# Patient Record
Sex: Female | Born: 1970
Health system: Southern US, Community
[De-identification: ages and names within clinical notes are randomized; demographics above are authoritative.]

## PROBLEM LIST (undated history)

## (undated) DIAGNOSIS — G4733 Obstructive sleep apnea (adult) (pediatric): Secondary | ICD-10-CM

## (undated) DIAGNOSIS — Z9889 Other specified postprocedural states: Secondary | ICD-10-CM

## (undated) DIAGNOSIS — M25562 Pain in left knee: Secondary | ICD-10-CM

## (undated) DIAGNOSIS — Z6841 Body Mass Index (BMI) 40.0 and over, adult: Secondary | ICD-10-CM

## (undated) DIAGNOSIS — Z8719 Personal history of other diseases of the digestive system: Secondary | ICD-10-CM

## (undated) DIAGNOSIS — R112 Nausea with vomiting, unspecified: Secondary | ICD-10-CM

## (undated) DIAGNOSIS — R5383 Other fatigue: Secondary | ICD-10-CM

## (undated) DIAGNOSIS — E559 Vitamin D deficiency, unspecified: Secondary | ICD-10-CM

## (undated) DIAGNOSIS — M25471 Effusion, right ankle: Secondary | ICD-10-CM

## (undated) DIAGNOSIS — I10 Essential (primary) hypertension: Secondary | ICD-10-CM

## (undated) DIAGNOSIS — E538 Deficiency of other specified B group vitamins: Secondary | ICD-10-CM

## (undated) DIAGNOSIS — F32A Depression, unspecified: Secondary | ICD-10-CM

## (undated) DIAGNOSIS — M25472 Effusion, left ankle: Secondary | ICD-10-CM

## (undated) DIAGNOSIS — R0602 Shortness of breath: Secondary | ICD-10-CM

## (undated) DIAGNOSIS — K219 Gastro-esophageal reflux disease without esophagitis: Secondary | ICD-10-CM

## (undated) DIAGNOSIS — M2550 Pain in unspecified joint: Secondary | ICD-10-CM

## (undated) DIAGNOSIS — M25561 Pain in right knee: Secondary | ICD-10-CM

## (undated) DIAGNOSIS — K829 Disease of gallbladder, unspecified: Secondary | ICD-10-CM

## (undated) DIAGNOSIS — K589 Irritable bowel syndrome without diarrhea: Secondary | ICD-10-CM

## (undated) DIAGNOSIS — M25475 Effusion, left foot: Secondary | ICD-10-CM

## (undated) DIAGNOSIS — L920 Granuloma annulare: Secondary | ICD-10-CM

## (undated) DIAGNOSIS — K5900 Constipation, unspecified: Secondary | ICD-10-CM

## (undated) DIAGNOSIS — R1310 Dysphagia, unspecified: Secondary | ICD-10-CM

## (undated) DIAGNOSIS — F419 Anxiety disorder, unspecified: Secondary | ICD-10-CM

## (undated) DIAGNOSIS — F329 Major depressive disorder, single episode, unspecified: Secondary | ICD-10-CM

## (undated) HISTORY — DX: Effusion, left ankle: M25.472

## (undated) HISTORY — DX: Obstructive sleep apnea (adult) (pediatric): G47.33

## (undated) HISTORY — DX: Pain in unspecified joint: M25.50

## (undated) HISTORY — DX: Gastro-esophageal reflux disease without esophagitis: K21.9

## (undated) HISTORY — DX: Essential (primary) hypertension: I10

## (undated) HISTORY — DX: Effusion, left foot: M25.475

## (undated) HISTORY — DX: Constipation, unspecified: K59.00

## (undated) HISTORY — DX: Shortness of breath: R06.02

## (undated) HISTORY — DX: Vitamin D deficiency, unspecified: E55.9

## (undated) HISTORY — DX: Pain in right knee: M25.562

## (undated) HISTORY — DX: Irritable bowel syndrome, unspecified: K58.9

## (undated) HISTORY — DX: Anxiety disorder, unspecified: F41.9

## (undated) HISTORY — DX: Deficiency of other specified B group vitamins: E53.8

## (undated) HISTORY — PX: BREAST SURGERY: SHX581

## (undated) HISTORY — PX: CYST REMOVAL HAND: SHX6279

## (undated) HISTORY — DX: Effusion, right ankle: M25.471

## (undated) HISTORY — DX: Dysphagia, unspecified: R13.10

## (undated) HISTORY — DX: Other fatigue: R53.83

## (undated) HISTORY — DX: Morbid (severe) obesity due to excess calories: E66.01

## (undated) HISTORY — DX: Disease of gallbladder, unspecified: K82.9

## (undated) HISTORY — DX: Body Mass Index (BMI) 40.0 and over, adult: Z684

## (undated) HISTORY — PX: KNEE ARTHROSCOPY: SUR90

## (undated) HISTORY — DX: Pain in right knee: M25.561

## (undated) HISTORY — PX: DIAGNOSTIC LAPAROSCOPY: SUR761

## (undated) HISTORY — DX: Depression, unspecified: F32.A

## (undated) HISTORY — DX: Major depressive disorder, single episode, unspecified: F32.9

## (undated) HISTORY — PX: DILATION AND CURETTAGE OF UTERUS: SHX78

---

## 2006-08-10 HISTORY — PX: BREAST REDUCTION SURGERY: SHX8

## 2009-04-17 ENCOUNTER — Ambulatory Visit: Payer: Self-pay | Admitting: Gastroenterology

## 2010-01-28 ENCOUNTER — Encounter: Admission: RE | Admit: 2010-01-28 | Discharge: 2010-01-28 | Payer: Self-pay | Admitting: Family Medicine

## 2010-03-10 HISTORY — PX: LAPAROSCOPIC CHOLECYSTECTOMY: SUR755

## 2010-03-22 ENCOUNTER — Observation Stay (HOSPITAL_COMMUNITY): Admission: EM | Admit: 2010-03-22 | Discharge: 2010-03-23 | Payer: Self-pay | Admitting: Pediatrics

## 2010-08-31 ENCOUNTER — Encounter: Payer: Self-pay | Admitting: General Surgery

## 2010-08-31 ENCOUNTER — Encounter: Payer: Self-pay | Admitting: Family Medicine

## 2010-10-24 LAB — DIFFERENTIAL
Basophils Relative: 0 % (ref 0–1)
Eosinophils Relative: 1 % (ref 0–5)
Lymphocytes Relative: 10 % — ABNORMAL LOW (ref 12–46)
Lymphs Abs: 1.2 10*3/uL (ref 0.7–4.0)
Monocytes Absolute: 1 10*3/uL (ref 0.1–1.0)
Neutrophils Relative %: 82 % — ABNORMAL HIGH (ref 43–77)

## 2010-10-24 LAB — CBC
MCH: 32.9 pg (ref 26.0–34.0)
MCV: 96.7 fL (ref 78.0–100.0)
Platelets: 215 10*3/uL (ref 150–400)
RBC: 4.16 MIL/uL (ref 3.87–5.11)
RDW: 13.6 % (ref 11.5–15.5)
WBC: 12.8 10*3/uL — ABNORMAL HIGH (ref 4.0–10.5)

## 2010-10-24 LAB — COMPREHENSIVE METABOLIC PANEL
Alkaline Phosphatase: 80 U/L (ref 39–117)
Calcium: 9.2 mg/dL (ref 8.4–10.5)
Chloride: 103 mEq/L (ref 96–112)
GFR calc Af Amer: >60 mL/min (ref >60)
GFR calc non Af Amer: >60 mL/min (ref >60)
Potassium: 3.4 mEq/L — ABNORMAL LOW (ref 3.5–5.1)

## 2010-10-24 LAB — URINE CULTURE

## 2010-10-24 LAB — URINALYSIS, ROUTINE W REFLEX MICROSCOPIC
Ketones, ur: >80 mg/dL — AB
Nitrite: NEGATIVE
Protein, ur: 30 mg/dL — AB
pH: 5.5 (ref 5.0–8.0)

## 2010-10-24 LAB — POCT PREGNANCY, URINE: Preg Test, Ur: NEGATIVE

## 2010-10-24 LAB — URINE MICROSCOPIC-ADD ON

## 2010-10-24 LAB — LIPASE, BLOOD: Lipase: 20 U/L (ref 11–59)

## 2011-02-10 ENCOUNTER — Other Ambulatory Visit: Payer: Self-pay | Admitting: Family Medicine

## 2011-02-10 DIAGNOSIS — Z1231 Encounter for screening mammogram for malignant neoplasm of breast: Secondary | ICD-10-CM

## 2011-02-18 ENCOUNTER — Encounter (INDEPENDENT_AMBULATORY_CARE_PROVIDER_SITE_OTHER): Payer: Self-pay | Admitting: General Surgery

## 2011-02-18 ENCOUNTER — Ambulatory Visit (INDEPENDENT_AMBULATORY_CARE_PROVIDER_SITE_OTHER): Payer: BC Managed Care – PPO | Admitting: General Surgery

## 2011-02-18 VITALS — BP 130/82 | Ht 67.0 in | Wt 246.1 lb

## 2011-02-18 DIAGNOSIS — IMO0002 Reserved for concepts with insufficient information to code with codable children: Secondary | ICD-10-CM

## 2011-02-18 DIAGNOSIS — M1710 Unilateral primary osteoarthritis, unspecified knee: Secondary | ICD-10-CM

## 2011-02-18 DIAGNOSIS — M179 Osteoarthritis of knee, unspecified: Secondary | ICD-10-CM

## 2011-02-18 DIAGNOSIS — IMO0001 Reserved for inherently not codable concepts without codable children: Secondary | ICD-10-CM

## 2011-02-18 DIAGNOSIS — E669 Obesity, unspecified: Secondary | ICD-10-CM

## 2011-02-18 DIAGNOSIS — M1990 Unspecified osteoarthritis, unspecified site: Secondary | ICD-10-CM | POA: Insufficient documentation

## 2011-02-18 NOTE — Patient Instructions (Addendum)
RETURN AFTER WORKUP COMPLETEDConstipation in Adults Constipation is having fewer than 2 bowel movements per week. Usually, the stools are hard. As we grow older, constipation is more common. If you try to fix constipation with laxatives, the problem may get worse. This is because laxatives taken over a long period of time make the colon muscles weaker. A low-fiber diet, not taking in enough fluids, and taking some medicines may make these problems worse. MEDICATIONS THAT MAY CAUSE CONSTIPATION  Water pills (diuretics).  Calcium channel blockers (used to control blood pressure and for the heart).   Certain pain medicines (narcotics).   Anticholinergics.  Anti-inflammatory agents.   Antacids that contain aluminum.   DISEASES THAT CONTRIBUTE TO CONSTIPATION  Diabetes.  Parkinson's disease.   Dementia.   Stroke.  Depression.   Illnesses that cause problems with salt and water metabolism.   HOME CARE INSTRUCTIONS  Constipation is usually best cared for without medicines. Increasing dietary fiber and eating more fruits and vegetables is the best way to manage constipation.   Slowly increase fiber intake to 25 to 38 grams per day. Whole grains, fruits, vegetables, and legumes are good sources of fiber. A dietitian can further help you incorporate high-fiber foods into your diet.   Drink enough water and fluids to keep your urine clear or pale yellow.   A fiber supplement may be added to your diet if you cannot get enough fiber from foods.   Increasing your activities also helps improve regularity.   Suppositories, as suggested by your caregiver, will also help. If you are using antacids, such as aluminum or calcium containing products, it will be helpful to switch to products containing magnesium if your caregiver says it is okay.   If you have been given a liquid injection (enema) today, this is only a temporary measure. It should not be relied on for treatment of longstanding  (chronic) constipation.   Stronger measures, such as magnesium sulfate, should be avoided if possible. This may cause uncontrollable diarrhea. Using magnesium sulfate may not allow you time to make it to the bathroom.  SEEK IMMEDIATE MEDICAL CARE IF:  There is bright red blood in the stool.   The constipation stays for more than 4 days.   There is belly (abdominal) or rectal pain.   You do not seem to be getting better.   You have any questions or concerns.  MAKE SURE YOU:  Understand these instructions.   Will watch your condition.   Will get help right away if you are not doing well or get worse.  Document Released: 04/24/2004 Document Re-Released: 10/21/2009 [facility] Patient Information 2011 [place], [place].

## 2011-02-18 NOTE — Progress Notes (Signed)
Subjective:     Patient ID: Lori Montgomery, female   DOB: 1971-04-23, 40 y.o.   MRN: [id number]    BP 130/82  Ht 5\' 7"  (1.702 m)  Wt 246 lb 2 oz (111.642 kg)  BMI 38.55 kg/m103    HPI 40 year old obese Caucasian female who I initially saw in May 2011 for consideration for weight loss surgery. By report her insurance provider at that time did not cover weight loss surgery procedures. She comes in today to discuss her ongoing problem with her weight control. She is still interested in laparoscopic adjustable gastric band surgery. She is still struggling with weight control. She most recently saw Dr. Mayford Knife for H CG shots as well as phentermine. She states that she lost about 26 pounds with that regimen; however, she regained all that weight back. Over the course of the past several years she has tried Weight Watchers, Doylene Bode, Slim fast with no long lasting success. She still struggles with bilateral ankle pain as well as lower back pain. She says that her joint pain has actually worsened since I saw her last year. She states that it is hard to exercise because of her joint pain. She has also undergone a laparoscopic cholecystectomy by Dr. gross for acute cholecystitis late last summer.  Past Medical History  Diagnosis Date  . Depression    Past Medical History  Diagnosis Date  . Depression   . Obesity (BMI 30-39.9)   . Degenerative joint disease    Past Surgical History  Procedure Date  . Breast reduction surgery 2008  . Laparoscopic cholecystectomy 03/2010    Dr Michaell Cowing   No Known Allergies  Current Outpatient Prescriptions  Medication Sig Dispense Refill  . ARIPiprazole (ABILIFY) 5 MG tablet Take 5 mg by mouth daily.        Marland Kitchen venlafaxine (EFFEXOR-XR) 150 MG 24 hr capsule Take 150 mg by mouth daily.        Marland Kitchen venlafaxine (EFFEXOR-XR) 75 MG 24 hr capsule Take 75 mg by mouth daily.          Review of Systems  Constitutional: Negative.   HENT: Negative.   Eyes: Negative.     Respiratory: Negative.  Negative for shortness of breath.   Cardiovascular: Negative.   Gastrointestinal: Positive for constipation (infrequent bowel movements). Negative for vomiting, abdominal pain, diarrhea and abdominal distention.  Genitourinary: Negative.   Musculoskeletal:       Right knee pain, b/l ankle pain  Skin: Negative.   Neurological: Negative.   Hematological: Negative.   Psychiatric/Behavioral: Negative.        Objective:   Physical Exam  Vitals reviewed. Constitutional: She is oriented to person, place, and time. She appears well-developed and well-nourished.  HENT:  Head: Normocephalic and atraumatic.  Eyes: Conjunctivae are normal. Pupils are equal, round, and reactive to light.  Neck: Normal range of motion. Neck supple. No tracheal deviation present. No thyromegaly present.  Cardiovascular: Normal rate, regular rhythm and normal heart sounds.   Pulmonary/Chest: Effort normal and breath sounds normal. She has no wheezes.  Abdominal: Soft. Bowel sounds are normal. She exhibits no distension. There is no tenderness. No hernia.    Musculoskeletal: Normal range of motion.  Neurological: She is alert and oriented to person, place, and time.  Skin: Skin is warm and dry. No rash noted. No erythema.  Psychiatric: She has a normal mood and affect. Her behavior is normal. Judgment and thought content normal.   Data reviewed: I reviewed my office  note from Dec 25, 2009. I reviewed Dr. gross his operative note from March 22, 2010. Also reviewed his last office note from August when he 4 2011. Also reviewed her initial referring letter for weight loss surgery in February 2011    Assessment:     40 year old obese Caucasian female with depression, degenerative joint disease, and history of dyslipidemia who is interested in laparoscopic adjustable gastric band surgery    Plan:     We discussed laparoscopic adjustable gastric band surgery in detail. We discussed the  procedure in detail. We discussed the risk and benefits of surgery including but not limited to bleeding, infection, injury to surrounding structures, blood clot formation, wound complications, band slippage, band erosion, worsening gastroesophageal reflux disease, esophageal dilatation, failure to loose weight, and port complications. We discussed that in order to be successful with weight loss surgery that she would also have to change her diet as well as her exercise level. We discussed that  it is essential to followup on a monthly basis after laparoscopic adjustable gastric band surgery to determine if she needs an adjustment to her band to help her with weight loss. We discussed that the typical expected weight loss with this type of surgery is 40-60% of excess body weight. We also discussed the distinct possibility that it is possible that she may lose less than 40% of her excess body weight. We also discussed the preoperative workup process which includes laboratory evaluation, upper GI, nutrition consultation, a referral to the psychologist, and a mammogram. Her mammogram is currently scheduled for next week. Once we get all these results we will submit her paperwork to her insurance agency for approval. We will base our followup on the outcome of her insurance agency's response.

## 2011-02-20 LAB — CBC
HCT: 40.2 % (ref 36.0–46.0)
Hemoglobin: 13.2 g/dL (ref 12.0–15.0)
Platelets: 218 10*3/uL (ref 150–400)
WBC: 6.1 10*3/uL (ref 4.0–10.5)

## 2011-02-20 LAB — LIPID PANEL
Cholesterol: 215 mg/dL — ABNORMAL HIGH (ref 0–200)
HDL: 55 mg/dL (ref >39)
Total CHOL/HDL Ratio: 3.9 Ratio
Triglycerides: 122 mg/dL (ref <150)

## 2011-02-20 LAB — COMPREHENSIVE METABOLIC PANEL
Alkaline Phosphatase: 54 U/L (ref 39–117)
BUN: 15 mg/dL (ref 6–23)
CO2: 28 mEq/L (ref 19–32)
Calcium: 9.5 mg/dL (ref 8.4–10.5)
Creat: 0.79 mg/dL (ref 0.50–1.10)
Potassium: 4.3 mEq/L (ref 3.5–5.3)
Sodium: 141 mEq/L (ref 135–145)
Total Protein: 6.3 g/dL (ref 6.0–8.3)

## 2011-02-23 ENCOUNTER — Ambulatory Visit
Admission: RE | Admit: 2011-02-23 | Discharge: 2011-02-23 | Disposition: A | Discharge status: Home / Self Care | Payer: BC Managed Care – PPO | Source: Ambulatory Visit | Attending: Family Medicine | Admitting: Family Medicine

## 2011-02-23 DIAGNOSIS — Z1231 Encounter for screening mammogram for malignant neoplasm of breast: Secondary | ICD-10-CM

## 2011-02-27 ENCOUNTER — Ambulatory Visit (HOSPITAL_COMMUNITY)
Admission: RE | Admit: 2011-02-27 | Discharge: 2011-02-27 | Disposition: A | Discharge status: Home / Self Care | Payer: BC Managed Care – PPO | Source: Ambulatory Visit | Attending: General Surgery | Admitting: General Surgery

## 2011-02-27 DIAGNOSIS — M545 Low back pain, unspecified: Secondary | ICD-10-CM | POA: Insufficient documentation

## 2011-02-27 DIAGNOSIS — IMO0001 Reserved for inherently not codable concepts without codable children: Secondary | ICD-10-CM

## 2011-02-27 DIAGNOSIS — K449 Diaphragmatic hernia without obstruction or gangrene: Secondary | ICD-10-CM | POA: Insufficient documentation

## 2011-02-27 DIAGNOSIS — F3289 Other specified depressive episodes: Secondary | ICD-10-CM | POA: Insufficient documentation

## 2011-02-27 DIAGNOSIS — F329 Major depressive disorder, single episode, unspecified: Secondary | ICD-10-CM | POA: Insufficient documentation

## 2011-02-27 DIAGNOSIS — M1990 Unspecified osteoarthritis, unspecified site: Secondary | ICD-10-CM | POA: Insufficient documentation

## 2011-02-27 DIAGNOSIS — Z6838 Body mass index (BMI) 38.0-38.9, adult: Secondary | ICD-10-CM | POA: Insufficient documentation

## 2011-03-04 DIAGNOSIS — F32A Depression, unspecified: Secondary | ICD-10-CM | POA: Insufficient documentation

## 2011-03-04 DIAGNOSIS — F329 Major depressive disorder, single episode, unspecified: Secondary | ICD-10-CM | POA: Insufficient documentation

## 2011-03-04 DIAGNOSIS — F419 Anxiety disorder, unspecified: Secondary | ICD-10-CM | POA: Insufficient documentation

## 2011-03-13 ENCOUNTER — Encounter: Payer: Self-pay | Admitting: *Deleted

## 2011-03-13 ENCOUNTER — Encounter: Payer: BC Managed Care – PPO | Attending: General Surgery | Admitting: *Deleted

## 2011-03-13 DIAGNOSIS — Z01818 Encounter for other preprocedural examination: Secondary | ICD-10-CM | POA: Insufficient documentation

## 2011-03-13 DIAGNOSIS — Z713 Dietary counseling and surveillance: Secondary | ICD-10-CM | POA: Insufficient documentation

## 2011-03-13 NOTE — Progress Notes (Signed)
  Patient was seen on 03/13/2011 for Pre-Operative LAGB Nutrition Assessment. Assessment and letter of approval faxed to [facility] Bariatric Surgery Program coordinator on 03/13/2011.    Patient to call for Pre-Op and Post-Op Nutrition Education at [facility] when surgery is scheduled.

## 2011-07-23 DIAGNOSIS — E669 Obesity, unspecified: Secondary | ICD-10-CM | POA: Insufficient documentation

## 2011-07-23 DIAGNOSIS — E66811 Obesity, class 1: Secondary | ICD-10-CM | POA: Insufficient documentation

## 2011-08-03 DIAGNOSIS — M25512 Pain in left shoulder: Secondary | ICD-10-CM | POA: Insufficient documentation

## 2012-01-27 ENCOUNTER — Ambulatory Visit (INDEPENDENT_AMBULATORY_CARE_PROVIDER_SITE_OTHER): Payer: BC Managed Care – PPO | Admitting: General Surgery

## 2012-02-05 ENCOUNTER — Ambulatory Visit (INDEPENDENT_AMBULATORY_CARE_PROVIDER_SITE_OTHER): Payer: BC Managed Care – PPO | Admitting: General Surgery

## 2012-02-05 ENCOUNTER — Encounter (INDEPENDENT_AMBULATORY_CARE_PROVIDER_SITE_OTHER): Payer: Self-pay | Admitting: General Surgery

## 2012-02-05 VITALS — BP 132/94 | HR 105 | Temp 97.8°F | Resp 16 | Ht 67.0 in | Wt 263.2 lb

## 2012-02-05 DIAGNOSIS — Z6841 Body Mass Index (BMI) 40.0 and over, adult: Secondary | ICD-10-CM

## 2012-02-08 LAB — CBC WITH DIFFERENTIAL/PLATELET
Basophils Absolute: 0 K/uL (ref 0.0–0.1)
Basophils Relative: 0 % (ref 0–1)
Eosinophils Absolute: 0.6 K/uL (ref 0.0–0.7)
Eosinophils Relative: 9 % — ABNORMAL HIGH (ref 0–5)
HCT: 40.8 % (ref 36.0–46.0)
Hemoglobin: 13.8 g/dL (ref 12.0–15.0)
Lymphocytes Relative: 35 % (ref 12–46)
Lymphs Abs: 2.2 K/uL (ref 0.7–4.0)
MCH: 31.2 pg (ref 26.0–34.0)
MCHC: 33.8 g/dL (ref 30.0–36.0)
MCV: 92.1 fL (ref 78.0–100.0)
Monocytes Absolute: 0.4 K/uL (ref 0.1–1.0)
Monocytes Relative: 6 % (ref 3–12)
Neutro Abs: 3.1 K/uL (ref 1.7–7.7)
Neutrophils Relative %: 50 % (ref 43–77)
Platelets: 239 K/uL (ref 150–400)
RBC: 4.43 MIL/uL (ref 3.87–5.11)
RDW: 13.3 % (ref 11.5–15.5)
WBC: 6.3 K/uL (ref 4.0–10.5)

## 2012-02-08 LAB — TSH: TSH: 2.77 u[IU]/mL (ref 0.350–4.500)

## 2012-02-08 NOTE — Progress Notes (Signed)
Patient ID: Lori Montgomery, female   DOB: 03-04-1971, 41 y.o.   MRN: [id number]  Chief Complaint  Patient presents with  . Other    new bariatric- lap band initial    HPI Lori Montgomery is a 41 y.o. female.   HPI 41 year old Caucasian female comes back in to rediscuss weight loss surgery. I initially saw her back in 2011 for consideration of laparoscopic adjustable gastric band surgery. The patient states that she's been in her usual state of health. However her primary care physician did notice a lump in her left breast last week and has requested that her yearly mammogram be moved up. She is currently waiting to hear what is going to be scheduled for. She has no breast complaints per se.  Otherwise she denies any significant medical changes since I saw her last summer in July. Over the years she has tried many different things for weight loss all of which have been unsuccessful for the long-term. She has tried Doylene Bode, [employer] Watchers, Phentermine, as well as hCG injections.  Past Medical History  Diagnosis Date  . Depression   . Obesity (BMI 30-39.9)   . Degenerative joint disease   . Gout     Past Surgical History  Procedure Date  . Breast reduction surgery 2008  . Laparoscopic cholecystectomy 03/2010    Dr Michaell Cowing  . Knee arthroscopy     right knee    Family History  Problem Relation Age of Onset  . Diabetes Mother   . Lung cancer Maternal Grandmother   . Breast cancer Maternal Aunt   . Diabetes Father   . Heart disease Paternal Grandmother   . Heart disease Paternal Grandfather     Social History History  Substance Use Topics  . Smoking status: Former Smoker    Quit date: 01/23/2011  . Smokeless tobacco: Not on file  . Alcohol Use: Yes     rarely    No Known Allergies  Current Outpatient Prescriptions  Medication Sig Dispense Refill  . ARIPiprazole (ABILIFY) 5 MG tablet Take 5 mg by mouth daily.        . hydrochlorothiazide (MICROZIDE) 12.5 MG capsule  daily.      Marland Kitchen venlafaxine (EFFEXOR-XR) 150 MG 24 hr capsule Take 150 mg by mouth daily.        Marland Kitchen venlafaxine (EFFEXOR-XR) 75 MG 24 hr capsule Take 75 mg by mouth daily.          Review of Systems Review of Systems  Constitutional: Negative for fever, chills and unexpected weight change.  HENT: Negative for hearing loss, congestion, sore throat, trouble swallowing and voice change.   Eyes: Negative for visual disturbance.  Respiratory: Negative for cough, shortness of breath and wheezing.        No CP, SOB, DOE, PND, orthopnea  Cardiovascular: Negative for chest pain, palpitations and leg swelling.       Some intermittent swelling in feet  Gastrointestinal: Negative for nausea, vomiting, abdominal pain, diarrhea, constipation, blood in stool, abdominal distention and anal bleeding.  Genitourinary: Negative for hematuria, vaginal bleeding and difficulty urinating.  Musculoskeletal: Negative for arthralgias.       Rt knee pain  Skin: Negative for rash and wound.  Neurological: Negative for seizures, syncope, light-headedness, numbness and headaches.  Hematological: Negative for adenopathy. Does not bruise/bleed easily.  Psychiatric/Behavioral: Negative for confusion.       Takes antidepressant    Blood pressure 132/94, pulse 105, temperature 97.8 F (36.6 C), temperature  source Temporal, resp. rate 16, height 5\' 7"  (1.702 m), weight 263 lb 3.2 oz (119.387 kg). Wt last July 246 pounds  Physical Exam Physical Exam  Vitals reviewed. Constitutional: She is oriented to person, place, and time. She appears well-developed and well-nourished. No distress.  HENT:  Head: Normocephalic and atraumatic.  Right Ear: External ear normal.  Eyes: Conjunctivae are normal. No scleral icterus.  Neck: Normal range of motion. Neck supple. No tracheal deviation present. No thyromegaly present.  Cardiovascular: Normal rate, regular rhythm, normal heart sounds and intact distal pulses.   Pulmonary/Chest:  Effort normal and breath sounds normal. No respiratory distress. She has no wheezes. Right breast exhibits no inverted nipple, no nipple discharge, no skin change and no tenderness. Left breast exhibits no inverted nipple, no nipple discharge, no skin change and no tenderness. Breasts are symmetrical.       Has typical fibrocystic breast exam in L breast - area of thickness in Left outer quadrant  Abdominal: Soft. Bowel sounds are normal. She exhibits no distension. There is no tenderness. There is no rebound.    Musculoskeletal: Normal range of motion. She exhibits no edema and no tenderness.       Rt knee surgical scar  Lymphadenopathy:    She has no cervical adenopathy.  Neurological: She is alert and oriented to person, place, and time. She exhibits normal muscle tone.  Skin: Skin is warm and dry. No rash noted. She is not diaphoretic. No erythema.  Psychiatric: She has a normal mood and affect. Her behavior is normal. Judgment and thought content normal.    Data Reviewed My previous office notes from 02/18/11 & 12/2009 [facility] 02/2011: small sliding hiatal hernia Labs from 02/2011- nml cmet, cbc, tsh, LDL 136, total chol 215  Assessment    Morbid obesity BMI 41.22 Elevated Cholesterol Hypertriglyceridemia DJD  - Rt knee    Plan    The patient meets weight loss surgery criteria. I think the patient would be an acceptable candidate for Laparoscopic adjustable gastric band placement.  We re-discussed laparoscopic adjustable gastric banding. The patient was given Agricultural engineer. We discussed the risk and benefits of surgery including but not limited to bleeding, infection, injury to surrounding structures, blood clot formation such as deep venous thrombosis or pulmonary embolism, need to convert to an open procedure, band slippage, band erosion, failure to loose weight, port complications (leak or flippage), potential need for reoperative surgery, esophageal dilatation, worsening  reflux, and vitamin deficiencies. We discussed the typical post operative recovery course. We discussed that their postoperative diet will be modified for several weeks. We specifically talked about the need to be on a liquid diet for one to 2 weeks after surgery. We also discussed the typical postoperative course with a laparoscopic adjustable gastric band and the need for frequent postoperative visits to assess the volume status of the band.  We discussed the typical expected weight loss with a laparoscopic adjustable gastric band. I explained to the patient that they can expect to lose 40-60% of their excess body weight if they are compliant with their postoperative instructions. However I did explain that some patients loose less than 40% and some patients lose more than 60% of their excess body weight.  I explained that the likelihood of improvement in their obesity is good.  I explained to the patient that we will start our evaluation process which includes labs, EKG, CXR. Since the patient has already been cleared the psychiatrist & the nutritionist last year -  will not repeat these consultations. The patient will need an up to date mammogram which she is scheduled to get soon. If she is approved, she will need to see the nutritionist for preop teaching.   [name]. Andrey Campanile, MD, FACS General, Bariatric, & Minimally Invasive Surgery [facility], [place]          [facility] M 02/08/2012, 2:00 PM

## 2012-02-09 LAB — COMPREHENSIVE METABOLIC PANEL
AST: 15 U/L (ref 0–37)
Albumin: 4 g/dL (ref 3.5–5.2)
Alkaline Phosphatase: 56 U/L (ref 39–117)
BUN: 12 mg/dL (ref 6–23)
CO2: 24 mEq/L (ref 19–32)
Calcium: 9.2 mg/dL (ref 8.4–10.5)
Chloride: 104 mEq/L (ref 96–112)
Creat: 0.72 mg/dL (ref 0.50–1.10)
Sodium: 139 mEq/L (ref 135–145)
Total Bilirubin: 0.4 mg/dL (ref 0.3–1.2)
Total Protein: 6.7 g/dL (ref 6.0–8.3)

## 2012-02-09 LAB — LIPID PANEL: Cholesterol: 205 mg/dL — ABNORMAL HIGH (ref 0–200)

## 2012-02-10 ENCOUNTER — Other Ambulatory Visit (INDEPENDENT_AMBULATORY_CARE_PROVIDER_SITE_OTHER): Payer: Self-pay | Admitting: General Surgery

## 2012-03-03 ENCOUNTER — Encounter (INDEPENDENT_AMBULATORY_CARE_PROVIDER_SITE_OTHER): Payer: Self-pay

## 2012-08-11 ENCOUNTER — Encounter (INDEPENDENT_AMBULATORY_CARE_PROVIDER_SITE_OTHER): Payer: Self-pay | Admitting: General Surgery

## 2012-08-11 ENCOUNTER — Ambulatory Visit (INDEPENDENT_AMBULATORY_CARE_PROVIDER_SITE_OTHER): Payer: BC Managed Care – PPO | Admitting: General Surgery

## 2012-08-11 VITALS — BP 118/72 | HR 76 | Resp 18 | Ht 67.75 in | Wt 264.0 lb

## 2012-08-11 DIAGNOSIS — F3289 Other specified depressive episodes: Secondary | ICD-10-CM

## 2012-08-11 DIAGNOSIS — Z6841 Body Mass Index (BMI) 40.0 and over, adult: Secondary | ICD-10-CM

## 2012-08-11 DIAGNOSIS — M109 Gout, unspecified: Secondary | ICD-10-CM

## 2012-08-11 DIAGNOSIS — F329 Major depressive disorder, single episode, unspecified: Secondary | ICD-10-CM

## 2012-08-11 NOTE — Patient Instructions (Signed)
We will get a copy of your labs from Dr Kathrynn Running Try to get a copy of your knee surgery operative note

## 2012-08-11 NOTE — Progress Notes (Addendum)
Patient ID: Lori Montgomery, female   DOB: August 20, 1970, 42 y.o.   MRN: [id number]  Chief Complaint  Patient presents with  . Weight Loss Surgery    lap band     HPI Lori Montgomery is a 42 y.o. female.   HPI 42 year old Caucasian female who is morbidly obese comes in to rediscuss laparoscopic adjustable gastric band surgery. I have seen on several occasions to discuss weight loss surgery. Unfortunately she has been denied by her insurance provider. She states that she has had worsening right knee as well as worsening left ankle pain since she was seen in the summer. She states that her primary care physician instructed her to stop exercising and going to the gym because of the worsening musculoskeletal pain. She also states that she had a recent physical in mid December and was told that her cholesterol level is worsening. Last summer it was 29 and now it is 218. She denies any trips to the emergency room or hospitalization since last summer. She denies any abdominal pain, nausea, vomiting, reflux. She denies any diarrhea or constipation. She denies any chest pain or shortness of breath. She has still been following a low calorie diet but still has not lost any weight since she was seen last summer. Past Medical History  Diagnosis Date  . Depression   . Degenerative joint disease   . Gout   . Morbid obesity with BMI of 40.0-44.9, adult     Past Surgical History  Procedure Date  . Breast reduction surgery 2008  . Laparoscopic cholecystectomy 03/2010    Dr Michaell Cowing  . Knee arthroscopy     right knee    Family History  Problem Relation Age of Onset  . Diabetes Mother   . Lung cancer Maternal Grandmother   . Breast cancer Maternal Aunt   . Diabetes Father   . Heart disease Paternal Grandmother   . Heart disease Paternal Grandfather     Social History History  Substance Use Topics  . Smoking status: Former Smoker    Quit date: 01/23/2011  . Smokeless tobacco: Not on file  . Alcohol  Use: Yes     Comment: rarely    No Known Allergies  Current Outpatient Prescriptions  Medication Sig Dispense Refill  . ARIPiprazole (ABILIFY) 5 MG tablet Take 5 mg by mouth daily.        Marland Kitchen venlafaxine (EFFEXOR-XR) 150 MG 24 hr capsule Take 150 mg by mouth daily.        Marland Kitchen venlafaxine (EFFEXOR-XR) 75 MG 24 hr capsule Take 75 mg by mouth daily.          Review of Systems Review of Systems  Constitutional: Negative for fever, chills and unexpected weight change.  HENT: Negative for hearing loss, congestion, sore throat, trouble swallowing and voice change.   Eyes: Negative for visual disturbance.  Respiratory: Negative for cough and wheezing.   Cardiovascular: Negative for chest pain, palpitations and leg swelling.  Gastrointestinal: Negative for nausea, vomiting, abdominal pain, diarrhea, constipation, blood in stool, abdominal distention and anal bleeding.       Takes correctal daily  Genitourinary: Negative for hematuria, vaginal bleeding and difficulty urinating.  Musculoskeletal: Negative for arthralgias.       Rt knee pain and left ankle pain  Skin: Negative for rash and wound.  Neurological: Negative for seizures, syncope and headaches.  Hematological: Negative for adenopathy. Does not bruise/bleed easily.  Psychiatric/Behavioral: Negative for confusion.    Blood pressure 118/72, pulse  76, resp. rate 18, height 5' 7.75" (1.721 m), weight 264 lb (119.75 kg).  Physical Exam Physical Exam  Vitals reviewed. Constitutional: She is oriented to person, place, and time. She appears well-developed and well-nourished. No distress.  HENT:  Head: Normocephalic and atraumatic.  Right Ear: External ear normal.  Left Ear: External ear normal.  Eyes: Conjunctivae normal are normal. No scleral icterus.  Neck: Normal range of motion. Neck supple. No tracheal deviation present. No thyromegaly present.  Cardiovascular: Normal rate and regular rhythm.   Pulmonary/Chest: Effort normal and  breath sounds normal. No respiratory distress. She has no wheezes.  Abdominal: Soft. Bowel sounds are normal. She exhibits no distension. There is no tenderness. There is no rebound.       Well healed trocar incisions  Musculoskeletal: She exhibits no edema and no tenderness.       Right knee: She exhibits bony tenderness.  Neurological: She is alert and oriented to person, place, and time. She exhibits normal muscle tone.  Skin: Skin is warm and dry. No rash noted. She is not diaphoretic. No erythema. No pallor.  Psychiatric: She has a normal mood and affect. Her behavior is normal. Judgment and thought content normal.    Data Reviewed My office notes Labs from July 2013 total chol 205; LDL 134 Labs from 07/25/12 - nml cbc, cmet; total chol 218, LDL now 146  Assessment    Morbid obesity BMI 40.44 Hypercholesterolemia Hyperlipidemia Depression Joint pain (Right knee and Left ankle)    Plan    I still believe this patient would benefit from weight loss surgery. She has documented weight demonstrating her morbid obesity. Despite numerous attempts at sustained weight loss which has included dietary programs such as Doylene Bode and Weight Watchers as well as medications such as phentermine and hCG injection she has not lost any weight and/or sustained weight loss. She has worsening joint pain as well as worsening cholesterol levels that will best be managed by weight loss surgery. Her joint pain is worsening to the point that she is no longer able to exercise regularly without suffering severe joint pain in her right knee and left ankle. The best treatment course for her joint pain will be weight loss which will be best achieved through weight loss surgery.We will obtain her most recent labs from her physical and order any of the remaining labs. We briefly rediscuss laparoscopic adjustable gastric band surgery. All of her questions were asked and answered.  [name]. Andrey Campanile, MD, FACS General,  Bariatric, & Minimally Invasive Surgery [facility], [place]        [facility] M 08/11/2012, 1:50 PM

## 2012-08-30 ENCOUNTER — Other Ambulatory Visit (INDEPENDENT_AMBULATORY_CARE_PROVIDER_SITE_OTHER): Payer: Self-pay | Admitting: General Surgery

## 2012-09-15 ENCOUNTER — Encounter: Payer: BC Managed Care – PPO | Attending: General Surgery | Admitting: *Deleted

## 2012-09-15 VITALS — Ht 67.0 in | Wt 272.2 lb

## 2012-09-15 DIAGNOSIS — Z01818 Encounter for other preprocedural examination: Secondary | ICD-10-CM | POA: Insufficient documentation

## 2012-09-15 DIAGNOSIS — Z713 Dietary counseling and surveillance: Secondary | ICD-10-CM | POA: Insufficient documentation

## 2012-09-16 ENCOUNTER — Encounter: Payer: Self-pay | Admitting: *Deleted

## 2012-09-16 NOTE — Progress Notes (Addendum)
Bariatric Class:  Appt start time: 1730 end time:  1830.  Pre-Operative Nutrition Class  Patient was seen on 09/15/12 for Pre-Operative Bariatric Surgery Education at [facility].   Surgery date: 10/04/12 Surgery type: LAGB [facility] (03/13/11)  Weight today: 272.2 lbs BMI: 42.6 kg/m^2  Samples given per MNT protocol: 1 each Bariatric Advantage Multivitamin Lot # [id number]; Exp: 06/15  Bariatric Advantage Calcium Citrate Lot # [id number]; Exp:10/15  Bariatric Advantage Sublingual B12 Lot # [id number]; Exp:10/15  Celebrate Vitamins Multivitamin Complete (w/ iron) Lot # [id number]; Exp: 11/14  Celebrate Vitamins Multivitamin Lot # [id number]; Exp: 07/15  Celebrate Vitamins Iron + C (30 mg) Lot # [id number]; Exp: 09/15  Celebrate Vitamins Calcium Citrate Lot # [id number]; Exp: 09/15  Unjury Protein Powder Lot # [id number]; Exp: 03/15  Premier Protein Shake Lot # [id number]; Exp: 06/17/13  The following the learning objective met by the patient during this course:  Identifies Pre-Op Dietary Goals and will begin 2 weeks pre-operatively  Identifies appropriate sources of fluids and proteins   States protein recommendations and appropriate sources pre and post-operatively  Identifies Post-Operative Dietary Goals and will follow for 2 weeks post-operatively  Identifies appropriate multivitamin and calcium sources  Describes the need for physical activity post-operatively and will follow MD recommendations  States when to call healthcare provider regarding medication questions or post-operative complications  Handouts given during class include:  Pre-Op Bariatric Surgery Diet Handout  Protein Shake Handout  Post-Op Bariatric Surgery Nutrition Handout  BELT Program Information Flyer  Support Group Information Flyer  [facility] Bariatric Supplements Price List  Follow-Up Plan: Patient will follow-up at [facility] 2 weeks post  operatively for diet advancement per MD.

## 2012-09-16 NOTE — Patient Instructions (Signed)
Follow:   Pre-Op Diet per MD 2 weeks prior to surgery  Phase 2- Liquids (clear/full) 2 weeks after surgery  Vitamin/Mineral/Calcium guidelines for purchasing bariatric supplements  Exercise guidelines pre and post-op per MD  Follow-up at [facility] in 2 weeks post-op for diet advancement. Contact Shanera Meske as needed with questions/concerns. 

## 2012-09-20 ENCOUNTER — Encounter (HOSPITAL_COMMUNITY): Payer: Self-pay | Admitting: Pharmacy Technician

## 2012-09-21 ENCOUNTER — Telehealth (INDEPENDENT_AMBULATORY_CARE_PROVIDER_SITE_OTHER): Payer: Self-pay | Admitting: General Surgery

## 2012-09-21 NOTE — Telephone Encounter (Signed)
Patient called stating she was transferred to triage office because she wanted to know if she was supposed to keep her appointment tomorrow due to the pending weather. Patient stated that she wanted to know if our office would be open or not. I advised the patient that we have not been advised yet, but that I would check with Dr. Andrey Campanile or his assistant and she would be called back. I called Jade to advise of the patient concern. She advised the patient keep the appointment.  I called the patient back and advised her that as of right now the clinic will be open and for her to keep the appointment as scheduled. Patient agreed.

## 2012-09-22 ENCOUNTER — Ambulatory Visit (INDEPENDENT_AMBULATORY_CARE_PROVIDER_SITE_OTHER): Payer: BC Managed Care – PPO | Admitting: General Surgery

## 2012-09-26 ENCOUNTER — Encounter (HOSPITAL_COMMUNITY)
Admission: RE | Admit: 2012-09-26 | Discharge: 2012-09-26 | Disposition: A | Discharge status: Home / Self Care | Payer: BC Managed Care – PPO | Source: Ambulatory Visit | Attending: General Surgery | Admitting: General Surgery

## 2012-09-26 ENCOUNTER — Encounter (HOSPITAL_COMMUNITY): Payer: Self-pay

## 2012-09-26 HISTORY — DX: Personal history of other diseases of the digestive system: Z87.19

## 2012-09-26 HISTORY — DX: Other specified postprocedural states: Z98.890

## 2012-09-26 HISTORY — DX: Nausea with vomiting, unspecified: R11.2

## 2012-09-26 LAB — COMPREHENSIVE METABOLIC PANEL
BUN: 16 mg/dL (ref 6–23)
CO2: 26 mEq/L (ref 19–32)
Calcium: 9.1 mg/dL (ref 8.4–10.5)
Chloride: 99 mEq/L (ref 96–112)
Creatinine, Ser: 0.7 mg/dL (ref 0.50–1.10)
GFR calc Af Amer: >90 mL/min (ref >90)
GFR calc non Af Amer: >90 mL/min (ref >90)
Total Bilirubin: 0.3 mg/dL (ref 0.3–1.2)

## 2012-09-26 LAB — CBC WITH DIFFERENTIAL/PLATELET
Eosinophils Relative: 6 % — ABNORMAL HIGH (ref 0–5)
HCT: 41.9 % (ref 36.0–46.0)
Hemoglobin: 13.6 g/dL (ref 12.0–15.0)
Lymphocytes Relative: 31 % (ref 12–46)
MCHC: 32.5 g/dL (ref 30.0–36.0)
MCV: 93.3 fL (ref 78.0–100.0)
Monocytes Absolute: 0.6 10*3/uL (ref 0.1–1.0)
Monocytes Relative: 6 % (ref 3–12)
Neutro Abs: 5.8 10*3/uL (ref 1.7–7.7)

## 2012-09-26 LAB — SURGICAL PCR SCREEN
MRSA, PCR: NEGATIVE
Staphylococcus aureus: NEGATIVE

## 2012-09-26 NOTE — Patient Instructions (Addendum)
20 APRILLE SAWHNEY  09/26/2012   Your procedure is scheduled on:  10/04/12  TUESDAY  Report to [facility] at   0515    AM.  Call this number if you have problems the morning of surgery: [phone]       Remember:   Do not eat food  Or drink :After Midnight. Monday NIGHT   Take these medicines the morning of surgery with A SIP OF WATER: ABILIFY, EFFEXOR   .  Contacts, dentures or partial plates can not be worn to surgery  Leave suitcase in the car. After surgery it may be brought to your room.  For patients admitted to the hospital, checkout time is 11:00 AM day of  discharge.             SPECIAL INSTRUCTIONS- SEE [facility] PREPARING FOR SURGERY INSTRUCTION SHEET-     DO NOT WEAR JEWELRY, LOTIONS, POWDERS, OR PERFUMES.  WOMEN-- DO NOT SHAVE LEGS OR UNDERARMS FOR 12 HOURS BEFORE SHOWERS. MEN MAY SHAVE FACE.  Patients discharged the day of surgery will not be allowed to drive home. IF going home the day of surgery, you must have a driver and someone to stay with you for the first 24 hours  Name and phone number of your driver:   Husband   KELTIE LABELL                                                                     Please read over the following fact sheets that you were given: MRSA Information, Incentive Spirometry Sheet, Blood Transfusion Sheet  Information                                                                                   Kean Gautreau  PST 336  [phone]                 FAILURE TO FOLLOW THESE INSTRUCTIONS MAY RESULT IN  CANCELLATION   OF YOUR SURGERY                                                  Patient Signature _____________________________

## 2012-09-26 NOTE — Progress Notes (Signed)
EKG 6/13 on chart

## 2012-09-27 ENCOUNTER — Encounter (INDEPENDENT_AMBULATORY_CARE_PROVIDER_SITE_OTHER): Payer: Self-pay | Admitting: General Surgery

## 2012-09-27 ENCOUNTER — Ambulatory Visit (INDEPENDENT_AMBULATORY_CARE_PROVIDER_SITE_OTHER): Payer: BC Managed Care – PPO | Admitting: General Surgery

## 2012-09-27 VITALS — BP 126/84 | HR 72 | Temp 98.0°F | Resp 14 | Ht 67.0 in | Wt 267.0 lb

## 2012-09-27 NOTE — Patient Instructions (Signed)
Keep up with the preop diet See you next week

## 2012-09-27 NOTE — Progress Notes (Signed)
Patient ID: Lori Montgomery, female   DOB: 30-Oct-1970, 42 y.o.   MRN: [id number]  Chief Complaint  Patient presents with  . Bariatric Pre-op    HPI RENISHA Montgomery is a 42 y.o. female.   HPI 42 year old Caucasian female who is morbidly obese comes in for her preop appt for  laparoscopic adjustable gastric band surgery. I have seen on several occasions to discuss weight loss surgery. She has been approved by her insurance provider. She states that she has had worsening right knee as well as worsening left ankle pain since she was seen in the summer. She states that her primary care physician instructed her to stop exercising and going to the gym because of the worsening musculoskeletal pain. She also states that she had a recent physical in mid December and was told that her cholesterol level is worsening. Last summer it was 3 and now it is 218. She denies any trips to the emergency room or hospitalization since last summer. She denies any abdominal pain, nausea, vomiting, reflux. She denies any diarrhea or constipation. She denies any chest pain or shortness of breath. She has still been following a low calorie diet but still has not lost any weight since she was seen last summer.  No new changes since seen in Jan 2014.   PMHx, PSHx, SOCHx, FAMHx, ALL reviewed and unchanged   Past Medical History  Diagnosis Date  . Depression   . Degenerative joint disease   . Gout   . Morbid obesity with BMI of 40.0-44.9, adult   . H/O hiatal hernia   . PONV (postoperative nausea and vomiting)     states also had localized reaction to IV meds during/following endoscopy 2011 requiring IV Benadryl- ? location of test    Past Surgical History  Procedure Laterality Date  . Breast reduction surgery  2008    with scar revision  . Laparoscopic cholecystectomy  03/2010    Dr Michaell Cowing  . Knee arthroscopy      right knee/ with ACL REPAIR    Family History  Problem Relation Age of Onset  . Diabetes Mother    . Lung cancer Maternal Grandmother   . Breast cancer Maternal Aunt   . Diabetes Father   . Heart disease Paternal Grandmother   . Heart disease Paternal Grandfather     Social History History  Substance Use Topics  . Smoking status: Former Smoker    Quit date: 01/23/2011  . Smokeless tobacco: Never Used  . Alcohol Use: Yes     Comment: rarely    No Known Allergies  Current Outpatient Prescriptions  Medication Sig Dispense Refill  . ARIPiprazole (ABILIFY) 5 MG tablet Take 5 mg by mouth every morning.       . bisacodyl (CORRECTIVE LAXATIVE) 5 MG EC tablet Take 5 mg by mouth daily.       Marland Kitchen venlafaxine (EFFEXOR-XR) 150 MG 24 hr capsule Take 150 mg by mouth every morning. Take with 75 mg to make 225 mg      . venlafaxine (EFFEXOR-XR) 75 MG 24 hr capsule Take 75 mg by mouth every morning. Take with 150 mg to make 225 mg       No current facility-administered medications for this visit.    Review of Systems Review of Systems  Constitutional: Negative for fever, chills and unexpected weight change.  HENT: Negative for hearing loss, congestion, sore throat, trouble swallowing and voice change.   Eyes: Negative for visual disturbance.  Respiratory:  Negative for cough and wheezing.   Cardiovascular: Negative for chest pain, palpitations and leg swelling.  Gastrointestinal: Negative for nausea, vomiting, abdominal pain, diarrhea, blood in stool, abdominal distention and anal bleeding.       Denies reflux  Genitourinary: Negative for hematuria, vaginal bleeding and difficulty urinating.  Musculoskeletal: Negative for arthralgias.       Joint pain  Skin: Negative for rash and wound.  Neurological: Negative for seizures, syncope and headaches.  Hematological: Negative for adenopathy. Does not bruise/bleed easily.  Psychiatric/Behavioral: Negative for confusion.    Blood pressure 126/84, pulse 72, temperature 98 F (36.7 C), temperature source Temporal, resp. rate 14, height 5\' 7"   (1.702 m), weight 267 lb (121.11 kg), last menstrual period 09/21/2012.  Physical Exam Physical Exam  Vitals reviewed. Constitutional: She is oriented to person, place, and time. She appears well-developed and well-nourished. No distress.  HENT:  Head: Normocephalic and atraumatic.  Right Ear: External ear normal.  Left Ear: External ear normal.  Eyes: Conjunctivae are normal. No scleral icterus.  Neck: Normal range of motion. Neck supple. No tracheal deviation present. No thyromegaly present.  Cardiovascular: Normal rate, regular rhythm and normal heart sounds.   Pulmonary/Chest: Effort normal and breath sounds normal. No respiratory distress. She has no wheezes.  Abdominal: Soft. She exhibits no distension. There is no tenderness. There is no rebound.  Well healed trocar site  Musculoskeletal: She exhibits no edema.  Neurological: She is alert and oriented to person, place, and time.  Skin: Skin is warm and dry. No rash noted. She is not diaphoretic. No erythema.  Psychiatric: She has a normal mood and affect. Her behavior is normal. Judgment and thought content normal.    Data Reviewed My note  UGI - small sliding hiatal hernia Preop labs  Assessment    Morbid obesity BMI 41.8  Hypercholesterolemia  Hyperlipidemia  Depression  Joint pain (Right knee and Left ankle) Small sliding hiatal hernia     Plan    The patient is scheduled for laparoscopic adjustable gastric band placement with possible hiatal hernia repair next Tuesday. I discussed the potential for hiatal hernia repair with the patient. All of her questions were asked and answered. We reviewed her preoperative imaging as well as her preoperative labs. She was encouraged to continue with her preoperative diet. I will see her next Tuesday   [name]. Andrey Campanile, MD, FACS General, Bariatric, & Minimally Invasive Surgery [facility] Surgery, [place]        [facility] M 09/27/2012, 4:48 PM

## 2012-10-04 ENCOUNTER — Ambulatory Visit (HOSPITAL_COMMUNITY): Payer: BC Managed Care – PPO | Admitting: Anesthesiology

## 2012-10-04 ENCOUNTER — Encounter (HOSPITAL_COMMUNITY): Payer: Self-pay | Admitting: *Deleted

## 2012-10-04 ENCOUNTER — Ambulatory Visit (HOSPITAL_COMMUNITY)
Admission: RE | Admit: 2012-10-04 | Discharge: 2012-10-04 | Disposition: A | Discharge status: Home / Self Care | Payer: BC Managed Care – PPO | Source: Ambulatory Visit | Attending: General Surgery | Admitting: General Surgery

## 2012-10-04 ENCOUNTER — Encounter (HOSPITAL_COMMUNITY): Payer: Self-pay | Admitting: Anesthesiology

## 2012-10-04 ENCOUNTER — Encounter (HOSPITAL_COMMUNITY)
Admission: RE | Disposition: A | Discharge status: Home / Self Care | Payer: Self-pay | Source: Ambulatory Visit | Attending: General Surgery

## 2012-10-04 ENCOUNTER — Ambulatory Visit (HOSPITAL_COMMUNITY): Payer: BC Managed Care – PPO

## 2012-10-04 DIAGNOSIS — F329 Major depressive disorder, single episode, unspecified: Secondary | ICD-10-CM | POA: Insufficient documentation

## 2012-10-04 DIAGNOSIS — K449 Diaphragmatic hernia without obstruction or gangrene: Secondary | ICD-10-CM

## 2012-10-04 DIAGNOSIS — E7800 Pure hypercholesterolemia, unspecified: Secondary | ICD-10-CM | POA: Insufficient documentation

## 2012-10-04 DIAGNOSIS — E785 Hyperlipidemia, unspecified: Secondary | ICD-10-CM

## 2012-10-04 DIAGNOSIS — F3289 Other specified depressive episodes: Secondary | ICD-10-CM | POA: Insufficient documentation

## 2012-10-04 DIAGNOSIS — M25569 Pain in unspecified knee: Secondary | ICD-10-CM | POA: Insufficient documentation

## 2012-10-04 DIAGNOSIS — Z6841 Body Mass Index (BMI) 40.0 and over, adult: Secondary | ICD-10-CM | POA: Insufficient documentation

## 2012-10-04 DIAGNOSIS — Z01812 Encounter for preprocedural laboratory examination: Secondary | ICD-10-CM | POA: Insufficient documentation

## 2012-10-04 DIAGNOSIS — M25579 Pain in unspecified ankle and joints of unspecified foot: Secondary | ICD-10-CM | POA: Insufficient documentation

## 2012-10-04 DIAGNOSIS — Z79899 Other long term (current) drug therapy: Secondary | ICD-10-CM | POA: Insufficient documentation

## 2012-10-04 HISTORY — PX: LAPAROSCOPIC GASTRIC BANDING: SHX1100

## 2012-10-04 HISTORY — PX: MESH APPLIED TO LAP PORT: SHX5969

## 2012-10-04 SURGERY — GASTRIC BANDING, LAPAROSCOPIC
Anesthesia: General | Site: Esophagus | Wound class: Clean

## 2012-10-04 MED ORDER — ACETAMINOPHEN 10 MG/ML IV SOLN
INTRAVENOUS | Status: DC | PRN
  Administered 2012-10-04: 1000 mg via INTRAVENOUS

## 2012-10-04 MED ORDER — HEPARIN SODIUM (PORCINE) 5000 UNIT/ML IJ SOLN
5000.0000 [IU] | INTRAMUSCULAR | Status: AC
  Administered 2012-10-04: 5000 [IU] via SUBCUTANEOUS
  Filled 2012-10-04: qty 1

## 2012-10-04 MED ORDER — BUPIVACAINE-EPINEPHRINE 0.25% -1:200000 IJ SOLN
INTRAMUSCULAR | Status: AC
  Filled 2012-10-04: qty 1

## 2012-10-04 MED ORDER — NEOSTIGMINE METHYLSULFATE 1 MG/ML IJ SOLN
INTRAMUSCULAR | Status: DC | PRN
  Administered 2012-10-04: 4 mg via INTRAVENOUS

## 2012-10-04 MED ORDER — SUCCINYLCHOLINE CHLORIDE 20 MG/ML IJ SOLN
INTRAMUSCULAR | Status: DC | PRN
  Administered 2012-10-04: 100 mg via INTRAVENOUS

## 2012-10-04 MED ORDER — ACETAMINOPHEN 10 MG/ML IV SOLN
INTRAVENOUS | Status: AC
  Filled 2012-10-04: qty 100

## 2012-10-04 MED ORDER — ROCURONIUM BROMIDE 100 MG/10ML IV SOLN
INTRAVENOUS | Status: DC | PRN
  Administered 2012-10-04: 50 mg via INTRAVENOUS

## 2012-10-04 MED ORDER — DEXTROSE 5 % IV SOLN
3.0000 g | INTRAVENOUS | Status: AC
  Administered 2012-10-04: 3 g via INTRAVENOUS
  Filled 2012-10-04: qty 3000

## 2012-10-04 MED ORDER — FENTANYL CITRATE 0.05 MG/ML IJ SOLN
INTRAMUSCULAR | Status: AC
  Filled 2012-10-04: qty 2

## 2012-10-04 MED ORDER — GLYCOPYRROLATE 0.2 MG/ML IJ SOLN
INTRAMUSCULAR | Status: DC | PRN
  Administered 2012-10-04: .7 mg via INTRAVENOUS

## 2012-10-04 MED ORDER — LIDOCAINE HCL (CARDIAC) 20 MG/ML IV SOLN
INTRAVENOUS | Status: DC | PRN
  Administered 2012-10-04: 50 mg via INTRAVENOUS

## 2012-10-04 MED ORDER — ONDANSETRON HCL 4 MG/2ML IJ SOLN
INTRAMUSCULAR | Status: DC | PRN
  Administered 2012-10-04: 4 mg via INTRAVENOUS

## 2012-10-04 MED ORDER — OXYCODONE-ACETAMINOPHEN 5-325 MG/5ML PO SOLN: 5.0000 mL | ORAL | Status: DC | PRN

## 2012-10-04 MED ORDER — MORPHINE SULFATE 10 MG/ML IJ SOLN: 2.0000 mg | INTRAMUSCULAR | Status: DC | PRN

## 2012-10-04 MED ORDER — CEFAZOLIN SODIUM-DEXTROSE 2-3 GM-% IV SOLR
INTRAVENOUS | Status: AC
  Filled 2012-10-04: qty 50

## 2012-10-04 MED ORDER — FENTANYL CITRATE 0.05 MG/ML IJ SOLN
INTRAMUSCULAR | Status: DC | PRN
  Administered 2012-10-04 (×3): 50 ug via INTRAVENOUS
  Administered 2012-10-04: 100 ug via INTRAVENOUS

## 2012-10-04 MED ORDER — DEXAMETHASONE SODIUM PHOSPHATE 10 MG/ML IJ SOLN
INTRAMUSCULAR | Status: DC | PRN
  Administered 2012-10-04: 10 mg via INTRAVENOUS

## 2012-10-04 MED ORDER — LACTATED RINGERS IR SOLN
Status: DC | PRN
  Administered 2012-10-04: 1000 mL

## 2012-10-04 MED ORDER — PROPOFOL 10 MG/ML IV BOLUS
INTRAVENOUS | Status: DC | PRN
  Administered 2012-10-04: 200 mg via INTRAVENOUS

## 2012-10-04 MED ORDER — BUPIVACAINE-EPINEPHRINE 0.25% -1:200000 IJ SOLN
INTRAMUSCULAR | Status: DC | PRN
  Administered 2012-10-04: 50 mL

## 2012-10-04 MED ORDER — PROMETHAZINE HCL 25 MG/ML IJ SOLN: 6.2500 mg | INTRAMUSCULAR | Status: DC | PRN

## 2012-10-04 MED ORDER — FENTANYL CITRATE 0.05 MG/ML IJ SOLN
25.0000 ug | INTRAMUSCULAR | Status: DC | PRN
  Administered 2012-10-04 (×2): 25 ug via INTRAVENOUS

## 2012-10-04 MED ORDER — ONDANSETRON HCL 4 MG/2ML IJ SOLN: 4.0000 mg | INTRAMUSCULAR | Status: DC | PRN

## 2012-10-04 MED ORDER — MEPERIDINE HCL 50 MG/ML IJ SOLN: 6.2500 mg | INTRAMUSCULAR | Status: DC | PRN

## 2012-10-04 MED ORDER — CEFAZOLIN SODIUM 1-5 GM-% IV SOLN
INTRAVENOUS | Status: AC
  Filled 2012-10-04: qty 50

## 2012-10-04 MED ORDER — LACTATED RINGERS IV SOLN: INTRAVENOUS | Status: DC

## 2012-10-04 MED ORDER — OXYCODONE-ACETAMINOPHEN 5-325 MG/5ML PO SOLN
5.0000 mL | ORAL | Status: DC | PRN
  Administered 2012-10-04: 5 mL via ORAL
  Filled 2012-10-04: qty 5

## 2012-10-04 MED ORDER — LACTATED RINGERS IV SOLN
INTRAVENOUS | Status: DC | PRN
  Administered 2012-10-04: 07:00:00 via INTRAVENOUS

## 2012-10-04 SURGICAL SUPPLY — 90 items
APPLIER CLIP ROT 10 11.4 M/L (STAPLE)
BAND LAP VG SYSTEM W/TUBES (Band) ×3 IMPLANT
BENZOIN TINCTURE PRP APPL 2/3 (GAUZE/BANDAGES/DRESSINGS) ×4 IMPLANT
BLADE HEX COATED 2.75 (ELECTRODE) ×4 IMPLANT
BLADE SURG 15 STRL LF DISP TIS (BLADE) ×3 IMPLANT
BLADE SURG 15 STRL SS (BLADE) ×1
BLADE SURG SZ11 CARB STEEL (BLADE) ×4 IMPLANT
CABLE HIGH FREQUENCY MONO STRZ (ELECTRODE) IMPLANT
CANISTER SUCTION 2500CC (MISCELLANEOUS) ×4 IMPLANT
CHLORAPREP W/TINT 26ML (MISCELLANEOUS) ×4 IMPLANT
CLAMP ENDO BABCK 10MM (STAPLE) IMPLANT
CLIP APPLIE ROT 10 11.4 M/L (STAPLE) IMPLANT
CLOTH BEACON ORANGE TIMEOUT ST (SAFETY) ×4 IMPLANT
COVER SURGICAL LIGHT HANDLE (MISCELLANEOUS) IMPLANT
DECANTER SPIKE VIAL GLASS SM (MISCELLANEOUS) IMPLANT
DERMABOND ADVANCED (GAUZE/BANDAGES/DRESSINGS) ×1
DERMABOND ADVANCED .7 DNX12 (GAUZE/BANDAGES/DRESSINGS) ×3 IMPLANT
DEVICE SUT QUICK LOAD TK 5 (STAPLE) ×12 IMPLANT
DEVICE SUT TI-KNOT TK 5X26 (MISCELLANEOUS) ×4 IMPLANT
DEVICE SUTURE ENDOST 10MM (ENDOMECHANICALS) ×4 IMPLANT
DISSECTOR BLUNT TIP ENDO 5MM (MISCELLANEOUS) ×4 IMPLANT
DRAIN PENROSE 18X1/2 LTX STRL (DRAIN) IMPLANT
DRAPE CAMERA CLOSED 9X96 (DRAPES) ×4 IMPLANT
DRAPE LAPAROSCOPIC ABDOMINAL (DRAPES) IMPLANT
DRAPE UTILITY XL STRL (DRAPES) ×12 IMPLANT
ELECT REM PT RETURN 9FT ADLT (ELECTROSURGICAL) ×4
ELECTRODE REM PT RTRN 9FT ADLT (ELECTROSURGICAL) ×3 IMPLANT
FELT TEFLON 4 X1 (Mesh General) ×4 IMPLANT
FILTER SMOKE EVAC LAPAROSHD (FILTER) IMPLANT
GLOVE BIO SURGEON STRL SZ7.5 (GLOVE) ×4 IMPLANT
GLOVE BIOGEL M STRL SZ7.5 (GLOVE) IMPLANT
GLOVE BIOGEL PI IND STRL 7.0 (GLOVE) IMPLANT
GLOVE BIOGEL PI INDICATOR 7.0 (GLOVE)
GLOVE INDICATOR 8.0 STRL GRN (GLOVE) ×4 IMPLANT
GOWN STRL NON-REIN LRG LVL3 (GOWN DISPOSABLE) ×4 IMPLANT
GOWN STRL REIN XL XLG (GOWN DISPOSABLE) ×12 IMPLANT
GRASPER ENDO BABCOCK 10 (MISCELLANEOUS) IMPLANT
GRASPER ENDO BABCOCK 10MM (MISCELLANEOUS)
HAND ACTIVATED (MISCELLANEOUS) IMPLANT
HOVERMATT SINGLE USE (MISCELLANEOUS) ×4 IMPLANT
KIT BASIN OR (CUSTOM PROCEDURE TRAY) ×4 IMPLANT
LAP BAND VG SYSTEM W/TUBES (Band) ×4 IMPLANT
MESH HERNIA 1X4 RECT BARD (Mesh General) ×3 IMPLANT
MESH HERNIA BARD 1X4 (Mesh General) ×1 IMPLANT
NEEDLE SPNL 22GX3.5 QUINCKE BK (NEEDLE) ×4 IMPLANT
NS IRRIG 1000ML POUR BTL (IV SOLUTION) ×4 IMPLANT
PACK UNIVERSAL I (CUSTOM PROCEDURE TRAY) ×4 IMPLANT
PENCIL BUTTON HOLSTER BLD 10FT (ELECTRODE) ×4 IMPLANT
SCALPEL HARMONIC ACE (MISCELLANEOUS) IMPLANT
SCISSORS LAP 5X35 DISP (ENDOMECHANICALS) IMPLANT
SET IRRIG TUBING LAPAROSCOPIC (IRRIGATION / IRRIGATOR) IMPLANT
SLEEVE ADV FIXATION 5X100MM (TROCAR) IMPLANT
SLEEVE Z-THREAD 5X100MM (TROCAR) IMPLANT
SOLUTION ANTI FOG 6CC (MISCELLANEOUS) ×4 IMPLANT
SPONGE LAP 18X18 X RAY DECT (DISPOSABLE) ×4 IMPLANT
STAPLER VISISTAT 35W (STAPLE) ×4 IMPLANT
STRIP CLOSURE SKIN 1/2X4 (GAUZE/BANDAGES/DRESSINGS) IMPLANT
SUT ETHIBOND 2 0 SH (SUTURE) ×3
SUT ETHIBOND 2 0 SH 36X2 (SUTURE) ×9 IMPLANT
SUT MNCRL AB 4-0 PS2 18 (SUTURE) ×4 IMPLANT
SUT PROLENE 2 0 CT2 30 (SUTURE) ×4 IMPLANT
SUT SILK 0 (SUTURE) ×1
SUT SILK 0 30XBRD TIE 6 (SUTURE) ×3 IMPLANT
SUT SURGIDAC NAB ES-9 0 48 120 (SUTURE) ×16 IMPLANT
SUT VIC AB 2-0 SH 27 (SUTURE) ×1
SUT VIC AB 2-0 SH 27X BRD (SUTURE) ×3 IMPLANT
SUT VIC AB 4-0 SH 18 (SUTURE) IMPLANT
SYR 20CC LL (SYRINGE) ×4 IMPLANT
SYR 30ML LL (SYRINGE) ×4 IMPLANT
SYR CONTROL 10ML LL (SYRINGE) ×4 IMPLANT
SYS KII OPTICAL ACCESS 15MM (TROCAR) ×4
SYSTEM KII OPTICAL ACCESS 15MM (TROCAR) ×3 IMPLANT
TIP INNERVISION DETACH 40FR (MISCELLANEOUS) IMPLANT
TIP INNERVISION DETACH 50FR (MISCELLANEOUS) IMPLANT
TIP INNERVISION DETACH 56FR (MISCELLANEOUS) IMPLANT
TIPS INNERVISION DETACH 40FR (MISCELLANEOUS)
TOWEL OR 17X26 10 PK STRL BLUE (TOWEL DISPOSABLE) ×4 IMPLANT
TRAY FOLEY CATH 14FRSI W/METER (CATHETERS) IMPLANT
TRAY LAP CHOLE (CUSTOM PROCEDURE TRAY) IMPLANT
TROCAR ADV FIXATION 11X100MM (TROCAR) IMPLANT
TROCAR ADV FIXATION 5X100MM (TROCAR) IMPLANT
TROCAR BLADELESS OPT 5 100 (ENDOMECHANICALS) ×4 IMPLANT
TROCAR XCEL BLUNT TIP 100MML (ENDOMECHANICALS) IMPLANT
TROCAR XCEL NON-BLD 11X100MML (ENDOMECHANICALS) IMPLANT
TROCAR Z-THREAD FIOS 11X100 BL (TROCAR) IMPLANT
TROCAR Z-THREAD FIOS 5X100MM (TROCAR) ×8 IMPLANT
TROCAR Z-THREAD SLEEVE 11X100 (TROCAR) IMPLANT
TUBE CALIBRATION LAPBAND (TUBING) ×4 IMPLANT
TUBING FILTER THERMOFLATOR (ELECTROSURGICAL) IMPLANT
TUBING INSUFFLATION 10FT LAP (TUBING) ×4 IMPLANT

## 2012-10-04 NOTE — Transfer of Care (Signed)
Immediate Anesthesia Transfer of Care Note  Patient: Lori Montgomery  Procedure(s) Performed: Procedure(s) (LRB): LAPAROSCOPIC GASTRIC BANDING (N/A) MESH APPLIED TO LAP PORT (N/A)  Patient Location: PACU  Anesthesia Type: General  Level of Consciousness: sedated, patient cooperative and responds to stimulaton  Airway & Oxygen Therapy: Patient Spontanous Breathing and Patient connected to face mask oxgen  Post-op Assessment: Report given to PACU RN and Post -op Vital signs reviewed and stable  Post vital signs: Reviewed and stable  Complications: No apparent anesthesia complications

## 2012-10-04 NOTE — Op Note (Signed)
Laparoscopic Adjustable Gastric Band Placement Operative Note   Pre-operative Diagnosis: Morbid Obesity (BMI 41.7) Hypercholesterolemia  Hyperlipidemia  Depression  Joint pain (Right knee and Left ankle)   Post-operative Diagnosis: same  Surgeon: Atilano Ina   Assistants: Ovidio Kin, MD  Anesthesia: General endotracheal anesthesia and Local anesthesia 0.25.% bupivacaine, with epinephrine  ASA Class: 3   Anesthesia: General plus local  Indications: Morbid Obesity unresponsive to medical treatment.  Findings: AP-Standard band; no significant hiatal hernia on calibration tube testing   Procedure Details  The patient was seen in the Holding Room. The risks, benefits, complications, treatment options, and expected outcomes were discussed with the patient. The possibilities of reaction to medication, pulmonary aspiration, perforation of viscus, bleeding, recurrent infection, the need for additional procedures, failure to diagnose a condition, and creating a complication requiring transfusion or operation were discussed with the patient. The patient concurred with the proposed plan, giving informed consent.   The patient was taken to Operating Room # 1 at [facility], identified as Myrla Halsted and the procedure verified as Laparoscopic Adjustable Gastric Band Placement and possible hiatal hernia repair. A Time Out was held and the above information confirmed.  Full general anesthesia was induced with orotracheal intubation.  The patient was prepped and draped in a supine position. Appropriate antibiotics were given intravenously.  A 1cm incision was made 2 fingerbreadths below the left subcostal margin . Opitview technique was used to gain entry to the abdominal cavity. A 5 mm blunt trocar was advanced under direct vision through the abdominal wall with a 0 degree scope. The abdomen was insufflated, the laparoscope introduced.  There were no untoward findings on  diagnostic laparoscopy except for a few adhesions in RUQ from her prior cholecystectomy. Trocars were placed under direct vision in the following fashion: a 5mm trocar in the lateral right quadrant, a 15 mm trocar in the right upper quadrant, a 5mm trocar in the high epigastrium, and one 5mm trocar slightly above and to the left of the umbilicus. The patient was placed in reverse trendelenburg position.  [facility] liver retractor was then placed through the high epigastric trocar site and positioned to hold the liver.   A calibration tube had placed into the stomach thru the oropharynx by the CRNA. It was inflated with 10cc of air and gently pulled back toward the GE junction. It didn't slide up above the diaphragm. The balloon was deflated and the tube was slid back into the esophagus.   The angle of His was identified and the left crus was dissected free.  Approximately 8cm below the angle of His on the lesser curvature, passing through the pars flaccida and preserving the vagus nerve, a blunt instrument was gently passed anterior to the right crus and behind the gastro-esophageal junction without difficulty. Care was taken to minimize posterior dissection in order to prevent a posterior slip.   A AP-Standard Lap Band had been introduced into the abdominal cavity through the 15mm trocar and carried around the gastro-esophageal junction and locked onto itself over the calibration tube which was subsequently removed and discarded. Three interrupted 2.0 Ethibond sutures (each secured with a titanium tie knot) were used to imbricate the anterior stomach to itself over the band to prevent anterior slippage.   The bowel was examined and there were no obvious lesions. Hemostasis was verified. The liver retractor was removed under direct visualization. There was no evidence of liver injury. The tubing from the band was brought out via  the right upper quadrant 11mm trocar site. All trocars were then removed  under direct vision. The skin incision was lengthened and a subcutaneous space was made to accommodate the port. A 1 inch square of vicryl mesh was anchored to the base of the port with 4 sutures. The port was attached to the tubing and then placed in the subcutaneous pocket.  The redundant tubing was advanced back into the abdominal cavity. Inverted interrupted deep dermal sutures using a 2-0 vicryl were placed.   The wounds were heavily irrigated. The skin incisions were closed with 4-0 monocryl. Dermabond was applied.   Instrument, sponge, and needle counts were correct prior to wound closure and at the conclusion of the case.          Complications:  None; patient tolerated the procedure well.                Condition: stable  [name]. Andrey Campanile, MD, FACS General, Bariatric, & Minimally Invasive Surgery [facility] Surgery, [place]

## 2012-10-04 NOTE — Interval H&P Note (Signed)
History and Physical Interval Note:  10/04/2012 7:15 AM  Lori Montgomery  has presented today for surgery, with the diagnosis of morbid obesity  The various methods of treatment have been discussed with the patient and family. After consideration of risks, benefits and other options for treatment, the patient has consented to  Procedure(s) with comments: LAPAROSCOPIC GASTRIC BANDING (N/A) - Laparoscopic Adjustable Gastric Banding, possible Hiatal Hernia Repair LAPAROSCOPIC REPAIR OF HIATAL HERNIA (N/A) as a surgical intervention .  The patient's history has been reviewed, patient examined, no change in status, stable for surgery.  I have reviewed the patient's chart and labs.  Questions were answered to the patient's satisfaction.    [name]. Andrey Campanile, MD, FACS General, Bariatric, & Minimally Invasive Surgery [facility] Surgery, [place]   [facility] M

## 2012-10-04 NOTE — Progress Notes (Signed)
Spoke to Dr. Andrey Campanile regarding pt going home.  Informed Dr. Andrey Campanile of pt ambulating, VSS, pt voided, no pain and bariatric nurse has came by and spoke with pt regarding discharge instructions.  Pt tolerating ice chips with the allowed amount.  Dr. Andrey Campanile ok with pt. Going home.

## 2012-10-04 NOTE — Anesthesia Postprocedure Evaluation (Signed)
  Anesthesia Post-op Note  Patient: Lori Montgomery  Procedure(s) Performed: Procedure(s) (LRB): LAPAROSCOPIC GASTRIC BANDING (N/A) MESH APPLIED TO LAP PORT (N/A)  Patient Location: PACU  Anesthesia Type: General  Level of Consciousness: awake and alert   Airway and Oxygen Therapy: Patient Spontanous Breathing  Post-op Pain: mild  Post-op Assessment: Post-op Vital signs reviewed, Patient's Cardiovascular Status Stable, Respiratory Function Stable, Patent Airway and No signs of Nausea or vomiting  Last Vitals:  Filed Vitals:   10/04/12 1025  BP: 133/89  Pulse: 95  Temp: 36.3 C  Resp: 16    Post-op Vital Signs: stable   Complications: No apparent anesthesia complications

## 2012-10-04 NOTE — Anesthesia Preprocedure Evaluation (Addendum)
Anesthesia Evaluation  Patient identified by MRN, date of birth, ID band Patient awake    Reviewed: Allergy & Precautions, H&P , NPO status , Patient's Chart, lab work & pertinent test results  History of Anesthesia Complications (+) PONV  Airway Mallampati: II TM Distance: >3 FB Neck ROM: Full    Dental no notable dental hx.    Pulmonary neg pulmonary ROS,  breath sounds clear to auscultation  Pulmonary exam normal       Cardiovascular negative cardio ROS  Rhythm:Regular Rate:Normal     Neuro/Psych negative neurological ROS  negative psych ROS   GI/Hepatic negative GI ROS, Neg liver ROS, hiatal hernia,   Endo/Other  negative endocrine ROSMorbid obesity  Renal/GU negative Renal ROS  negative genitourinary   Musculoskeletal negative musculoskeletal ROS (+)   Abdominal   Peds negative pediatric ROS (+)  Hematology negative hematology ROS (+)   Anesthesia Other Findings   Reproductive/Obstetrics negative OB ROS                          Anesthesia Physical Anesthesia Plan  ASA: III  Anesthesia Plan: General   Post-op Pain Management:    Induction: Intravenous  Airway Management Planned: Oral ETT  Additional Equipment:   Intra-op Plan:   Post-operative Plan: Extubation in OR  Informed Consent: I have reviewed the patients History and Physical, chart, labs and discussed the procedure including the risks, benefits and alternatives for the proposed anesthesia with the patient or authorized representative who has indicated his/her understanding and acceptance.   Dental advisory given  Plan Discussed with: CRNA  Anesthesia Plan Comments:         Anesthesia Quick Evaluation

## 2012-10-04 NOTE — H&P (View-Only) (Signed)
Patient ID: Lori Montgomery, female   DOB: 10/27/1970, 42 y.o.   MRN: [id number]  Chief Complaint  Patient presents with  . Bariatric Pre-op    HPI Lori Montgomery is a 41 y.o. female.   HPI 41-year-old Caucasian female who is morbidly obese comes in for her preop appt for  laparoscopic adjustable gastric band surgery. I have seen on several occasions to discuss weight loss surgery. She has been approved by her insurance provider. She states that she has had worsening right knee as well as worsening left ankle pain since she was seen in the summer. She states that her primary care physician instructed her to stop exercising and going to the gym because of the worsening musculoskeletal pain. She also states that she had a recent physical in mid December and was told that her cholesterol level is worsening. Last summer it was 205 and now it is 218. She denies any trips to the emergency room or hospitalization since last summer. She denies any abdominal pain, nausea, vomiting, reflux. She denies any diarrhea or constipation. She denies any chest pain or shortness of breath. She has still been following a low calorie diet but still has not lost any weight since she was seen last summer.  No new changes since seen in Jan 2014.   PMHx, PSHx, SOCHx, FAMHx, ALL reviewed and unchanged   Past Medical History  Diagnosis Date  . Depression   . Degenerative joint disease   . Gout   . Morbid obesity with BMI of 40.0-44.9, adult   . H/O hiatal hernia   . PONV (postoperative nausea and vomiting)     states also had localized reaction to IV meds during/following endoscopy 2011 requiring IV Benadryl- ? location of test    Past Surgical History  Procedure Laterality Date  . Breast reduction surgery  2008    with scar revision  . Laparoscopic cholecystectomy  03/2010    Dr Gross  . Knee arthroscopy      right knee/ with ACL REPAIR    Family History  Problem Relation Age of Onset  . Diabetes Mother    . Lung cancer Maternal Grandmother   . Breast cancer Maternal Aunt   . Diabetes Father   . Heart disease Paternal Grandmother   . Heart disease Paternal Grandfather     Social History History  Substance Use Topics  . Smoking status: Former Smoker    Quit date: 01/23/2011  . Smokeless tobacco: Never Used  . Alcohol Use: Yes     Comment: rarely    No Known Allergies  Current Outpatient Prescriptions  Medication Sig Dispense Refill  . ARIPiprazole (ABILIFY) 5 MG tablet Take 5 mg by mouth every morning.       . bisacodyl (CORRECTIVE LAXATIVE) 5 MG EC tablet Take 5 mg by mouth daily.       . venlafaxine (EFFEXOR-XR) 150 MG 24 hr capsule Take 150 mg by mouth every morning. Take with 75 mg to make 225 mg      . venlafaxine (EFFEXOR-XR) 75 MG 24 hr capsule Take 75 mg by mouth every morning. Take with 150 mg to make 225 mg       No current facility-administered medications for this visit.    Review of Systems Review of Systems  Constitutional: Negative for fever, chills and unexpected weight change.  HENT: Negative for hearing loss, congestion, sore throat, trouble swallowing and voice change.   Eyes: Negative for visual disturbance.  Respiratory:   Negative for cough and wheezing.   Cardiovascular: Negative for chest pain, palpitations and leg swelling.  Gastrointestinal: Negative for nausea, vomiting, abdominal pain, diarrhea, blood in stool, abdominal distention and anal bleeding.       Denies reflux  Genitourinary: Negative for hematuria, vaginal bleeding and difficulty urinating.  Musculoskeletal: Negative for arthralgias.       Joint pain  Skin: Negative for rash and wound.  Neurological: Negative for seizures, syncope and headaches.  Hematological: Negative for adenopathy. Does not bruise/bleed easily.  Psychiatric/Behavioral: Negative for confusion.    Blood pressure 126/84, pulse 72, temperature 98 F (36.7 C), temperature source Temporal, resp. rate 14, height 5' 7"  (1.702 m), weight 267 lb (121.11 kg), last menstrual period 09/21/2012.  Physical Exam Physical Exam  Vitals reviewed. Constitutional: She is oriented to person, place, and time. She appears well-developed and well-nourished. No distress.  HENT:  Head: Normocephalic and atraumatic.  Right Ear: External ear normal.  Left Ear: External ear normal.  Eyes: Conjunctivae are normal. No scleral icterus.  Neck: Normal range of motion. Neck supple. No tracheal deviation present. No thyromegaly present.  Cardiovascular: Normal rate, regular rhythm and normal heart sounds.   Pulmonary/Chest: Effort normal and breath sounds normal. No respiratory distress. She has no wheezes.  Abdominal: Soft. She exhibits no distension. There is no tenderness. There is no rebound.  Well healed trocar site  Musculoskeletal: She exhibits no edema.  Neurological: She is alert and oriented to person, place, and time.  Skin: Skin is warm and dry. No rash noted. She is not diaphoretic. No erythema.  Psychiatric: She has a normal mood and affect. Her behavior is normal. Judgment and thought content normal.    Data Reviewed My note  UGI - small sliding hiatal hernia Preop labs  Assessment    Morbid obesity BMI 41.8  Hypercholesterolemia  Hyperlipidemia  Depression  Joint pain (Right knee and Left ankle) Small sliding hiatal hernia     Plan    The patient is scheduled for laparoscopic adjustable gastric band placement with possible hiatal hernia repair next Tuesday. I discussed the potential for hiatal hernia repair with the patient. All of her questions were asked and answered. We reviewed her preoperative imaging as well as her preoperative labs. She was encouraged to continue with her preoperative diet. I will see her next Tuesday   Stormi Vandevelde M. Bruchy Mikel, MD, FACS General, Bariatric, & Minimally Invasive Surgery [facility] [place]        Yoselyn Mcglade M 09/27/2012, 4:48 PM    

## 2012-10-04 NOTE — Progress Notes (Signed)
Patient is alert and oriented, sitting up in bed.  Patient with minimal abdominal discomfort.  VSS. Patient denies nausea or vomiting.  Patient has follow up appointments with [facility] and [facility].  Patient is aware of support group and BELT program.  Discharge instructions listed below reviewed with patient.  Patient verbalized understanding.  Patient is tolerating ice chips, discharge pending.  Quenton Fetter, RN  ADJUSTABLE GASTRIC BAND DISCHARGE INSTRUCTIONS  Drs. Fredrik Rigger, [place], [place], and [place] Call if you have any problems.   Call [phone] and ask for the surgeon on call.    If you need immediate assistance come to the ER at [facility]. Tell the ER personnel that you are a new post-op gastric banding patient. Signs and symptoms to report:   Severe vomiting or nausea. If you cannot tolerate clear liquids for longer than 1 day, you need to call your surgeon.    Abdominal pain which does not get better after taking your pain medication   Fever greater than 101 F degree   Difficulty breathing   Chest pain    Redness, swelling, drainage, or foul odor at incision sites    If your incisions open or pull apart   Swelling or pain in calf (lower leg)   Diarrhea, frequent watery, uncontrolled bowel movements.   Constipation, (no bowel movements for 3 days) if this occurs, Take Milk of Magnesia, 2 tablespoons by mouth, 3 times a day for 2 days if needed.  Call your doctor if constipation continues. Stop taking Milk of Magnesia once you have had a bowel movement. You may also use Miralax according to the label instructions.   Anything you consider "abnormal for you".   Normal side effects after Surgery:   Unable to sleep at night or concentrate   Irritability   Being tearful (crying) or depressed   These are common complaints, possibly related to your anesthesia, stress of surgery and change in lifestyle, that usually go away a few weeks after surgery.  If these feelings continue, call  your medical doctor.  Wound Care You may have surgical glue, steri-strips, or staples over your incisions after surgery.  Surgical glue:  Looks like a clear film over your incisions and will wear off gradually. Steri-strips: Strips of tape over your incisions. You may notice a yellowish color on the skin underneath the steri-strips. This is a substance used to make the steri-strips stick better. Do not pull the steri-strips off - let them fall off. Staples: Cherlynn Polo may be removed before you leave the hospital. If you go home with staples, call [facility] [phone]) for an appointment with your surgeon's nurse to have staples removed in 7 - 10 days. Showering: You may shower two days after your surgery unless otherwise instructed by your surgeon. Wash gently around wounds with warm soapy water, rinse well, and gently pat dry.  If you have a drain, you may need someone to hold this while you shower. Avoid tub baths until staples are removed and incisions are healed.    Medications   Medications should be liquid or crushed if larger than the size of a dime.  Extended release pills should not be crushed.   Depending on the size and number of medications you take, you may need to stagger/change the time you take your medications so that you do not over-fill your pouch.    Make sure you follow-up with your primary care physician to make medication adjustments needed during rapid weight loss and  life-style adjustment.   If you are diabetic, follow up with the doctor that prescribes your diabetes medication(s) within one week after surgery and check your blood sugar regularly.   Do not drive while taking narcotics!   Do not take acetaminophen (Tylenol) and Roxicet or Lortab Elixir at the same time since these pain medications contain acetaminophen.  Diet at home: (First 2 Weeks)  You will see the nutritionist two weeks after your surgery. She will advance your diet if you are tolerating  liquids well. Once at home, if you have severe vomiting or nausea and cannot tolerate clear liquids lasting longer than 1 day, call your surgeon.  For Same Day Surgery Discharge Patients: The day of surgery drink water only: 2 ounces every 4 hours. If you are tolerating water, begin drinking your high protein shake the next morning. For Overnight Stay Patients: Begin high protein shake 2 ounces every 3 hours, 5 - 6 times per day.  Gradually increase the amount you drink as tolerated.  You may find it easier to slowly sip shakes throughout the day.  It is important to get your proteins in first.   Protein Shake   Drink at least 2 ounces of shake 5-6 times per day   Each serving of protein shakes should have a minimum of 15 grams of protein and no more than 5 grams of carbohydrate    Increase the amount of protein shake you drink as tolerated   Protein powder may be added to fluids such as non-fat milk or Lactaid milk (limit to 20 grams added protein powder per serving   The initial goal is to drink at least 8 ounces of protein shake/drink per day (or as directed by the nutritionist). Some examples of protein shakes are [employer], [employer], EAS Edge HP, and Unjury. Hydration   Gradually increase the amount of water and other liquids as tolerated (See Acceptable Fluids)   Gradually increase the amount of protein shake as tolerated     Sip fluids slowly and throughout the day   May use Sugar substitutes, use sparingly (limit to 6 - 8 packets per day).  Your fluid goal is 64 ounces of fluid daily. It may take a few weeks to build up to this.         32 oz (or more) should be clear liquids and 32 oz (or more) should be full liquids.         Liquids should not contain sugar, caffeine, or carbonation!  Acceptable Fluids Clear Liquids:   Water or Sugar-free flavored water, Fruit H2O   Decaffeinated coffee or tea (sugar-free)   Crystal Lite, Wyler's Lite, Minute Maid Lite   Sugar-free  Jell-O   Bouillon or broth   Sugar-free Popsicle:   *Less than 20 calories each; Limit 1 per day   Full Liquids:              Protein Shakes/Drinks + 2 choices per day of other full liquids shown below.    Other full liquids must be: No more than 12 grams of Carbs per serving,  No more than 3 grams of Fat per serving   Strained low-fat cream soup   Non-Fat milk   Fat-free Lactaid Milk   Sugar-free yogurt (Dannon Lite & Fit) Vitamins and Minerals (Start 1 day after surgery unless otherwise directed)   1 Chewable Multivitamin / Multimineral Supplement (i.e. Centrum for Adults)   Chewable Calcium Citrate with Vitamin D-3. Take 1500 mg each  day.           (Example: 3 Chewable Calcium Plus 600 with Vitamin D-3 can be found at [facility])           Do not mix multivitamins containing iron with calcium supplements; take 2 hours   apart   Do not substitute Tums (calcium carbonate) for your calcium   Menstruating women and those at risk for anemia may need extra iron. Talk with your doctor to see if you need additional iron.     If you need extra iron:  Total daily Iron recommendations (including Vitamins) = 50 - 100 mg Iron/day Do not stop taking or change any vitamins or minerals until you talk to your nutritionist or surgeon. Your nutritionist and / or physician must approve all vitamin and mineral supplements. Exercise For maximum success, begin exercising as soon as your doctor recommends. Make sure your physician approves any physical activity.   Depending on fitness level, begin with a simple walking program   Walk 5-15 minutes each day, 7 days per week.    Slowly increase until you are walking 30-45 minutes per day   Consider joining our BELT program. [phone] or email [email or web address] [email or web address] Things to remember:   You may have sexual relations when you feel comfortable. It is VERY important for female patients to use a reliable birth control method. Fertility often increases after surgery. Do not  get pregnant for at least 18 months.   It is very important to keep all follow up appointments with your surgeon, nutritionist, primary care physician, and behavioral health practitioner. After the first year, please follow up with your bariatric surgeon at least once a year in order to maintain best weight loss results.  [facility]: [phone] [facility]: [phone]   Free counseling is available for you and your family through collaboration between [facility] and [place]. Please call [phone] and leave a message.    Consider purchasing a medical alert bracelet that says you had lap-band surgery.    The [facility] has a free Bariatric Surgery Support Group that meets monthly, the 3rd Thursday, 6 pm, Classroom #1, [place]. You may register online at [email or web address], but registration is not necessary. Select Classes and Support Groups, Bariatric Surgery, or Call [phone]   Do not return to work or drive until cleared by your surgeon   Use your CPAP when sleeping if applicable   Do not lift anything greater than ten pounds for at least two weeks.   You will probably have your first fill (fluid added to your band) 6 weeks after surgery

## 2012-10-04 NOTE — Progress Notes (Signed)
Pt up in room ambulating to bathroom.  Pt voided moderated amount clear yellow urine.  Pt tolerated well.  She has no complaints at present.  Pt ambulated around short stay without difficulty.

## 2012-10-06 ENCOUNTER — Encounter (HOSPITAL_COMMUNITY): Payer: Self-pay | Admitting: General Surgery

## 2012-10-17 ENCOUNTER — Telehealth (INDEPENDENT_AMBULATORY_CARE_PROVIDER_SITE_OTHER): Payer: Self-pay | Admitting: General Surgery

## 2012-10-17 NOTE — Telephone Encounter (Signed)
Pt called to ask for appt with Dr. Andrey Campanile, before her first post op appt on 10/21/12 (Friday.)  She had lap band on 10/04/12 and states "it's not working."  She is very disappointed with not losing any weight after waiting 4 years to get the surgery.  Explained she will need to see Dr. Andrey Campanile first, but she wants to speak to either him or his assistant.  Please call her:  [phone].

## 2012-10-17 NOTE — Telephone Encounter (Signed)
Spoke with pt. States she is doing well. No f/c/n/v/abd pain. Was advanced to solids last week. Concerned that she can eat same amount of food as before surgery. No restriction. Will have a little discomfort if don't chew enough. Concerned about lack of weight loss. Explained to pt that this is typical for a new lapband pt. Explained that very few have restriction until we start adjusting band. Discussed proper eating techniques (tiny bite, chew thoroughly, waiting 20-30 sec before next bite). Will see if we can move her postop appt to wed to start with her first adjustment

## 2012-10-18 ENCOUNTER — Encounter: Payer: BC Managed Care – PPO | Attending: General Surgery | Admitting: *Deleted

## 2012-10-18 VITALS — Ht 67.0 in | Wt 259.0 lb

## 2012-10-18 DIAGNOSIS — Z713 Dietary counseling and surveillance: Secondary | ICD-10-CM | POA: Insufficient documentation

## 2012-10-18 DIAGNOSIS — Z01818 Encounter for other preprocedural examination: Secondary | ICD-10-CM | POA: Insufficient documentation

## 2012-10-18 NOTE — Telephone Encounter (Signed)
Patient called back and was updated that there are no appts for tomorrow so she will keep her Friday appt.

## 2012-10-18 NOTE — Telephone Encounter (Signed)
Left message on machine for patient to call back and ask for me. To make patient aware no appts available tomorrow. She will need to keep appt for Friday.

## 2012-10-19 NOTE — Patient Instructions (Signed)
Patient to follow Phase 3A-Soft, High Protein Diet and follow-up at [facility] in 6 weeks for 2 months post-op nutrition visit for diet advancement. 

## 2012-10-19 NOTE — Progress Notes (Signed)
Bariatric Class:  Appt start time: 1600 end time:  1700.  2 Week Post-Operative Nutrition Class  Patient was seen on 10/18/12 for Post[facility]ative [facility]   Surgery date: 10/04/12  Surgery type: LAGB  Start weight at [facility]: 253.6 lbs (03/13/11)  Pre-Op Class: 272.2 lbs (09/15/12)  Weight today: 259.0 lbs Weight change: 13.2 lbs Total weight lost: 13.2 lbs  TANITA  BODY COMP RESULTS  10/18/12   BMI (kg/m^2) 40.6   Fat Mass (lbs) 132.0   Fat Free Mass (lbs) 127.0   Total Body Water (lbs) 93.0   The following the learning objectives were met by the patient during this course:  Identifies Phase 3A (Soft, High Proteins) Dietary Goals and will begin from 2 weeks post-operatively to 2 months post-operatively  Identifies appropriate sources of fluids and proteins   States protein recommendations and appropriate sources post-operatively  Identifies the need for appropriate texture modifications, mastication, and bite sizes when consuming solids  Identifies appropriate multivitamin and calcium sources post-operatively  Describes the need for physical activity post-operatively and will follow MD recommendations  States when to call healthcare provider regarding medication questions or post-operative complications  Handouts given during class include:  Phase 3A: Soft, High Protein Diet Handout  Band Fill Guidelines Handout  Follow-Up Plan: Patient will follow-up at [facility] in 6 weeks for 2 months post-op nutrition visit for diet advancement per MD.

## 2012-10-21 ENCOUNTER — Encounter (INDEPENDENT_AMBULATORY_CARE_PROVIDER_SITE_OTHER): Payer: BC Managed Care – PPO | Admitting: General Surgery

## 2012-10-21 ENCOUNTER — Ambulatory Visit (INDEPENDENT_AMBULATORY_CARE_PROVIDER_SITE_OTHER): Payer: BC Managed Care – PPO | Admitting: General Surgery

## 2012-10-21 ENCOUNTER — Encounter (INDEPENDENT_AMBULATORY_CARE_PROVIDER_SITE_OTHER): Payer: Self-pay | Admitting: General Surgery

## 2012-10-21 VITALS — BP 136/72 | HR 86 | Temp 97.7°F | Resp 18 | Ht 67.0 in | Wt 259.0 lb

## 2012-10-21 DIAGNOSIS — Z9884 Bariatric surgery status: Secondary | ICD-10-CM

## 2012-10-21 NOTE — Progress Notes (Signed)
Subjective:     Patient ID: Lori Montgomery, female   DOB: 21-Apr-1971, 42 y.o.   MRN: [id number]  HPI 42 year old female status post laparoscopic adjustable gastric band placement on 10/04/1998 410 comes in today for her first postoperative appointment. She states that she is doing very well. She denies any regurgitation or reflux. She denies any nighttime cough. She denies any fevers or chills. She still having irregular bowel movements which is chronic for her. She denies any incisional redness or drainage. She has seen the nutritionist and been advanced to solid foods. She states that she has little to no restriction.she is walking on a daily basis in her neighborhood. She is interested in joining the [employer]  Review of Systems     Objective:   Physical Exam BP 136/72  Pulse 86  Temp(Src) 97.7 F (36.5 C)  Resp 18  Ht 5\' 7"  (1.702 m)  Wt 259 lb (117.482 kg)  BMI 40.56 kg/m2  LMP 09/21/2012  See LapBand flowsheet  Gen: alert, NAD, non-toxic appearing HEENT: normocephalic, atraumatic; pupils equal, no scleral icterus, neck supple, no lymphadenopathy Pulm: Lungs clear to auscultation, symmetric chest rise CV: regular rate and rhythm Abd: soft, nontender, nondistended. Well-healed trocar sites. No incisional hernia. Port is in right mid-abdomen Ext: no edema, normal, symmetric strength Neuro: nonfocal, sensation grossly intact Psych: appropriate, judgment normal     Assessment:     Status post laparoscopic adjustable gastric band placement Hyperlipidemia Joint pain Depression     Plan:     Overall she is doing well. Her total weight loss has been 13.2 pounds. I congratulated her on her weight loss. We stressed the importance of daily routine exercise. We talked about interval walking.  After obtaining verbal consent, the abdominal wall was prepped with Chloraprep. The port was accessed with a Huber needle and 1 cc of saline was added to give the patient an  expected fill volume of 2.5 cc.  The patient was able to tolerate sips of water.  She was instructed to stay on a liquid diet for the next 24 hours. We also rediscussed the importance of proper eating techniques.  Followup 4 weeks  [name]. Andrey Campanile, MD, FACS General, Bariatric, & Minimally Invasive Surgery [facility], [place]

## 2012-10-21 NOTE — Patient Instructions (Signed)
1. Stay on liquids for the next 1- 2 days as you adapt to your new fill volume.  Then resume your previous diet. 2. Decreasing your carbohydrate intake will hasten you weight loss.  Rely more on proteins for your meals.  Avoid condiments that contain sweets such as Honey Mustard and sugary salad dressings.   3. Stay in the "green zone".  If you are regurgitating with meals, having night time reflux, and find yourself eating soft comfort foods (mashed potatoes, potato chips)...realize that you are developing "maladaptive eating".  You will not lose weight this way and may regain weight.  The GREEN ZONE is eating smaller portions and not regurgitating.  Hence we may need to withdraw fluid from your band. 4. Build exercise into your daily routine.  Walking is the best way to start but do something every day if you can.    

## 2012-11-14 ENCOUNTER — Ambulatory Visit (INDEPENDENT_AMBULATORY_CARE_PROVIDER_SITE_OTHER): Payer: BC Managed Care – PPO | Admitting: General Surgery

## 2012-11-14 ENCOUNTER — Encounter (INDEPENDENT_AMBULATORY_CARE_PROVIDER_SITE_OTHER): Payer: Self-pay | Admitting: General Surgery

## 2012-11-14 VITALS — BP 136/82 | HR 83 | Temp 98.4°F | Resp 18 | Ht 67.0 in | Wt 254.6 lb

## 2012-11-14 DIAGNOSIS — Z9884 Bariatric surgery status: Secondary | ICD-10-CM

## 2012-11-14 NOTE — Progress Notes (Signed)
Subjective:     Patient ID: Lori Montgomery, female   DOB: 25-May-1971, 42 y.o.   MRN: [id number]  HPI 42 year old Caucasian female status post laparoscopic adjustable gastric band placement on 10/04/2012 comes in today for her second postoperative appearance. I last saw her about 4 weeks ago. She denies any fever, chills, nausea, vomiting. She denies any regurgitation. She denies any morning cough or reflux. She states she can still eat large portions although she is physically limiting herself. She reports that she is walking at least 4 times a week up and down hills. She is changing her pace while  walking. She reports that she has made good food choices. She has eliminated all carbohydrates from her diet. She is interested in doing the on line Version of the BELT program  Review of Systems     Objective:   Physical Exam BP 136/82  Pulse 83  Temp(Src) 98.4 F (36.9 C)  Resp 18  Ht 5\' 7"  (1.702 m)  Wt 254 lb 9.6 oz (115.486 kg)  BMI 39.87 kg/m2  See LapBand flowsheet  Gen: alert, NAD, non-toxic appearing HEENT: normocephalic, atraumatic; pupils equal, no scleral icterus, neck supple, no lymphadenopathy Pulm: Lungs clear to auscultation, symmetric chest rise CV: regular rate and rhythm Abd: soft, nontender, nondistended. Well-healed trocar sites. No incisional hernia. Port is in right mid-abdomen Ext: no edema, normal, symmetric strength Neuro: nonfocal, sensation grossly intact Psych: appropriate, judgment normal     Assessment:     Status post laparoscopic adjustable gastric band placement     PHer total weight loss has been approximately 8 pounds.lan:     She has lost an additional 5 pounds since her last visit about 4 weeks ago. I congratulated her on her weight loss despite her not having much restriction. Her total weight loss to date has been approximately 18 pounds. I encouraged her to continue exercising on a regular basis.  She was also given a prescription to the BELT  program.  Since she doesn't have any restriction we performed a lap band adjustment today.  After obtaining verbal consent, the abdominal wall was prepped with Chloraprep. The port was accessed with a Huber needle and 1 cc of saline was added to give the patient an expected fill volume of 3.5 cc.  The patient was able to tolerate sips of water.   Followup 4-6 weeks  [name]. Andrey Campanile, MD, FACS General, Bariatric, & Minimally Invasive Surgery [facility], [place]

## 2012-11-14 NOTE — Patient Instructions (Signed)
1. Stay on liquids for the next 1- 2 days as you adapt to your new fill volume.  Then resume your previous diet. 2. Decreasing your carbohydrate intake will hasten you weight loss.  Rely more on proteins for your meals.  Avoid condiments that contain sweets such as Honey Mustard and sugary salad dressings.   3. Stay in the "green zone".  If you are regurgitating with meals, having night time reflux, and find yourself eating soft comfort foods (mashed potatoes, potato chips)...realize that you are developing "maladaptive eating".  You will not lose weight this way and may regain weight.  The GREEN ZONE is eating smaller portions and not regurgitating.  Hence we may need to withdraw fluid from your band. 4. Build exercise into your daily routine.  Walking is the best way to start but do something every day if you can.    

## 2012-11-29 ENCOUNTER — Ambulatory Visit: Payer: BC Managed Care – PPO | Admitting: *Deleted

## 2012-12-12 ENCOUNTER — Encounter: Payer: BC Managed Care – PPO | Attending: General Surgery | Admitting: *Deleted

## 2012-12-12 ENCOUNTER — Encounter: Payer: Self-pay | Admitting: *Deleted

## 2012-12-12 VITALS — Ht 67.0 in | Wt 253.5 lb

## 2012-12-12 DIAGNOSIS — E66813 Obesity, class 3: Secondary | ICD-10-CM

## 2012-12-12 DIAGNOSIS — Z01818 Encounter for other preprocedural examination: Secondary | ICD-10-CM | POA: Insufficient documentation

## 2012-12-12 DIAGNOSIS — Z713 Dietary counseling and surveillance: Secondary | ICD-10-CM | POA: Insufficient documentation

## 2012-12-12 NOTE — Patient Instructions (Addendum)
Goals:  Follow Phase 3B: High Protein + Non-Starchy Vegetables  Eat 3-6 small meals/snacks - NO MEAL SKIPPING!!   For the short term, try a protein shake at breakfast instead of skipping  Avoid pork skins; Try PB2   Increase lean protein foods to meet 60-80g goal  Increase fluid intake to 64oz +  Avoid all caffeinated, carbonated, and sweetened beverages  Aim for >30 min of physical activity daily

## 2012-12-12 NOTE — Progress Notes (Addendum)
  Follow-up visit:  12 Weeks Post-Operative LAGB Surgery  Medical Nutrition Therapy:  Appt start time: 1200   End time: 1230.  Primary concerns today: Post-operative Bariatric Surgery Nutrition Management. Jaskiran returns today for 8 wk f/u concerned about lack of wt loss. Has had 2 fills since surgery and feels no restriction at all. Another appt set for 5/8 with surgeon. Recall reveals breakfast skipping and large portions at meals (can eat 2-3 [place] burgers without bun at a sitting). Reports she has been eating a lot of fast food d/t her childrens' sports schedules. Decreased fluids/protein noted. Pt is also drinking (2) 12 oz cans of diet soda daily.   Surgery date: 10/04/12  Surgery type: LAGB  [facility] weight at [facility]: 253.6 lbs (03/13/11)  Pre-Op Class: 272.2 lbs (09/15/12)  Weight today: 253.5 lbs Weight change: 5.5 lbs Total weight lost: 18.7 lbs  TANITA  BODY COMP RESULTS  10/18/12 12/12/12   BMI (kg/m^2) 40.6 39.7   Fat Mass (lbs) 132.0 130.0   Fat Free Mass (lbs) 127.0 123.5   Total Body Water (lbs) 93.0 90.5   24-hr recall: B (AM): NONE Snk (AM): NONE  L (PM): 3-4 cups salad w/ chicken, beans, corn, mixed greens, & balsamic vin - [employer] (25g) Snk (PM): NONE D (6-6:30 PM): 2 oz carne asada (steak) - (15-18g) Snk (7 PM): Pork rinds (3 oz)  Fluid intake: Diet Dr. Hannah Beat Mtn Dew (24 oz), some water w/ Mio = 30 oz Estimated total protein intake: 40-42g  Medications: No changes.  Supplementation: Taking  Using straws: No Drinking while eating: No Hair loss: No Carbonated beverages: Yes; 24 oz daily N/V/D/C: None Last Lap-Band fill:  Has had 2 fills so far; no restriction felt. Another fill set for 5/8  Recent physical activity:  Just started walking Saturday (5/3); plans to exercise 4 days/week @ 1.5 hours.  Has appt for BELT distance program on Wed, but states she may reschedule as it is her birthday.  Progress Towards Goal(s):  In progress.  Handouts given  during visit include:  Phase 3B: High Protein + Non-Starchy Vegetables  Bariatric Fast Food Guide  Samples given during visit include:   Freedavite MVI: 2 bottles @ 5 tabs/bottle Lot: [id number]; Exp: 03/17   Nutritional Diagnosis:  NI-3.1  Inadequate fluid intake related to LAGB surgery as evidenced by patient consuming <50% of post-op fluid needs. NI-5.7.1  Inadequate protein intake related to LAGB surgery as evidenced by patient consuming ~ 65% of post-op protein needs.    Intervention:  Nutrition education/diet advancement.  Monitoring/Evaluation:  Dietary intake, exercise, lap band fills, and body weight. Follow up in 3 months for 6 month post-op visit.

## 2012-12-15 ENCOUNTER — Encounter (INDEPENDENT_AMBULATORY_CARE_PROVIDER_SITE_OTHER): Payer: Self-pay | Admitting: General Surgery

## 2012-12-15 ENCOUNTER — Ambulatory Visit (INDEPENDENT_AMBULATORY_CARE_PROVIDER_SITE_OTHER): Payer: BC Managed Care – PPO | Admitting: General Surgery

## 2012-12-15 VITALS — BP 118/70 | HR 92 | Temp 99.3°F | Resp 14 | Ht 67.0 in | Wt 250.2 lb

## 2012-12-15 DIAGNOSIS — Z9884 Bariatric surgery status: Secondary | ICD-10-CM

## 2012-12-15 NOTE — Patient Instructions (Signed)
1. Stay on liquids for the next 1-2 days as you adapt to your new fill volume.  Then resume your previous diet. 2. Decreasing your carbohydrate intake will hasten you weight loss.  Rely more on proteins for your meals.  Avoid condiments that contain sweets such as Honey Mustard and sugary salad dressings.   3. Stay in the "green zone".  If you are regurgitating with meals, having night time reflux, and find yourself eating soft comfort foods (mashed potatoes, potato chips)...realize that you are developing "maladaptive eating".  You will not lose weight this way and may regain weight.  The GREEN ZONE is eating smaller portions and not regurgitating.  Hence we may need to withdraw fluid from your band. 4. Build exercise into your daily routine.  Walking is the best way to start but do something every day if you can.   5. Eat with your non-dominant hand

## 2012-12-15 NOTE — Progress Notes (Signed)
Subjective:     Patient ID: Lori Montgomery, female   DOB: 03/12/1971, 42 y.o.   MRN: [id number]  HPI 42 year old Caucasian female status post laparoscopic adjustable gastric band placement on 10/04/2012 comes in today for her third postoperative appearance. I last saw her about 4 weeks ago. She denies any fever, chills, nausea, vomiting. She denies any regurgitation. She denies any morning cough or reflux. She states she can still eat large portions although she is physically limiting herself. She reports that she is walking at least 4 times a week up and down hills. She is changing her pace while  walking. She reports that she has made good food choices. She has eliminated all carbohydrates from her diet. She states she needs a release form for the on line Version of the BELT program.  She states that she has started to notice that sometimes meat will get stuck if she doesn't chew thoroughly. No problems with salads  Review of Systems     Objective:   Physical Exam BP 118/70  Pulse 92  Temp(Src) 99.3 F (37.4 C) (Oral)  Resp 14  Ht 5\' 7"  (1.702 m)  Wt 250 lb 3.2 oz (113.49 kg)  BMI 39.18 kg/m2  See LapBand flowsheet  Gen: alert, NAD, non-toxic appearing HEENT: normocephalic, atraumatic; pupils equal, no scleral icterus, neck supple, no lymphadenopathy Pulm: Lungs clear to auscultation, symmetric chest rise CV: regular rate and rhythm Abd: soft, nontender, nondistended. Well-healed trocar sites. No incisional hernia. Port is in right mid-abdomen Ext: no edema, normal, symmetric strength Neuro: nonfocal, sensation grossly intact Psych: appropriate, judgment normal     Assessment:     Status post laparoscopic adjustable gastric band placement     PHer total weight loss has been approximately 8 pounds.lan:     She has lost an additional 5 pounds since her last visit about 4 weeks ago. I congratulated her on her weight loss despite her not having much restriction. Her total weight  loss to date has been approximately 22 pounds. I encouraged her to continue exercising on a regular basis.  She was also given a prescription to the BELT program.  Since she doesn't have any restriction we performed a lap band adjustment today.  After obtaining verbal consent, the abdominal wall was prepped with Chloraprep. The port was accessed with a Huber needle and 0.5 cc of saline was added to give the patient an expected fill volume of 4 cc.  The patient was able to tolerate sips of water.  She was encouraged to eat with her non-dominant hand  Followup 4-6 weeks  [name]. Andrey Campanile, MD, FACS General, Bariatric, & Minimally Invasive Surgery [facility], [place]

## 2013-01-09 ENCOUNTER — Ambulatory Visit: Payer: BC Managed Care – PPO | Admitting: *Deleted

## 2013-01-12 ENCOUNTER — Ambulatory Visit (INDEPENDENT_AMBULATORY_CARE_PROVIDER_SITE_OTHER): Payer: BC Managed Care – PPO | Admitting: General Surgery

## 2013-01-12 ENCOUNTER — Encounter (INDEPENDENT_AMBULATORY_CARE_PROVIDER_SITE_OTHER): Payer: Self-pay | Admitting: General Surgery

## 2013-01-12 VITALS — BP 126/84 | HR 88 | Temp 98.7°F | Resp 16 | Ht 67.0 in | Wt 252.2 lb

## 2013-01-12 DIAGNOSIS — Z9884 Bariatric surgery status: Secondary | ICD-10-CM

## 2013-01-12 NOTE — Progress Notes (Signed)
Subjective:     Patient ID: Lori Montgomery, female   DOB: May 07, 1971, 42 y.o.   MRN: [id number]  HPI 42 year old Caucasian female status post laparoscopic adjustable gastric band placement on 10/04/2012 comes in today for her third postoperative appearance. I last saw her about 4 weeks ago on May 8. She denies any fever, chills, nausea, vomiting. She denies any regurgitation. She denies any morning cough or reflux. She states she can still eat large portions although she is physically limiting herself. She reports that she is walking at least 4 times a week up and down hills. She is changing her pace while  walking. She reports that she has made good food choices. She has eliminated all carbohydrates from her diet. She has started the on-line BELT program.  She is walking around 7-8,000 steps per day. When she initially started tracking her steps she was averaging 4000 steps per day.   She states that she has started to notice that sometimes meat will get stuck if she doesn't chew thoroughly. No problems with salads. Can eat 2 beef patties at 1 sitting or a large salad. BM every other day. Taking multivitamin. No reflux/nigthtime cough  Review of Systems     Objective:   Physical Exam BP 126/84  Pulse 88  Temp(Src) 98.7 F (37.1 C) (Temporal)  Resp 16  Ht 5\' 7"  (1.702 m)  Wt 252 lb 3.2 oz (114.397 kg)  BMI 39.49 kg/m2  See LapBand flowsheet  Gen: alert, NAD, non-toxic appearing HEENT: normocephalic, atraumatic; pupils equal, no scleral icterus, neck supple, no lymphadenopathy Pulm: Lungs clear to auscultation, symmetric chest rise CV: regular rate and rhythm Abd: soft, nontender, nondistended. Well-healed trocar sites. No incisional hernia. Port is in right mid-abdomen Ext: no edema, normal, symmetric strength Neuro: nonfocal, sensation grossly intact Psych: appropriate, judgment normal     Assessment:     Status post laparoscopic adjustable gastric band placement     PHer total  weight loss has been approximately 8 pounds.lan:     She has gained 2.2 pounds since her last visit about 4 weeks ago.Her total weight loss to date has been approximately 20 pounds. I encouraged her to continue exercising on a regular basis.  She was instructed to increase her water intake.   Since she doesn't have any restriction we performed a lap band adjustment today.  After obtaining verbal consent, the abdominal wall was prepped with Chloraprep. The port was accessed with a Huber needle and 0.75 cc of saline was added to give the patient an expected fill volume of 4.75 cc.  The patient was able to tolerate sips of water.  Liquid diet next 24 hrs. Call with problems with po tolerance  She was encouraged to eat with her non-dominant hand  Followup 4-6 weeks  [name]. Andrey Campanile, MD, FACS General, Bariatric, & Minimally Invasive Surgery [facility], [place]

## 2013-01-12 NOTE — Patient Instructions (Signed)
1. Stay on liquids for the next 1- 2 days as you adapt to your new fill volume.  Then resume your previous diet. 2. Decreasing your carbohydrate intake will hasten you weight loss.  Rely more on proteins for your meals.  Avoid condiments that contain sweets such as Honey Mustard and sugary salad dressings.   3. Stay in the "green zone".  If you are regurgitating with meals, having night time reflux, and find yourself eating soft comfort foods (mashed potatoes, potato chips)...realize that you are developing "maladaptive eating".  You will not lose weight this way and may regain weight.  The GREEN ZONE is eating smaller portions and not regurgitating.  Hence we may need to withdraw fluid from your band. 4. Build exercise into your daily routine.  Walking is the best way to start but do something every day if you can.   5. Increase your water intake 6. Try for 10,000 steps daily!

## 2013-02-22 ENCOUNTER — Ambulatory Visit (INDEPENDENT_AMBULATORY_CARE_PROVIDER_SITE_OTHER): Payer: BC Managed Care – PPO | Admitting: General Surgery

## 2013-02-22 ENCOUNTER — Encounter (INDEPENDENT_AMBULATORY_CARE_PROVIDER_SITE_OTHER): Payer: Self-pay | Admitting: General Surgery

## 2013-02-22 VITALS — BP 124/76 | HR 72 | Resp 14 | Ht 67.0 in | Wt 252.0 lb

## 2013-02-22 DIAGNOSIS — Z9884 Bariatric surgery status: Secondary | ICD-10-CM

## 2013-02-22 NOTE — Patient Instructions (Signed)
1. Stay on liquids for the next 2 days as you adapt to your new fill volume.  Then resume your previous diet. 2. Decreasing your carbohydrate intake will hasten you weight loss.  Rely more on proteins for your meals.  Avoid condiments that contain sweets such as Honey Mustard and sugary salad dressings.   3. Stay in the "green zone".  If you are regurgitating with meals, having night time reflux, and find yourself eating soft comfort foods (mashed potatoes, potato chips)...realize that you are developing "maladaptive eating".  You will not lose weight this way and may regain weight.  The GREEN ZONE is eating smaller portions and not regurgitating.  Hence we may need to withdraw fluid from your band. 4. Build exercise into your daily routine.  Walking is the best way to start but do something every day if you can.   5. Remember 30-30-30 rule - 30 chews, 30 sec to digest, 30 min to eat; use non-dominant hand, use tiny utensils, use timer

## 2013-02-22 NOTE — Progress Notes (Signed)
Subjective:     Patient ID: Lori Montgomery, female   DOB: 09-Oct-1970, 42 y.o.   MRN: [id number]  HPI 42 year old Caucasian female status post laparoscopic adjustable gastric band placement on 10/04/2012 comes in today for  postoperative appearance. I last saw her about 6 weeks ago on June 5. She denies any fever, chills, nausea, vomiting. She denies any regurgitation. She denies any morning cough or reflux. She states she can still eat large portions although she is physically limiting herself. She reports that she is walking at least 6 times a week in a park now. She is not changing her pace while  walking. She recently changed jobs and lovers her new job. She reports she has been eating out a lot recently. She has tried to make good food choices but her portion sizes remain large.  She has eliminated all carbohydrates from her diet. She is not doing the on-line BELT program because of a lack of communication from them.  She is no longer tracking her steps because she questioned the accuracy of her pedometer - it was counting steps when she wasn't walking.    She states she still has hunger. No problems with salads. Can eat 2 beef patties at 1 sitting or a large salad. BM every other day. Taking multivitamin. No reflux/nigthtime cough  Review of Systems     Objective:   Physical Exam BP 124/76  Pulse 72  Resp 14  Ht 5\' 7"  (1.702 m)  Wt 252 lb (114.306 kg)  BMI 39.46 kg/m2  See LapBand flowsheet  Gen: alert, NAD, non-toxic appearing HEENT: normocephalic, atraumatic; pupils equal, no scleral icterus, neck supple, no lymphadenopathy Pulm: Lungs clear to auscultation, symmetric chest rise CV: regular rate and rhythm Abd: soft, nontender, nondistended. Well-healed trocar sites. No incisional hernia. Port is in right mid-abdomen Ext: no edema, normal, symmetric strength Neuro: nonfocal, sensation grossly intact Psych: appropriate, judgment normal     Assessment:     Status post  laparoscopic adjustable gastric band placement     Plan:     She has lost no weight since her last visit about 6 weeks ago.Her total weight loss to date has been approximately 20 pounds. I encouraged her to continue exercising on a regular basis.  She was instructed to increase her water intake.   Since she doesn't have any restriction we performed a lap band adjustment today.  After obtaining verbal consent, the abdominal wall was prepped with Chloraprep. The port was accessed with a Huber needle and 1 cc of saline was added to give the patient an expected fill volume of 5.75 cc.  The patient was able to tolerate sips of water.  Liquid diet next 24 hrs. Call with problems with po tolerance  She was encouraged to eat with her non-dominant hand. We re-discussed food eating behaviors  Followup 4-6 weeks  [name]. Andrey Campanile, MD, FACS General, Bariatric, & Minimally Invasive Surgery [facility], [place]

## 2013-02-23 ENCOUNTER — Ambulatory Visit (INDEPENDENT_AMBULATORY_CARE_PROVIDER_SITE_OTHER): Payer: BC Managed Care – PPO | Admitting: General Surgery

## 2013-02-23 ENCOUNTER — Encounter (INDEPENDENT_AMBULATORY_CARE_PROVIDER_SITE_OTHER): Payer: Self-pay | Admitting: General Surgery

## 2013-02-23 VITALS — BP 124/76 | HR 68 | Temp 97.7°F | Resp 14 | Ht 67.0 in | Wt 252.0 lb

## 2013-02-23 DIAGNOSIS — Z9884 Bariatric surgery status: Secondary | ICD-10-CM

## 2013-02-23 DIAGNOSIS — R112 Nausea with vomiting, unspecified: Secondary | ICD-10-CM

## 2013-02-23 NOTE — Patient Instructions (Signed)
Stay on liquids for 48 hours

## 2013-02-23 NOTE — Progress Notes (Signed)
Subjective:     Patient ID: Lori Montgomery, female   DOB: Mar 07, 1971, 42 y.o.   MRN: [id number]  HPI  42 year old Caucasian female comes in one day after having a LAP-BAND adjustment in the office yesterday. Yesterday we added 1 cc to her band. She did well with the water in the office. However this morning she states that she had pretty bad reflux. She then tried to drink her morning coffee and could not tolerate it. She has been unable to tolerate sips of water. She has had worsening reflux and cough. She denies any fever, chills, abdominal pain.  Review of Systems     Objective:   Physical Exam BP 124/76  Pulse 68  Temp(Src) 97.7 F (36.5 C) (Temporal)  Resp 14  Ht 5\' 7"  (1.702 m)  Wt 252 lb (114.306 kg)  BMI 39.46 kg/m2 Alert, nad, nontoxic Soft, nt, nd. Port in rt mid abdomen    Assessment:     S/p LapBand PO intolerance - band too tight     Plan:     It appears her band is too tight.  After obtaining verbal consent, the abdominal wall was prepped with Chloraprep. The port was accessed with a Huber needle and 0.6 cc of saline was removed to give the patient an expected fill volume of 5.1 cc.  The patient was able to tolerate sips of water.  I instructed the patient to stay on liquids for the next 48 hours. She is to keep her regular scheduled followup appointment with me  [name]. Andrey Campanile, MD, FACS General, Bariatric, & Minimally Invasive Surgery [facility], [place]

## 2013-03-20 ENCOUNTER — Encounter: Payer: BC Managed Care – PPO | Attending: General Surgery | Admitting: *Deleted

## 2013-03-20 ENCOUNTER — Encounter: Payer: Self-pay | Admitting: *Deleted

## 2013-03-20 VITALS — Ht 67.0 in | Wt 251.5 lb

## 2013-03-20 DIAGNOSIS — Z713 Dietary counseling and surveillance: Secondary | ICD-10-CM | POA: Insufficient documentation

## 2013-03-20 DIAGNOSIS — Z01818 Encounter for other preprocedural examination: Secondary | ICD-10-CM | POA: Insufficient documentation

## 2013-03-20 DIAGNOSIS — E66813 Obesity, class 3: Secondary | ICD-10-CM

## 2013-03-20 NOTE — Patient Instructions (Addendum)
Goals:  Eat 3-6 small meals/snacks - NO MEAL SKIPPING!!   For the short term, try a protein shake at breakfast instead of skipping  May add 15 g of carbs per meal (fruit, whole grains)  Increase fluid intake to 64oz +  Avoid all caffeinated, carbonated, and sweetened beverages  Aim for >30 min of physical activity daily

## 2013-03-20 NOTE — Progress Notes (Signed)
  Follow-up visit:  12 Weeks Post-Operative LAGB Surgery  Medical Nutrition Therapy:  Appt start time: 1030   End time: 1100.  Primary concerns today: Post-operative Bariatric Surgery Nutrition Management. Has had several fills and some removed since surgery. Believes too much was taken out at last visit. Feels in the yellow. Breakfast skipping continues and she is eating ice cream and other carbs at night. Decreased fluids/protein intake continues and she is not taking her supplements. Has decreased soda intake from 24 to 16 oz a day. Is ready to get back on track. Discussed including breakfast, supplements, and exercise in her daily routine.      Surgery date: 10/04/12  Surgery type: LAGB  Start weight at [facility]: 253.6 lbs (03/13/11)  Pre-Op Class: 272.2 lbs (09/15/12)  Weight today: 251.5 lbs Weight change: 2.0 lbs Total weight lost: 20.7 lbs  TANITA  BODY COMP RESULTS  10/18/12 12/12/12 03/20/13   BMI (kg/m^2) 40.6 39.7 39.4   Fat Mass (lbs) 132.0 130.0 125.5   Fat Free Mass (lbs) 127.0 123.5 126.0   Total Body Water (lbs) 93.0 90.5 92.0   24-hr recall: B (AM): NONE Snk (AM): NONE  L (PM): 3-4 cups salad w/ chicken, beans, corn, mixed greens, & balsamic vin - [employer] (25g) Snk (PM): NONE D (6-6:30 PM): 2 oz carne asada (steak) - (15-18g) Snk (7 PM):  Ice cream (1 cup) - 8g  Fluid intake: Diet Dr. Hannah Beat Mtn Dew caffeine free - (16 oz), some water w/ Mio = 25-30 oz Estimated total protein intake: 40-50g  Medications: No changes.  Supplementation:  Not taking at this time.   Using straws: No Drinking while eating:  Sips w/ meals to get it down  Hair loss: No  Carbonated beverages: Yes; 16 oz daily N/V/D/C:  None, though takes Correctol daily as preventative. Cannot stomach fish, seafood, or yogurt d/t taste/texture Last Lap-Band fill:  12/15/12 - 0.5 cc added; 01/12/13 - 0.75 cc added; 02/22/13: 1.0 cc added -- 02/23/13:  0.6 cc removed d/t tightness. She now feels too much  was removed and is in the yellow zone.   Recent physical activity:  None  Progress Towards Goal(s):  In progress.   Nutritional Diagnosis:  NI-3.1  Inadequate fluid intake related to LAGB surgery as evidenced by patient consuming <50% of post-op fluid needs. NI-5.7.1  Inadequate protein intake related to LAGB surgery as evidenced by patient consuming ~ 65% of post-op protein needs.    Intervention:  Nutrition education/diet advancement.  Monitoring/Evaluation:  Dietary intake, exercise, lap band fills, and body weight. Follow up in 6 weeks for 7-8 month post-op visit.

## 2013-03-30 ENCOUNTER — Encounter (INDEPENDENT_AMBULATORY_CARE_PROVIDER_SITE_OTHER): Payer: BC Managed Care – PPO | Admitting: General Surgery

## 2013-04-06 ENCOUNTER — Encounter (INDEPENDENT_AMBULATORY_CARE_PROVIDER_SITE_OTHER): Payer: BC Managed Care – PPO | Admitting: General Surgery

## 2013-04-12 ENCOUNTER — Encounter (INDEPENDENT_AMBULATORY_CARE_PROVIDER_SITE_OTHER): Payer: BC Managed Care – PPO | Admitting: General Surgery

## 2013-04-13 ENCOUNTER — Encounter (INDEPENDENT_AMBULATORY_CARE_PROVIDER_SITE_OTHER): Payer: Self-pay

## 2013-04-13 ENCOUNTER — Ambulatory Visit (INDEPENDENT_AMBULATORY_CARE_PROVIDER_SITE_OTHER): Payer: BC Managed Care – PPO | Admitting: Physician Assistant

## 2013-04-13 VITALS — BP 128/84 | HR 66 | Temp 97.7°F | Resp 12 | Ht 67.0 in | Wt 249.8 lb

## 2013-04-13 DIAGNOSIS — Z4651 Encounter for fitting and adjustment of gastric lap band: Secondary | ICD-10-CM

## 2013-04-13 NOTE — Patient Instructions (Signed)

## 2013-04-13 NOTE — Progress Notes (Signed)
  HISTORY: Lori Montgomery is a 42 y.o.female who received an AP-Standard lap-band in February 2014 by Dr. Andrey Campanile. She comes in with 2 lbs weight loss since her last visit in mid-July. She is complaining of increased appetite and portion sizes. She's able to eat a meal fairly quickly as well. She denies regurgitation or reflux symtpoms.  VITAL SIGNS: Filed Vitals:   04/13/13 0928  BP: 128/84  Pulse: 66  Temp: 97.7 F (36.5 C)  Resp: 12    PHYSICAL EXAM: Physical exam reveals a very well-appearing 42 y.o.female in no apparent distress Neurologic: Awake, alert, oriented Psych: Bright affect, conversant Respiratory: Breathing even and unlabored. No stridor or wheezing Abdomen: Soft, nontender, nondistended to palpation. Incisions well-healed. No incisional hernias. Port easily palpated. Extremities: Atraumatic, good range of motion.  ASSESMENT: 42 y.o.  female  s/p AP-Standard lap-band.   PLAN: The patient's port was accessed with a 20G Huber needle without difficulty. Clear fluid was aspirated and 0.5 mL saline was added to the port to give a total predicted volume of 5.6 mL. The patient was able to swallow water without difficulty following the procedure and was instructed to take clear liquids for the next 24-48 hours and advance slowly as tolerated.

## 2013-04-24 ENCOUNTER — Telehealth (INDEPENDENT_AMBULATORY_CARE_PROVIDER_SITE_OTHER): Payer: Self-pay | Admitting: *Deleted

## 2013-04-24 NOTE — Telephone Encounter (Signed)
Patient called to report that when she has been laying down at night she has been having a burning/pain in her upper abdominal.  Patient states laying down increases the symptoms and sitting up decreases the symptoms.  Patient reports that last night she added another pillow which helped but did not completely take aware the burning.  Patient denies any issues during the day time.  Suggested to patient that this really sounds like GERD so she could try an OTC Nexium or Protonix to try to help with these symptoms.  Patient states that is what she thinks it is.  Patient states understanding and agreeable at this time.

## 2013-05-01 ENCOUNTER — Ambulatory Visit: Payer: BC Managed Care – PPO | Admitting: *Deleted

## 2013-05-18 ENCOUNTER — Encounter (INDEPENDENT_AMBULATORY_CARE_PROVIDER_SITE_OTHER): Payer: BC Managed Care – PPO

## 2013-05-25 ENCOUNTER — Encounter (INDEPENDENT_AMBULATORY_CARE_PROVIDER_SITE_OTHER): Payer: Self-pay | Admitting: Physician Assistant

## 2013-06-15 ENCOUNTER — Ambulatory Visit (INDEPENDENT_AMBULATORY_CARE_PROVIDER_SITE_OTHER): Payer: BC Managed Care – PPO | Admitting: Physician Assistant

## 2013-06-15 ENCOUNTER — Encounter (INDEPENDENT_AMBULATORY_CARE_PROVIDER_SITE_OTHER): Payer: Self-pay

## 2013-06-15 VITALS — BP 128/80 | HR 100 | Temp 98.9°F | Resp 16 | Ht 67.0 in | Wt 252.6 lb

## 2013-06-15 DIAGNOSIS — Z4651 Encounter for fitting and adjustment of gastric lap band: Secondary | ICD-10-CM

## 2013-06-15 NOTE — Patient Instructions (Signed)

## 2013-06-15 NOTE — Progress Notes (Signed)
  HISTORY: Lori Montgomery is a 42 y.o.female who received an AP-Standard lap-band in February 2014 by Dr. Andrey Campanile. She comes in with 3 lbs weight gain since her last visit and continued complaints of eating more than desired. Her hunger is also significant. She denies regurgitation or reflux. She does admit to eating carbohydrates more than she should and she isn't getting adequate exercise.  VITAL SIGNS: Filed Vitals:   06/15/13 1416  BP: 128/80  Pulse: 100  Temp: 98.9 F (37.2 C)  Resp: 16    PHYSICAL EXAM: Physical exam reveals a very well-appearing 42 y.o.female in no apparent distress Neurologic: Awake, alert, oriented Psych: Bright affect, conversant Respiratory: Breathing even and unlabored. No stridor or wheezing Abdomen: Soft, nontender, nondistended to palpation. Incisions well-healed. No incisional hernias. Port easily palpated. Extremities: Atraumatic, good range of motion.  ASSESMENT: 42 y.o.  female  s/p AP-Standard lap-band.   PLAN: The patient's port was accessed with a 20G Huber needle without difficulty. Clear fluid was aspirated and 0.25 mL saline was added to the port to give a total predicted volume of 5.85 mL. I confirmed the presence of 5.6 mL prior to the fill. The patient was able to swallow water without difficulty following the procedure and was instructed to take clear liquids for the next 24-48 hours and advance slowly as tolerated. I encouraged her to avoid carbohydrates and to increase exercise as these factors are certainly contributing to her weight gain. She voiced understanding and agreement.

## 2013-07-13 ENCOUNTER — Encounter (INDEPENDENT_AMBULATORY_CARE_PROVIDER_SITE_OTHER): Payer: BC Managed Care – PPO

## 2013-07-24 ENCOUNTER — Encounter (INDEPENDENT_AMBULATORY_CARE_PROVIDER_SITE_OTHER): Payer: Self-pay | Admitting: Physician Assistant

## 2013-08-05 ENCOUNTER — Telehealth (INDEPENDENT_AMBULATORY_CARE_PROVIDER_SITE_OTHER): Payer: Self-pay | Admitting: Surgery

## 2013-08-05 NOTE — Telephone Encounter (Signed)
Lori Montgomery is a lap band patient of Dr. Andrey Campanile.  For about 3 days, she has had trouble with pain when swallowing solids.  She does okay with liquids.  She has a appt for Dr. Andrey Campanile on Monday, 12/29.  I told her to stay on liquids until she sees Dr. Andrey Campanile on Monday.  She will call back if she has further problems.  D. [facility]

## 2013-08-07 ENCOUNTER — Encounter (INDEPENDENT_AMBULATORY_CARE_PROVIDER_SITE_OTHER): Payer: Self-pay | Admitting: General Surgery

## 2013-08-07 ENCOUNTER — Ambulatory Visit (INDEPENDENT_AMBULATORY_CARE_PROVIDER_SITE_OTHER): Payer: BC Managed Care – PPO | Admitting: General Surgery

## 2013-08-07 VITALS — BP 124/86 | HR 72 | Temp 97.4°F | Resp 14 | Ht 67.0 in | Wt 254.8 lb

## 2013-08-07 DIAGNOSIS — Z4651 Encounter for fitting and adjustment of gastric lap band: Secondary | ICD-10-CM

## 2013-08-07 DIAGNOSIS — K219 Gastro-esophageal reflux disease without esophagitis: Secondary | ICD-10-CM

## 2013-08-07 NOTE — Patient Instructions (Signed)
1. Stay on liquids for the next 2 days as you adapt to your new fill volume.  Then resume your previous diet. 2. Decreasing your carbohydrate intake will hasten you weight loss.  Rely more on proteins for your meals.  Avoid condiments that contain sweets such as Honey Mustard and sugary salad dressings.   3. Stay in the "green zone".  If you are regurgitating with meals, having night time reflux, and find yourself eating soft comfort foods (mashed potatoes, potato chips)...realize that you are developing "maladaptive eating".  You will not lose weight this way and may regain weight.  The GREEN ZONE is eating smaller portions and not regurgitating.  Hence we may need to withdraw fluid from your band. 4. Build exercise into your daily routine.  Walking is the best way to start but do something every day if you can.    Take the reflux medication at least once a day If have problems with solids return to liquids and call the office  Eating techniques 20-20-20 (30-30-30) 20 chews, 20 seconds between bites of food, 20 minutes to eat; sometimes you may need 30 chews, 30 seconds etc Use your nondominant hand to eat with Use a child/infant size utensil Try not to eat while watching TV

## 2013-08-07 NOTE — Progress Notes (Signed)
Subjective:     Patient ID: Lori Montgomery, female   DOB: Jun 24, 1971, 42 y.o.   MRN: [id number]  HPI 42 year old Caucasian female comes in for followup for her laparoscopic adjustable gastric band which was inserted on 10/04/2012. She last saw him and he on November 6. At that time she had a small amount of fluid inserted she had ongoing hunger as well as the ability to eat large portions. She states starting this past Thursday she developed difficulty with solids. She states that it was painful while eating solids. It felt like a burning stabbing sensation. The food eventually went down. She did not regurgitate. She was practicing good eating behaviors in the sense of taking small bites, chewing thoroughly, and waiting 20-30 seconds between bites of food. She had no difficulty with liquids. She will get with indigestion one morning. She denies any nighttime cough. She denies any abdominal pain. She denies any fever, chills, diarrhea or constipation. She is exercising some but not on a structured basis. She is mainly walking. She states that she is going to get a treadmill in a few weeks.  PMHx, PSHx, SOCHx, FAMHx, ALL reviewed and unchanged  Review of Systems 8 point ROS performed and negative except for what is mentioned in HPI    Objective:   Physical Exam BP 124/86  Pulse 72  Temp(Src) 97.4 F (36.3 C)  Resp 14  Ht 5\' 7"  (1.702 m)  Wt 254 lb 12.8 oz (115.577 kg)  BMI 39.90 kg/m2  See LapBand flowsheet  Gen: alert, NAD, non-toxic appearing HEENT: normocephalic, atraumatic; pupils equal, no scleral icterus, neck supple, no lymphadenopathy Pulm: Lungs clear to auscultation, symmetric chest rise CV: regular rate and rhythm Abd: soft, nontender, nondistended. Well-healed trocar sites. No incisional hernia. Port is in right mid-abdomen Ext: no edema, normal Neuro: nonfocal,  Psych: appropriate, judgment normal     Assessment:     Obesity Gastroesophageal reflux-dysphagia     Plan:      Because of her difficulty and pain with solids I recommended removing some fluid from her lap band.  After obtaining verbal consent, the abdominal wall was prepped with Chloraprep. The port was accessed with a Huber needle and 0.3 cc of saline was removed to give the patient an expected fill volume of 5.5 cc.  The patient was able to tolerate sips of water.  The patient was instructed to stay on liquids for the next 24-48 hours. Also instructed her to start taking an anti-reflux medication release once a day. She was instructed to contact the office should she have ongoing issues; otherwise we will see her in 4-6 weeks  [name]. Andrey Campanile, MD, FACS General, Bariatric, & Minimally Invasive Surgery [facility], [place]

## 2013-09-06 ENCOUNTER — Encounter (INDEPENDENT_AMBULATORY_CARE_PROVIDER_SITE_OTHER): Payer: BC Managed Care – PPO | Admitting: General Surgery

## 2013-09-08 ENCOUNTER — Encounter (INDEPENDENT_AMBULATORY_CARE_PROVIDER_SITE_OTHER): Payer: Self-pay | Admitting: General Surgery

## 2013-10-06 ENCOUNTER — Telehealth (INDEPENDENT_AMBULATORY_CARE_PROVIDER_SITE_OTHER): Payer: Self-pay | Admitting: General Surgery

## 2013-10-06 NOTE — Telephone Encounter (Signed)
LMOM for patient to call back and ask for Deneise Lever I need to make her an apt with Dr [name]

## 2013-10-12 ENCOUNTER — Encounter (INDEPENDENT_AMBULATORY_CARE_PROVIDER_SITE_OTHER): Payer: Self-pay | Admitting: General Surgery

## 2013-10-12 ENCOUNTER — Other Ambulatory Visit (INDEPENDENT_AMBULATORY_CARE_PROVIDER_SITE_OTHER): Payer: Self-pay | Admitting: General Surgery

## 2013-10-12 ENCOUNTER — Ambulatory Visit (INDEPENDENT_AMBULATORY_CARE_PROVIDER_SITE_OTHER): Payer: BC Managed Care – PPO | Admitting: General Surgery

## 2013-10-12 VITALS — BP 148/82 | HR 82 | Temp 99.3°F | Resp 18 | Ht 67.0 in | Wt 262.4 lb

## 2013-10-12 DIAGNOSIS — Z9884 Bariatric surgery status: Secondary | ICD-10-CM

## 2013-10-12 DIAGNOSIS — Z4651 Encounter for fitting and adjustment of gastric lap band: Secondary | ICD-10-CM

## 2013-10-12 DIAGNOSIS — R109 Unspecified abdominal pain: Secondary | ICD-10-CM

## 2013-10-12 NOTE — Progress Notes (Signed)
Subjective:     Patient ID: Rollene Fare, female   DOB: 06-Sep-1970, 43 y.o.   MRN: [id number]  HPI 43 year old Caucasian female comes in for long-term followup after undergoing laparoscopic adjustable gastric band procedure on 10/04/2012. She was last seen in the office on December 29. At that time she was having some pain and difficulty with eating solid foods and some fluid was removed from her band. She states she has ongoing difficulty with solid foods. Liquids go down without a problem. If the solids are any more consistent than applesauce it will cause pain as well as regurgitation. She denies any nighttime cough or wheezing. She is taking her supplements. She is not drinking liquids with meals. She states that she is exercising regularly. She states that she is taking one small bite at a time, chewing 20-30 seconds, and waiting 20-30 seconds between bites of food. She is also frustrated with her ongoing issues with the band as well as her lack of adequate weight loss. She is also complaining about pain around her port site. She denies any redness. She states area is just sore and tender  PMHx, PSHx, SOCHx, FAMHx, ALL reviewed and unchanged  Review of Systems 10 point ROS performed and negative excpet for HPI    Objective:   Physical Exam BP 148/82  Pulse 82  Temp(Src) 99.3 F (37.4 C) (Oral)  Resp 18  Ht 5\' 7"  (1.702 m)  Wt 262 lb 6.4 oz (119.024 kg)  BMI 41.09 kg/m2  See LapBand flowsheet  Gen: alert, NAD, non-toxic appearing HEENT: normocephalic, atraumatic; pupils equal, no scleral icterus, neck supple, no lymphadenopathy Pulm: Lungs clear to auscultation, symmetric chest rise CV: regular rate and rhythm Abd: soft, some tenderness around port, nondistended. Well-healed trocar sites. No incisional hernia. Port is in right mid-abdomen Ext: no edema, normal, symmetric strength Neuro: nonfocal, sensation grossly intact Psych: appropriate, judgment normal     Assessment:      Status post laparoscopic adjustable gastric band, AP standard Morbid obesity BMI 41.1 Elevated blood pressure     Plan:     She is almost back to her preoperative weight. Her preoperative weight was 267 pounds.   With her having ongoing difficulty and regurgitation I recommended an adjustment.  After obtaining verbal consent, the abdominal wall was prepped with Chloraprep. The port was accessed with a Huber needle and 1.5 cc of saline was removed to give the patient an expected fill volume of 4 cc.  The patient was able to tolerate sips of water.  She was instructed to stay on liquids for the next 24 hrs.   We discussed the importance of ongoing regular exercise.  I recognized her frustration of adequate weight loss. It appears that she is practicing good eating techniques and behaviors. I do believe we need to do an upper GI to evaluate band position and anatomy.  We briefly discussed conversion to other procedures such as a Roux-en-Y gastric bypass or sleeve gastrectomy. Her mother had a gastric bypass.  I have asked her to watch the EMMI videos  as well as to watch the online seminar on sleeve gastrectomy as well as gastric bypass. We will bring her back in several weeks after her upper GI and after she has watched these to further evaluate her for conversion to another weight loss procedure.  She was also instructed to let us know if she has ongoing intolerance to solid food  [name]. [name], MD, FACS General, Bariatric, & Minimally  Invasive Surgery [facility], [place]

## 2013-10-12 NOTE — Patient Instructions (Signed)
Watch EMMI videos on sleeve gastrectomy and gastric bypass Get Upper GI Watch on-line seminar for weight loss surgery on [email or web address] - follow links under bariatric surgery to view and register for on-line seminar  1. Stay on liquids for the next 2 days as you adapt to your new fill volume.  Then resume your previous diet. 2. Decreasing your carbohydrate intake will hasten you weight loss.  Rely more on proteins for your meals.  Avoid condiments that contain sweets such as Honey Mustard and sugary salad dressings.   3. Stay in the "green zone".  If you are regurgitating with meals, having night time reflux, and find yourself eating soft comfort foods (mashed potatoes, potato chips)...realize that you are developing "maladaptive eating".  You will not lose weight this way and may regain weight.  The GREEN ZONE is eating smaller portions and not regurgitating.  Hence we may need to withdraw fluid from your band. 4. Build exercise into your daily routine.  Walking is the best way to start but do something every day if you can.

## 2013-10-13 ENCOUNTER — Ambulatory Visit
Admission: RE | Admit: 2013-10-13 | Discharge: 2013-10-13 | Disposition: A | Discharge status: Home / Self Care | Payer: BC Managed Care – PPO | Source: Ambulatory Visit | Attending: General Surgery | Admitting: General Surgery

## 2013-10-13 DIAGNOSIS — R109 Unspecified abdominal pain: Secondary | ICD-10-CM

## 2013-10-13 DIAGNOSIS — Z9884 Bariatric surgery status: Secondary | ICD-10-CM

## 2013-10-18 ENCOUNTER — Telehealth (INDEPENDENT_AMBULATORY_CARE_PROVIDER_SITE_OTHER): Payer: Self-pay | Admitting: General Surgery

## 2013-10-18 NOTE — Telephone Encounter (Signed)
Called and Lori Montgomery stated Lori Montgomery was in the ED with SOB and Lori Montgomery called and wanted to know what to do, does Lori Montgomery need to come in to see Dr [name] or see her PCP. Per Dr [name] Lori Montgomery will need to see her PCP

## 2013-10-26 ENCOUNTER — Ambulatory Visit (INDEPENDENT_AMBULATORY_CARE_PROVIDER_SITE_OTHER): Payer: BC Managed Care – PPO | Admitting: General Surgery

## 2013-11-01 ENCOUNTER — Ambulatory Visit (INDEPENDENT_AMBULATORY_CARE_PROVIDER_SITE_OTHER): Payer: BC Managed Care – PPO | Admitting: General Surgery

## 2013-11-01 ENCOUNTER — Encounter (INDEPENDENT_AMBULATORY_CARE_PROVIDER_SITE_OTHER): Payer: Self-pay | Admitting: General Surgery

## 2013-11-01 ENCOUNTER — Other Ambulatory Visit (INDEPENDENT_AMBULATORY_CARE_PROVIDER_SITE_OTHER): Payer: Self-pay | Admitting: General Surgery

## 2013-11-01 VITALS — HR 80 | Temp 97.4°F | Resp 16 | Ht 67.0 in | Wt 267.6 lb

## 2013-11-01 LAB — COMPREHENSIVE METABOLIC PANEL
ALT: 19 U/L (ref 0–35)
AST: 15 U/L (ref 0–37)
Albumin: 4 g/dL (ref 3.5–5.2)
Alkaline Phosphatase: 52 U/L (ref 39–117)
BUN: 9 mg/dL (ref 6–23)
CALCIUM: 9 mg/dL (ref 8.4–10.5)
CHLORIDE: 105 meq/L (ref 96–112)
CO2: 25 mEq/L (ref 19–32)
Creat: 0.67 mg/dL (ref 0.50–1.10)
Glucose, Bld: 86 mg/dL (ref 70–99)
Potassium: 4.3 mEq/L (ref 3.5–5.3)
Sodium: 138 mEq/L (ref 135–145)
Total Bilirubin: 0.4 mg/dL (ref 0.2–1.2)
Total Protein: 6.7 g/dL (ref 6.0–8.3)

## 2013-11-01 LAB — LIPID PANEL
CHOL/HDL RATIO: 4 ratio
CHOLESTEROL: 228 mg/dL — AB (ref 0–200)
HDL: 57 mg/dL (ref >39)
LDL Cholesterol: 147 mg/dL — ABNORMAL HIGH (ref 0–99)
TRIGLYCERIDES: 121 mg/dL (ref <150)
VLDL: 24 mg/dL (ref 0–40)

## 2013-11-01 LAB — CBC
HCT: 41 % (ref 36.0–46.0)
Hemoglobin: 13.9 g/dL (ref 12.0–15.0)
MCH: 30.7 pg (ref 26.0–34.0)
MCHC: 33.9 g/dL (ref 30.0–36.0)
MCV: 90.5 fL (ref 78.0–100.0)
Platelets: 284 10*3/uL (ref 150–400)
RBC: 4.53 MIL/uL (ref 3.87–5.11)
RDW: 13.1 % (ref 11.5–15.5)
WBC: 6.9 10*3/uL (ref 4.0–10.5)

## 2013-11-01 LAB — TSH: TSH: 2.348 u[IU]/mL (ref 0.350–4.500)

## 2013-11-01 NOTE — Progress Notes (Signed)
Patient ID: Lori Montgomery, female   DOB: 05-24-1971, 43 y.o.   MRN: [id number]  Chief Complaint  Patient presents with  . Bariatric Follow Up    rechk gastric band    HPI Lori Montgomery is a 43 y.o. female.   HPI 43 year old morbidly obese Caucasian female comes in for short interval followup. I saw her a few weeks ago when she was having difficulty with her lap band. She is having difficulty swallowing. We removed some fluid and she was sent for an upper GI. She states the food intolerance has resolved. She still has some reflux. She denies any nighttime cough. She denies any difficulty with solids or liquids. She did have an experience of trouble breathing a few weeks ago that prompted her to go to the emergency room. She was very hypertensive however her blood pressure was normal at the time of discharge. Her workup for shortness of breath was negative per the patient. She was thought to have a panic attack. She denies any short of breath, chest pain, chest pressure.  She reports that she is walking daily. She is in the process of moving. Despite walking daily she is not satisfied with her weight loss. In fact she is now back at her preoperative weight. She states that she is making good food choices. She is interested in having her band converted to a gastric bypass. She states that she watched the on line seminar since her last visit. She also watched the EMMI video on gastric bypass as well as Sleeve gastrectomy. Past Medical History  Diagnosis Date  . Depression   . Degenerative joint disease   . Gout   . Morbid obesity with BMI of 40.0-44.9, adult   . H/O hiatal hernia   . PONV (postoperative nausea and vomiting)     states also had localized reaction to IV meds during/following endoscopy 2011 requiring IV Benadryl- ? location of test    Past Surgical History  Procedure Laterality Date  . Breast reduction surgery  2008    with scar revision  . Laparoscopic cholecystectomy  03/2010   Dr Johney Maine  . Knee arthroscopy      right knee/ with ACL REPAIR  . Laparoscopic gastric banding N/A 10/04/2012    Procedure: LAPAROSCOPIC GASTRIC BANDING;  Surgeon: [name], MD,FACS;  Location: [facility];  Service: General;  Laterality: N/A;  Laparoscopic Adjustable Gastric Banding,   . Mesh applied to lap port N/A 10/04/2012    Procedure: MESH APPLIED TO LAP PORT;  Surgeon: [name], MD,FACS;  Location: [facility];  Service: General;  Laterality: N/A;    Family History  Problem Relation Age of Onset  . Diabetes Mother   . Lung cancer Maternal Grandmother   . Breast cancer Maternal Aunt   . Diabetes Father   . Heart disease Paternal Grandmother   . Heart disease Paternal Grandfather     Social History History  Substance Use Topics  . Smoking status: Former Smoker    Quit date: 01/23/2011  . Smokeless tobacco: Never Used  . Alcohol Use: Yes     Comment: rarely    No Known Allergies  Current Outpatient Prescriptions  Medication Sig Dispense Refill  . ARIPiprazole (ABILIFY) 5 MG tablet Take 5 mg by mouth every morning.       . venlafaxine (EFFEXOR-XR) 150 MG 24 hr capsule Take 150 mg by mouth every morning. Take with 75 mg to make 225 mg      . venlafaxine (  EFFEXOR-XR) 75 MG 24 hr capsule Take 75 mg by mouth every morning. Take with 150 mg to make 225 mg       No current facility-administered medications for this visit.    Review of Systems Review of Systems  Constitutional: Negative for fever, activity change, appetite change and unexpected weight change.  HENT: Negative for nosebleeds and trouble swallowing.   Eyes: Negative for photophobia and visual disturbance.  Respiratory: Negative for chest tightness and shortness of breath.   Cardiovascular: Negative for chest pain and leg swelling.       Denies CP, SOB, orthopnea, PND, DOE  Gastrointestinal:       Reflux, some pain around port  Genitourinary: Negative for dysuria and difficulty urinating.  Musculoskeletal:  Negative for arthralgias.  Skin: Negative for pallor and rash.  Neurological: Negative for dizziness, seizures, facial asymmetry and numbness.       Denies TIA and amaurosis fugax   Hematological: Negative for adenopathy. Does not bruise/bleed easily.  Psychiatric/Behavioral: Negative for behavioral problems and agitation.    Pulse 80, temperature 97.4 F (36.3 C), resp. rate 16, height 5\' 7"  (1.702 m), weight 267 lb 9.6 oz (121.383 kg), last menstrual period 09/19/2013.  Physical Exam Physical Exam  Vitals reviewed. Constitutional: She is oriented to person, place, and time. She appears well-developed and well-nourished. No distress.  HENT:  Head: Normocephalic and atraumatic.  Eyes: Conjunctivae are normal. No scleral icterus.  Neck: Normal range of motion.  Pulmonary/Chest: Effort normal.  Abdominal: She exhibits no distension.  Musculoskeletal: She exhibits no edema and no tenderness.  Neurological: She is alert and oriented to person, place, and time.  Skin: Skin is warm and dry. No rash noted. She is not diaphoretic. No erythema. No pallor.  Psychiatric: She has a normal mood and affect. Her behavior is normal. Judgment and thought content normal.    Data Reviewed UGI results -No evidence of band slipped, enlarged pouch  Assessment    Morbid obesity BMI 41.9 Gastroesophageal reflux disease Degenerative joint disease History of laparoscopic adjustable gastric band    Plan    We first reviewed her results from her upper GI. There is no evidence of band slip or band erosion or enlarged pouch. Nonetheless she is not satisfied with her weight loss. She is interested in band conversion. She is watched the on line [place] as well as the additional educational video. I do think it's reasonable to offer her conversion from a gastric adjustable band to a laparoscopic Roux-en-Y gastric bypass.  The patient meets weight loss surgery criteria. I think the patient would be an  acceptable candidate for conversion to Laparoscopic Roux-en-Y Gastric bypass.   We discussed laparoscopic Roux-en-Y gastric bypass. We discussed the preoperative, operative and postoperative process. Using diagrams, I explained the surgery in detail including the performance of an EGD near the end of the surgery and an Upper GI swallow study on POD 1. We discussed the typical hospital course including a 2-3 day stay baring any complications.   The patient was given educational material. I quoted the patient that they can expect to lose 50-60% of their excess weight with the gastric bypass. We did discuss the possibility of weight regain several years after the procedure.  We discussed the risk and benefits of surgery including but not limited to anesthesia risk, bleeding, infection, anastomotic edema requiring a few additional days in the hospital, postop nausea, possible conversion to open procedure, blood clot formation, anastomotic leak, anastomotic stricture, ulcer formation,  death, respiratory complications, intestinal blockage, internal hernia, gallstone formation, vitamin and nutritional deficiencies, hair loss, weight regain injury to surrounding structures, failure to lose weight and mood changes. We did explain that during a revisional surgery the risk of leak, readmission within 30 days, anastomotic edema is higher.I also explained that if there is complications during band removal that we may not proceed with same day Roux-en-Y gastric bypass  We discussed that before and after surgery that there would be an alteration in their diet. I explained that we have put them on a diet 2 weeks before surgery. I also explained that they would be on a liquid diet for 2 weeks after surgery. We discussed that they would have to avoid certain foods such as sugar after surgery. We discussed the importance of physical activity as well as compliance with our dietary and supplement recommendations and routine  follow-up.  I explained to the patient that we will start our evaluation process which includes labs, nutritionist consultation, psychiatrist consultation, EKG, CXR.  [name]. [name], MD, FACS General, Bariatric, & Minimally Invasive Surgery [facility], [place]             [facility] M 11/01/2013, 9:59 AM

## 2013-11-01 NOTE — Patient Instructions (Signed)
We will start the workup.  Exercise daily Resume daily nexium  Congratulations on starting your journey to a healthier life! Over the next few weeks you will be undergoing tests (x-rays and labs) and seeing specialists to help evaluate you for weight loss surgery.  These tests and consultations with a psychologist and nutritionist are needed to prepare you for the lifestyle changes that lie ahead and are often required by insurance companies to approve you for surgery.   Pathway to Surgery:  Over the next few weeks -->Lab work -->Radiology tests   - Chest x-ray - make sure your lungs are normal before surgery  - Upper GI - you drink barium and pictures are taken as it travels down your  esophagus and into your stomach - looks for reflux and a hiatal hernia which may  need to repaired at the same time as your weight loss surgery  - Abdominal Ultrasound - looks at your gallbladder and liver  - Mammogram - up to date mammogram if you are a female -->EKG  -->Sleep study - if you are felt to be at high risk for obstructive sleep apnea -->H. Pylori breath test ([employer]) - you surgeon may order this test to see if you have  a bacteria (H pylori) in your stomach which makes you at higher risk to develop a  ulcer or inflammation of your stomach -->Nutrition consultation -->Psychologist consultation -->Other specialist consults - your surgeon may determine that you need to see a  specialist like a cardiologist or pulmnologist depending on your health history -->Watch EMMI video about your planned weight loss surgery  Two weeks prior to surgery  Go on the extremely low carb liquid diet - this will decrease the size of your liver  which will make surgery safer  Attend preoperative appointment with your surgeon  Attend preoperative surgery class  One week prior to surgery  No aspirin products.  Tylenol is acceptable   24 hours prior to surgery  No alcoholic beverages  Report fever greater than  100.5 or excessive nasal drainage suggesting infection  Continue bariatric preop diet  Perform bowel prep if ordered  Do not eat or drink anything after midnight the night before surgery  Do not take any medications except those instructed by the anesthesiologist  Morning of surgery  Please arrive at the hospital at least 2 hours before your scheduled surgery time.  No makeup, fingernail polish or jewelry  Bring insurance cards with you  Bring your CPAP mask if you use this

## 2013-11-02 LAB — H. PYLORI ANTIBODY, IGG: H PYLORI IGG: 0.77 {ISR}

## 2013-11-09 ENCOUNTER — Ambulatory Visit (INDEPENDENT_AMBULATORY_CARE_PROVIDER_SITE_OTHER): Payer: BC Managed Care – PPO | Admitting: General Surgery

## 2013-11-09 ENCOUNTER — Ambulatory Visit (HOSPITAL_COMMUNITY)
Admission: RE | Admit: 2013-11-09 | Discharge: 2013-11-09 | Disposition: A | Discharge status: Home / Self Care | Payer: BC Managed Care – PPO | Source: Ambulatory Visit | Attending: General Surgery | Admitting: General Surgery

## 2013-11-09 DIAGNOSIS — Z6841 Body Mass Index (BMI) 40.0 and over, adult: Secondary | ICD-10-CM | POA: Insufficient documentation

## 2013-11-09 DIAGNOSIS — M1990 Unspecified osteoarthritis, unspecified site: Secondary | ICD-10-CM | POA: Insufficient documentation

## 2013-11-09 DIAGNOSIS — F329 Major depressive disorder, single episode, unspecified: Secondary | ICD-10-CM | POA: Insufficient documentation

## 2013-11-09 DIAGNOSIS — F3289 Other specified depressive episodes: Secondary | ICD-10-CM | POA: Insufficient documentation

## 2013-11-09 DIAGNOSIS — M109 Gout, unspecified: Secondary | ICD-10-CM | POA: Insufficient documentation

## 2013-11-09 DIAGNOSIS — K449 Diaphragmatic hernia without obstruction or gangrene: Secondary | ICD-10-CM | POA: Insufficient documentation

## 2013-11-20 ENCOUNTER — Other Ambulatory Visit (INDEPENDENT_AMBULATORY_CARE_PROVIDER_SITE_OTHER): Payer: Self-pay | Admitting: General Surgery

## 2013-12-16 ENCOUNTER — Encounter: Payer: Self-pay | Admitting: Dietician

## 2013-12-16 ENCOUNTER — Encounter: Payer: BC Managed Care – PPO | Attending: General Surgery | Admitting: Dietician

## 2013-12-16 VITALS — Ht 67.5 in | Wt 270.2 lb

## 2013-12-16 DIAGNOSIS — Z713 Dietary counseling and surveillance: Secondary | ICD-10-CM | POA: Insufficient documentation

## 2013-12-16 DIAGNOSIS — Z01818 Encounter for other preprocedural examination: Secondary | ICD-10-CM | POA: Insufficient documentation

## 2013-12-16 NOTE — Progress Notes (Signed)
Samples given and patient instructed on proper usage:  Unjury protein powder (Unflavored qty 2) Lot#: [id number] Exp: 07/2014  Unjury protein powder (Chicken soup qty 2)  Lot #: [id number] Exp: 07/2014  Lot #: [id number] Exp: 10/2014

## 2013-12-16 NOTE — Progress Notes (Signed)
  Pre-Op Assessment Visit:  Pre-Operative RYGB Surgery  Medical Nutrition Therapy:  Appt start time: 4196   End time:  2229.  Patient was seen on 12/16/2013 for Pre-Operative RYGB Nutrition Assessment. Assessment and letter of approval faxed to [facility] Bariatric Surgery Program coordinator on 12/16/2013.   Preferred Learning Style:   No preference indicated   Learning Readiness:   Ready  Handouts given during visit include:  Pre-Op Goals Bariatric Surgery Protein Shakes  Teaching Method Utilized:  Visual Auditory Hands on  Barriers to learning/adherence to lifestyle change: none  Demonstrated degree of understanding via:  Teach Back   Patient to call the [facility] to enroll in Pre-Op and Post-Op Nutrition Education when surgery date is scheduled.

## 2014-01-18 ENCOUNTER — Other Ambulatory Visit (INDEPENDENT_AMBULATORY_CARE_PROVIDER_SITE_OTHER): Payer: Self-pay | Admitting: General Surgery

## 2014-01-18 ENCOUNTER — Ambulatory Visit (INDEPENDENT_AMBULATORY_CARE_PROVIDER_SITE_OTHER): Payer: BC Managed Care – PPO | Admitting: General Surgery

## 2014-01-24 ENCOUNTER — Telehealth (INDEPENDENT_AMBULATORY_CARE_PROVIDER_SITE_OTHER): Payer: Self-pay | Admitting: General Surgery

## 2014-01-24 NOTE — Telephone Encounter (Signed)
LMOM for patient to call back and ask for Lori Montgomery so I can make her an pre-op apt and an post op apt

## 2014-02-12 ENCOUNTER — Encounter: Payer: BC Managed Care – PPO | Attending: General Surgery

## 2014-02-12 VITALS — Ht 67.5 in | Wt 283.5 lb

## 2014-02-12 DIAGNOSIS — Z713 Dietary counseling and surveillance: Secondary | ICD-10-CM | POA: Insufficient documentation

## 2014-02-12 DIAGNOSIS — Z01818 Encounter for other preprocedural examination: Secondary | ICD-10-CM | POA: Insufficient documentation

## 2014-02-12 NOTE — Patient Instructions (Signed)
Follow:   Pre-Op Diet per MD 2 weeks prior to surgery  Phase 2- Liquids (clear/full) 2 weeks after surgery  Vitamin/Mineral/Calcium guidelines for purchasing bariatric supplements  Exercise guidelines pre and post-op per MD  Follow-up at [facility] in 2 weeks post-op for diet advancement.  

## 2014-02-12 NOTE — Progress Notes (Signed)
  Pre-Operative Nutrition Class:  Appt start time: 830   End time:  930.  Patient was seen on 02/12/2014 for Pre-Operative Bariatric Surgery Education at the [facility].   Surgery date: 02/27/2014 Surgery type: Sleeve Start weight at [facility]: 270 lbs on 12/16/2013 Weight today: 283.5 lbs  TANITA  BODY COMP RESULTS  02/12/14   BMI (kg/m^2) 43.7   Fat Mass (lbs) 147.5   Fat Free Mass (lbs) 136.0   Total Body Water (lbs) 99.5   Samples given per MNT protocol. Patient educated on appropriate usage:  Bariatric Advantage Calcium Citrate (Chocolate) Lot # [id number] Exp:01/2015  Celebrate Vitamins Multivitamin (Mandarin Alsip) Lot # [id number] Exp: 01/2015  Renee Pain Protein Powder (Chocolate) Lot # [id number] Exp: 01/2015  Premier Protein Zonia Kief) Lot # [id number] Exp: 05/2014  The following the learning objectives were met by the patient during this course:  Identify Pre-Op Dietary Goals and will begin 2 weeks pre-operatively  Identify appropriate sources of fluids and proteins   State protein recommendations and appropriate sources pre and post-operatively  Identify Post-Operative Dietary Goals and will follow for 2 weeks post-operatively  Identify appropriate multivitamin and calcium sources  Describe the need for physical activity post-operatively and will follow MD recommendations  State when to call healthcare provider regarding medication questions or post-operative complications  Handouts given during class include:  Pre-Op Bariatric Surgery Diet Handout  Protein Shake Handout  Post-Op Bariatric Surgery Nutrition Handout  BELT Program Information Flyer  Support Group Information Flyer  [facility] Bariatric Supplements Price List  Follow-Up Plan: Patient will follow-up at [facility] 2 weeks post operatively for diet advancement per MD.  The following the learning objectives were met by the patient during this course:  Identify Pre-Op  Dietary Goals and will begin 2 weeks pre-operatively  Identify appropriate sources of fluids and proteins   State protein recommendations and appropriate sources pre and post-operatively  Identify Post-Operative Dietary Goals and will follow for 2 weeks post-operatively  Identify appropriate multivitamin and calcium sources  Describe the need for physical activity post-operatively and will follow MD recommendations  State when to call healthcare provider regarding medication questions or post-operative complications  Handouts given during class include:  Pre-Op Bariatric Surgery Diet Handout  Protein Shake Handout  Post-Op Bariatric Surgery Nutrition Handout  BELT Program Information Flyer  Support Group Information Flyer  [facility] Bariatric Supplements Price List  Follow-Up Plan: Patient will follow-up at [facility] 2 weeks post operatively for diet advancement per MD.

## 2014-02-13 ENCOUNTER — Encounter (HOSPITAL_COMMUNITY): Payer: Self-pay | Admitting: Pharmacy Technician

## 2014-02-15 ENCOUNTER — Other Ambulatory Visit (HOSPITAL_COMMUNITY): Payer: Self-pay | Admitting: *Deleted

## 2014-02-15 NOTE — Patient Instructions (Signed)
Lori Montgomery  02/15/2014                           YOUR PROCEDURE IS SCHEDULED ON:  02/27/14 AT 9:45 AM               ENTER [place] ENTRANCE AND                            FOLLOW  SIGNS TO SHORT STAY CENTER                 ARRIVE AT SHORT STAY AT: 7:45 AM               CALL THIS NUMBER IF ANY PROBLEMS THE DAY OF SURGERY :               [phone]                                REMEMBER:   Do not eat food or drink liquids AFTER MIDNIGHT                  Take these medicines the morning of surgery with               A SIPS OF WATER :         Do not wear jewelry, make-up   Do not wear lotions, powders, or perfumes.   Do not shave legs or underarms 12 hrs. before surgery (men may shave face)  Do not bring valuables to the hospital.  Contacts, dentures or bridgework may not be worn into surgery.  Leave suitcase in the car. After surgery it may be brought to your room.  For patients admitted to the hospital more than one night, checkout time is            11:00 AM                                                     ________________________________________________________________________                                                                        [place]  Before surgery, you can play an important role.  Because skin is not sterile, your skin needs to be as free of germs as possible.  You can reduce the number of germs on your skin by washing with CHG (chlorahexidine gluconate) soap before surgery.  CHG is an antiseptic cleaner which kills germs and bonds with the skin to continue killing germs even after washing. Please DO NOT use if you have an allergy to CHG or antibacterial soaps.  If your skin becomes reddened/irritated stop using the CHG and inform your nurse when you arrive at Short Stay. Do not shave (including legs and underarms) for at least 48 hours prior to the first CHG shower.  You may shave your  face. Please follow these instructions carefully:   1.  Shower with CHG Soap the night before surgery and the  morning of Surgery.   2.  If you choose to wash your hair, wash your hair first as usual with your  normal  Shampoo.   3.  After you shampoo, rinse your hair and body thoroughly to remove the  shampoo.                                         4.  Use CHG as you would any other liquid soap.  You can apply chg directly  to the skin and wash . Gently wash with scrungie or clean wascloth    5.  Apply the CHG Soap to your body ONLY FROM THE NECK DOWN.   Do not use on open                           Wound or open sores. Avoid contact with eyes, ears mouth and genitals (private parts).                        Genitals (private parts) with your normal soap.              6.  Wash thoroughly, paying special attention to the area where your surgery  will be performed.   7.  Thoroughly rinse your body with warm water from the neck down.   8.  DO NOT shower/wash with your normal soap after using and rinsing off  the CHG Soap .                9.  Pat yourself dry with a clean towel.             10.  Wear clean pajamas.             11.  Place clean sheets on your bed the night of your first shower and do not  sleep with pets.  Day of Surgery : Do not apply any lotions/deodorants the morning of surgery.  Please wear clean clothes to the hospital/surgery center.  FAILURE TO FOLLOW THESE INSTRUCTIONS MAY RESULT IN THE CANCELLATION OF YOUR SURGERY    PATIENT SIGNATURE_________________________________  ______________________________________________________________________

## 2014-02-19 ENCOUNTER — Inpatient Hospital Stay (HOSPITAL_COMMUNITY)
Admission: RE | Admit: 2014-02-19 | Discharge: 2014-02-19 | Disposition: A | Discharge status: Home / Self Care | Payer: BC Managed Care – PPO | Source: Ambulatory Visit

## 2014-02-21 ENCOUNTER — Inpatient Hospital Stay (HOSPITAL_COMMUNITY): Admission: RE | Admit: 2014-02-21 | Payer: BC Managed Care – PPO | Source: Ambulatory Visit

## 2014-02-21 ENCOUNTER — Ambulatory Visit (INDEPENDENT_AMBULATORY_CARE_PROVIDER_SITE_OTHER): Payer: BC Managed Care – PPO | Admitting: General Surgery

## 2014-02-21 ENCOUNTER — Encounter (INDEPENDENT_AMBULATORY_CARE_PROVIDER_SITE_OTHER): Payer: Self-pay | Admitting: General Surgery

## 2014-02-21 VITALS — BP 126/82 | HR 108 | Temp 97.1°F | Ht 67.0 in | Wt 282.0 lb

## 2014-02-21 MED ORDER — OXYCODONE HCL 5 MG/5ML PO SOLN: 5.0000 mg | ORAL | Status: DC | PRN

## 2014-02-21 NOTE — Patient Instructions (Signed)
Walk daily Keep up with the preop diet

## 2014-02-21 NOTE — Progress Notes (Signed)
Patient ID: Lori Montgomery, female   DOB: 1971-04-13, 43 y.o.   MRN: [id number]  Chief Complaint  Patient presents with  . Bariatric Pre-op    HPI Lori Montgomery is a 43 y.o. female.   HPI 43 year old female comes in today for a preoperative appointment. She is currently scheduled to undergo laparoscopic removal of adjustable gastric band with conversion to laparoscopic Roux-en-Y gastric bypass next Tuesday. I last saw her in the office on March 25. She denies any major changes. She did see a psychiatrist and had a new medication started with respect to her depression. She states she is now diagnosed with bipolar depression. 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 She also had a swollen gland underneath her right jaw earlier in the week and was placed on Augmentin. She denies any fever or chills. She denies any nasal or sinus drainage. She denies any difficulty swallowing. She states that it has resolved. Otherwise she denies any changes. She has started her preoperative diet. Her weight at her last visit was 267 pounds.  She currently denies any regurgitation, reflux, nighttime cough. Past Medical History  Diagnosis Date  . Depression   . Degenerative joint disease   . Gout   . Morbid obesity with BMI of 40.0-44.9, adult   . H/O hiatal hernia   . PONV (postoperative nausea and vomiting)     states also had localized reaction to IV meds during/following endoscopy 2011 requiring IV Benadryl- ? location of test    Past Surgical History  Procedure Laterality Date  . Breast reduction surgery  2008    with scar revision  . Laparoscopic cholecystectomy  03/2010    Dr Johney Maine  . Knee arthroscopy      right knee/ with ACL REPAIR  . Laparoscopic gastric banding N/A 10/04/2012    Procedure: LAPAROSCOPIC GASTRIC BANDING;  Surgeon: [name], MD,FACS;  Location: [facility];  Service: General;  Laterality: N/A;  Laparoscopic Adjustable Gastric Banding,   . Mesh applied to lap port  N/A 10/04/2012    Procedure: MESH APPLIED TO LAP PORT;  Surgeon: [name], MD,FACS;  Location: [facility];  Service: General;  Laterality: N/A;    Family History  Problem Relation Age of Onset  . Diabetes Mother   . Lung cancer Maternal Grandmother   . Breast cancer Maternal Aunt   . Diabetes Father   . Heart disease Paternal Grandmother   . Heart disease Paternal Grandfather     Social History History  Substance Use Topics  . Smoking status: Former Smoker    Quit date: 01/23/2011  . Smokeless tobacco: Never Used  . Alcohol Use: Yes     Comment: rarely    No Known Allergies  Current Outpatient Prescriptions  Medication Sig Dispense Refill  . QUEtiapine (SEROQUEL) 400 MG tablet Take 400 mg by mouth at bedtime.      Marland Kitchen venlafaxine (EFFEXOR-XR) 75 MG 24 hr capsule Take 75 mg by mouth every evening.       Marland Kitchen oxyCODONE (ROXICODONE) 5 MG/5ML solution Take 5-10 mLs (5-10 mg total) by mouth every 4 (four) hours as needed for severe pain.  200 mL  0   No current facility-administered medications for this visit.    Review of Systems Review of Systems  Constitutional: Negative for fever, activity change, appetite change and unexpected weight change.  HENT: Negative for nosebleeds and trouble swallowing.   Eyes: Negative for photophobia and visual disturbance.  Respiratory: Negative for chest tightness  and shortness of breath.   Cardiovascular: Negative for chest pain and leg swelling.       Denies CP, SOB, orthopnea, PND, DOE  Gastrointestinal: Negative for nausea, abdominal pain, diarrhea and constipation.       Denies reflux and regurgitation currently  Genitourinary: Negative for dysuria and difficulty urinating.  Musculoskeletal: Negative for arthralgias.  Skin: Negative for pallor and rash.  Neurological: Negative for dizziness, seizures, facial asymmetry and numbness.       Denies TIA and amaurosis fugax   Hematological: Negative for adenopathy. Does not bruise/bleed  easily.  Psychiatric/Behavioral: Negative for behavioral problems and agitation.       Has seen Dr Candis Schatz - Bipolar Depression    Blood pressure 126/82, pulse 108, temperature 97.1 F (36.2 C), height 5\' 7"  (1.702 m), weight 282 lb (127.914 kg).  Physical Exam Physical Exam  Vitals reviewed. Constitutional: She is oriented to person, place, and time. She appears well-developed and well-nourished. No distress.  HENT:  Head: Normocephalic and atraumatic.  Right Ear: External ear normal.  Left Ear: External ear normal.  Mouth/Throat: Oropharynx is clear and moist. No oropharyngeal exudate, posterior oropharyngeal edema or posterior oropharyngeal erythema.  Eyes: Conjunctivae are normal. No scleral icterus.  Neck: Normal range of motion. Neck supple. No tracheal deviation present. No thyromegaly present.  Cardiovascular: Normal rate and normal heart sounds.   Palpable port rt mid abd. Healed trocar sites  Pulmonary/Chest: Effort normal and breath sounds normal. No stridor. No respiratory distress. She has no wheezes.  Abdominal: Soft. She exhibits no distension. There is no tenderness. There is no rebound.  Musculoskeletal: She exhibits no edema and no tenderness.  Lymphadenopathy:    She has no cervical adenopathy.  Neurological: She is alert and oriented to person, place, and time. She exhibits normal muscle tone.  Skin: Skin is warm and dry. No rash noted. She is not diaphoretic. No erythema.  Psychiatric: She has a normal mood and affect. Her behavior is normal. Judgment and thought content normal.    Data Reviewed My office notes UGI- band in normal position, some delayed passage Cmet, cbc wnl Lipid panel- TC 228, LDL 147   Assessment    Morbid obesity BMI 44 Dislipidemia - no meds Bipolar Depression S/p Lap adjustable gastric banding 10/04/12 H/o GERD     Plan    I do not see any current sign of infection. There is no lymphadenopathy & her oropharynx cavity looks  normal so I think we can proceed with surgery next week.   I am a little disappointed with her weight gain since her last visit. We discussed the importance of the preoperative diet  We reviewed her preoperative workup. Although she still has some fluid in her band she is not having any symptoms of being too tight so I think I will leave the fluid in her band alone so it will help her with her preoperative diet  We discussed the importance of getting some exercise this week. She was given her prescription for her postoperative pain medication today.  I answered all her last questions about surgery. We discussed the typical hospital course with respect to revisional bariatric surgery. We also discussed the typical recovery course as well. She was encouraged to contact the office should she think of any additional questions between now and surgery  [name]. [name], MD, FACS General, Bariatric, & Minimally Invasive Surgery [facility], [place]        [facility] M 02/21/2014, 10:59 AM

## 2014-02-22 ENCOUNTER — Encounter (HOSPITAL_COMMUNITY): Payer: Self-pay

## 2014-02-22 ENCOUNTER — Encounter (HOSPITAL_COMMUNITY)
Admission: RE | Admit: 2014-02-22 | Discharge: 2014-02-22 | Disposition: A | Discharge status: Home / Self Care | Payer: BC Managed Care – PPO | Source: Ambulatory Visit | Attending: General Surgery | Admitting: General Surgery

## 2014-02-22 DIAGNOSIS — Z01812 Encounter for preprocedural laboratory examination: Secondary | ICD-10-CM | POA: Insufficient documentation

## 2014-02-22 HISTORY — DX: Granuloma annulare: L92.0

## 2014-02-22 LAB — COMPREHENSIVE METABOLIC PANEL
ALK PHOS: 74 U/L (ref 39–117)
ALT: 18 U/L (ref 0–35)
AST: 17 U/L (ref 0–37)
Albumin: 3.1 g/dL — ABNORMAL LOW (ref 3.5–5.2)
Anion gap: 12 (ref 5–15)
BUN: 13 mg/dL (ref 6–23)
CALCIUM: 9.3 mg/dL (ref 8.4–10.5)
CO2: 24 mEq/L (ref 19–32)
Chloride: 105 mEq/L (ref 96–112)
Creatinine, Ser: 0.75 mg/dL (ref 0.50–1.10)
GFR calc non Af Amer: >90 mL/min (ref >90)
GLUCOSE: 96 mg/dL (ref 70–99)
POTASSIUM: 4 meq/L (ref 3.7–5.3)
SODIUM: 141 meq/L (ref 137–147)
Total Bilirubin: <0.2 mg/dL — ABNORMAL LOW (ref 0.3–1.2)
Total Protein: 7 g/dL (ref 6.0–8.3)

## 2014-02-22 LAB — CBC WITH DIFFERENTIAL/PLATELET
BASOS ABS: 0 10*3/uL (ref 0.0–0.1)
Basophils Relative: 0 % (ref 0–1)
EOS PCT: 6 % — AB (ref 0–5)
Eosinophils Absolute: 0.3 10*3/uL (ref 0.0–0.7)
HCT: 39.2 % (ref 36.0–46.0)
Hemoglobin: 12.9 g/dL (ref 12.0–15.0)
LYMPHS ABS: 2 10*3/uL (ref 0.7–4.0)
LYMPHS PCT: 33 % (ref 12–46)
MCH: 30.4 pg (ref 26.0–34.0)
MCHC: 32.9 g/dL (ref 30.0–36.0)
MCV: 92.5 fL (ref 78.0–100.0)
Monocytes Absolute: 0.4 10*3/uL (ref 0.1–1.0)
Monocytes Relative: 6 % (ref 3–12)
NEUTROS PCT: 55 % (ref 43–77)
Neutro Abs: 3.2 10*3/uL (ref 1.7–7.7)
PLATELETS: 242 10*3/uL (ref 150–400)
RBC: 4.24 MIL/uL (ref 3.87–5.11)
RDW: 13.4 % (ref 11.5–15.5)
WBC: 5.9 10*3/uL (ref 4.0–10.5)

## 2014-02-22 LAB — HCG, SERUM, QUALITATIVE: Preg, Serum: NEGATIVE

## 2014-02-22 NOTE — Patient Instructions (Addendum)
Lori Montgomery  02/22/2014                           YOUR PROCEDURE IS SCHEDULED ON: 02/27/14 AT 7:15 AM               ENTER [place] ENTRANCE AND                            FOLLOW  SIGNS TO SHORT STAY CENTER                 ARRIVE AT SHORT STAY AT: 5:15 AM               CALL THIS NUMBER IF ANY PROBLEMS THE DAY OF SURGERY :               [phone]                                REMEMBER:   Do not eat food or drink liquids AFTER MIDNIGHT                  Take these medicines the morning of surgery with               A SIPS OF WATER :  NONE        Do not wear jewelry, make-up   Do not wear lotions, powders, or perfumes.   Do not shave legs or underarms 12 hrs. before surgery (men may shave face)  Do not bring valuables to the hospital.  Contacts, dentures or bridgework may not be worn into surgery.  Leave suitcase in the car. After surgery it may be brought to your room.  For patients admitted to the hospital more than one night, checkout time is            11:00 AM                                                     STOP ALL ASPIRIN AND HERBAL MEDS 7 DAYS PREOP  ________________________________________________________________________                                                                        [place]  Before surgery, you can play an important role.  Because skin is not sterile, your skin needs to be as free of germs as possible.  You can reduce the number of germs on your skin by washing with CHG (chlorahexidine gluconate) soap before surgery.  CHG is an antiseptic cleaner which kills germs and bonds with the skin to continue killing germs even after washing. Please DO NOT use if you have an allergy to CHG or antibacterial soaps.  If your skin becomes reddened/irritated stop using the CHG and inform your nurse when you arrive at Short Stay. Do not shave (including legs and underarms) for at least 48 hours  prior  to the first CHG shower.  You may shave your face. Please follow these instructions carefully:   1.  Shower with CHG Soap the night before surgery and the  morning of Surgery.   2.  If you choose to wash your hair, wash your hair first as usual with your  normal  Shampoo.   3.  After you shampoo, rinse your hair and body thoroughly to remove the  shampoo.                                         4.  Use CHG as you would any other liquid soap.  You can apply chg directly  to the skin and wash . Gently wash with scrungie or clean wascloth    5.  Apply the CHG Soap to your body ONLY FROM THE NECK DOWN.   Do not use on open                           Wound or open sores. Avoid contact with eyes, ears mouth and genitals (private parts).                        Genitals (private parts) with your normal soap.              6.  Wash thoroughly, paying special attention to the area where your surgery  will be performed.   7.  Thoroughly rinse your body with warm water from the neck down.   8.  DO NOT shower/wash with your normal soap after using and rinsing off  the CHG Soap .                9.  Pat yourself dry with a clean towel.             10.  Wear clean pajamas.             11.  Place clean sheets on your bed the night of your first shower and do not  sleep with pets.  Day of Surgery : Do not apply any lotions/deodorants the morning of surgery.  Please wear clean clothes to the hospital/surgery center.  FAILURE TO FOLLOW THESE INSTRUCTIONS MAY RESULT IN THE CANCELLATION OF YOUR SURGERY    PATIENT SIGNATURE_________________________________  ______________________________________________________________________

## 2014-02-27 ENCOUNTER — Encounter (HOSPITAL_COMMUNITY): Payer: BC Managed Care – PPO | Admitting: Anesthesiology

## 2014-02-27 ENCOUNTER — Encounter (HOSPITAL_COMMUNITY): Payer: Self-pay | Admitting: Certified Registered Nurse Anesthetist

## 2014-02-27 ENCOUNTER — Inpatient Hospital Stay (HOSPITAL_COMMUNITY)
Admission: RE | Admit: 2014-02-27 | Discharge: 2014-03-01 | DRG: 327 | Disposition: A | Discharge status: Home / Self Care | Payer: BC Managed Care – PPO | Source: Ambulatory Visit | Attending: General Surgery | Admitting: General Surgery

## 2014-02-27 ENCOUNTER — Encounter (HOSPITAL_COMMUNITY)
Admission: RE | Disposition: A | Discharge status: Home / Self Care | Payer: Self-pay | Source: Ambulatory Visit | Attending: General Surgery

## 2014-02-27 ENCOUNTER — Inpatient Hospital Stay (HOSPITAL_COMMUNITY): Payer: BC Managed Care – PPO | Admitting: Anesthesiology

## 2014-02-27 DIAGNOSIS — Z6841 Body Mass Index (BMI) 40.0 and over, adult: Secondary | ICD-10-CM

## 2014-02-27 DIAGNOSIS — Z87891 Personal history of nicotine dependence: Secondary | ICD-10-CM

## 2014-02-27 DIAGNOSIS — F3130 Bipolar disorder, current episode depressed, mild or moderate severity, unspecified: Secondary | ICD-10-CM | POA: Diagnosis present

## 2014-02-27 DIAGNOSIS — Z79899 Other long term (current) drug therapy: Secondary | ICD-10-CM

## 2014-02-27 DIAGNOSIS — Z803 Family history of malignant neoplasm of breast: Secondary | ICD-10-CM

## 2014-02-27 DIAGNOSIS — F319 Bipolar disorder, unspecified: Secondary | ICD-10-CM | POA: Diagnosis present

## 2014-02-27 DIAGNOSIS — K219 Gastro-esophageal reflux disease without esophagitis: Secondary | ICD-10-CM | POA: Diagnosis present

## 2014-02-27 DIAGNOSIS — Z833 Family history of diabetes mellitus: Secondary | ICD-10-CM

## 2014-02-27 DIAGNOSIS — Y831 Surgical operation with implant of artificial internal device as the cause of abnormal reaction of the patient, or of later complication, without mention of misadventure at the time of the procedure: Secondary | ICD-10-CM | POA: Diagnosis present

## 2014-02-27 DIAGNOSIS — K9509 Other complications of gastric band procedure: Principal | ICD-10-CM | POA: Diagnosis present

## 2014-02-27 DIAGNOSIS — Z9089 Acquired absence of other organs: Secondary | ICD-10-CM

## 2014-02-27 DIAGNOSIS — M1990 Unspecified osteoarthritis, unspecified site: Secondary | ICD-10-CM

## 2014-02-27 DIAGNOSIS — Z8249 Family history of ischemic heart disease and other diseases of the circulatory system: Secondary | ICD-10-CM

## 2014-02-27 DIAGNOSIS — Z9884 Bariatric surgery status: Secondary | ICD-10-CM

## 2014-02-27 DIAGNOSIS — Z801 Family history of malignant neoplasm of trachea, bronchus and lung: Secondary | ICD-10-CM

## 2014-02-27 DIAGNOSIS — E66813 Obesity, class 3: Secondary | ICD-10-CM | POA: Diagnosis present

## 2014-02-27 DIAGNOSIS — E785 Hyperlipidemia, unspecified: Secondary | ICD-10-CM | POA: Diagnosis present

## 2014-02-27 DIAGNOSIS — Z01812 Encounter for preprocedural laboratory examination: Secondary | ICD-10-CM

## 2014-02-27 DIAGNOSIS — K449 Diaphragmatic hernia without obstruction or gangrene: Secondary | ICD-10-CM | POA: Diagnosis present

## 2014-02-27 HISTORY — PX: GASTRIC ROUX-EN-Y: SHX5262

## 2014-02-27 LAB — HEMOGLOBIN AND HEMATOCRIT, BLOOD
HCT: 36.8 % (ref 36.0–46.0)
HEMOGLOBIN: 12 g/dL (ref 12.0–15.0)

## 2014-02-27 SURGERY — LAPAROSCOPIC ROUX-EN-Y GASTRIC BYPASS WITH UPPER ENDOSCOPY
Anesthesia: General

## 2014-02-27 MED ORDER — TISSEEL VH 10 ML EX KIT
PACK | CUTANEOUS | Status: AC
  Filled 2014-02-27: qty 2

## 2014-02-27 MED ORDER — CISATRACURIUM BESYLATE (PF) 10 MG/5ML IV SOLN
INTRAVENOUS | Status: DC | PRN
  Administered 2014-02-27 (×2): 4 mg via INTRAVENOUS
  Administered 2014-02-27: 2 mg via INTRAVENOUS
  Administered 2014-02-27: 10 mg via INTRAVENOUS
  Administered 2014-02-27: 4 mg via INTRAVENOUS

## 2014-02-27 MED ORDER — EVICEL 5 ML EX KIT
PACK | CUTANEOUS | Status: DC | PRN
  Administered 2014-02-27: 2

## 2014-02-27 MED ORDER — PROMETHAZINE HCL 25 MG/ML IJ SOLN
6.2500 mg | INTRAMUSCULAR | Status: DC | PRN
  Administered 2014-02-27: 6.25 mg via INTRAVENOUS

## 2014-02-27 MED ORDER — CHLORHEXIDINE GLUCONATE 4 % EX LIQD: 60.0000 mL | Freq: Once | CUTANEOUS | Status: DC

## 2014-02-27 MED ORDER — FENTANYL CITRATE 0.05 MG/ML IJ SOLN
INTRAMUSCULAR | Status: AC
  Filled 2014-02-27: qty 2

## 2014-02-27 MED ORDER — STERILE WATER FOR IRRIGATION IR SOLN
Status: DC | PRN
  Administered 2014-02-27: 1500 mL

## 2014-02-27 MED ORDER — ACETAMINOPHEN 160 MG/5ML PO SOLN
650.0000 mg | ORAL | Status: DC | PRN
  Administered 2014-02-28 (×2): 650 mg via ORAL
  Filled 2014-02-27: qty 20.3

## 2014-02-27 MED ORDER — ENOXAPARIN SODIUM 40 MG/0.4ML ~~LOC~~ SOLN
40.0000 mg | Freq: Two times a day (BID) | SUBCUTANEOUS | Status: DC
  Administered 2014-02-28 – 2014-03-01 (×3): 40 mg via SUBCUTANEOUS
  Filled 2014-02-27 (×5): qty 0.4

## 2014-02-27 MED ORDER — HYDROMORPHONE HCL PF 1 MG/ML IJ SOLN
INTRAMUSCULAR | Status: AC
  Filled 2014-02-27: qty 1

## 2014-02-27 MED ORDER — HYDROMORPHONE HCL PF 1 MG/ML IJ SOLN
INTRAMUSCULAR | Status: DC | PRN
  Administered 2014-02-27 (×2): 1 mg via INTRAVENOUS

## 2014-02-27 MED ORDER — MIDAZOLAM HCL 2 MG/2ML IJ SOLN
INTRAMUSCULAR | Status: AC
  Filled 2014-02-27: qty 2

## 2014-02-27 MED ORDER — CHLORHEXIDINE GLUCONATE 0.12 % MT SOLN
15.0000 mL | Freq: Two times a day (BID) | OROMUCOSAL | Status: DC
  Administered 2014-02-27 – 2014-03-01 (×4): 15 mL via OROMUCOSAL
  Filled 2014-02-27 (×6): qty 15

## 2014-02-27 MED ORDER — KCL IN DEXTROSE-NACL 20-5-0.45 MEQ/L-%-% IV SOLN
INTRAVENOUS | Status: DC
  Administered 2014-02-27 (×2): via INTRAVENOUS
  Filled 2014-02-27 (×7): qty 1000

## 2014-02-27 MED ORDER — NEOSTIGMINE METHYLSULFATE 10 MG/10ML IV SOLN
INTRAVENOUS | Status: AC
  Filled 2014-02-27: qty 1

## 2014-02-27 MED ORDER — ONDANSETRON HCL 4 MG/2ML IJ SOLN
INTRAMUSCULAR | Status: AC
  Filled 2014-02-27: qty 2

## 2014-02-27 MED ORDER — ACETAMINOPHEN 10 MG/ML IV SOLN
1000.0000 mg | Freq: Four times a day (QID) | INTRAVENOUS | Status: AC
  Administered 2014-02-27 – 2014-02-28 (×4): 1000 mg via INTRAVENOUS
  Filled 2014-02-27 (×6): qty 100

## 2014-02-27 MED ORDER — SUCCINYLCHOLINE CHLORIDE 20 MG/ML IJ SOLN
INTRAMUSCULAR | Status: DC | PRN
  Administered 2014-02-27: 100 mg via INTRAVENOUS

## 2014-02-27 MED ORDER — BIOTENE DRY MOUTH MT LIQD
15.0000 mL | Freq: Two times a day (BID) | OROMUCOSAL | Status: DC
  Administered 2014-02-27 – 2014-03-01 (×4): 15 mL via OROMUCOSAL

## 2014-02-27 MED ORDER — CEFOXITIN SODIUM 2 G IV SOLR
2.0000 g | INTRAVENOUS | Status: AC
  Administered 2014-02-27: 2 g via INTRAVENOUS

## 2014-02-27 MED ORDER — LACTATED RINGERS IV SOLN: INTRAVENOUS | Status: DC

## 2014-02-27 MED ORDER — UNJURY CHOCOLATE CLASSIC POWDER: 2.0000 [oz_av] | Freq: Four times a day (QID) | ORAL | Status: DC

## 2014-02-27 MED ORDER — ONDANSETRON HCL 4 MG/2ML IJ SOLN
4.0000 mg | INTRAMUSCULAR | Status: DC | PRN
  Administered 2014-02-27 – 2014-02-28 (×2): 4 mg via INTRAVENOUS
  Filled 2014-02-27 (×2): qty 2

## 2014-02-27 MED ORDER — LABETALOL HCL 5 MG/ML IV SOLN
INTRAVENOUS | Status: DC | PRN
  Administered 2014-02-27 (×4): 2.5 mg via INTRAVENOUS

## 2014-02-27 MED ORDER — 0.9 % SODIUM CHLORIDE (POUR BTL) OPTIME
TOPICAL | Status: DC | PRN
  Administered 2014-02-27: 1000 mL

## 2014-02-27 MED ORDER — HYDROMORPHONE HCL PF 1 MG/ML IJ SOLN
0.2500 mg | INTRAMUSCULAR | Status: DC | PRN
  Administered 2014-02-27 (×2): 0.5 mg via INTRAVENOUS

## 2014-02-27 MED ORDER — PROPOFOL 10 MG/ML IV BOLUS
INTRAVENOUS | Status: DC | PRN
  Administered 2014-02-27: 160 mg via INTRAVENOUS

## 2014-02-27 MED ORDER — NEOSTIGMINE METHYLSULFATE 10 MG/10ML IV SOLN
INTRAVENOUS | Status: DC | PRN
  Administered 2014-02-27: 4 mg via INTRAVENOUS

## 2014-02-27 MED ORDER — HEPARIN SODIUM (PORCINE) 5000 UNIT/ML IJ SOLN
5000.0000 [IU] | INTRAMUSCULAR | Status: AC
  Administered 2014-02-27: 5000 [IU] via SUBCUTANEOUS
  Filled 2014-02-27: qty 1

## 2014-02-27 MED ORDER — LACTATED RINGERS IV SOLN
INTRAVENOUS | Status: DC | PRN
  Administered 2014-02-27 (×4): via INTRAVENOUS

## 2014-02-27 MED ORDER — UNJURY VANILLA POWDER: 2.0000 [oz_av] | Freq: Four times a day (QID) | ORAL | Status: DC

## 2014-02-27 MED ORDER — BUPIVACAINE-EPINEPHRINE 0.25% -1:200000 IJ SOLN
INTRAMUSCULAR | Status: DC | PRN
  Administered 2014-02-27: 40 mL

## 2014-02-27 MED ORDER — MORPHINE SULFATE 2 MG/ML IJ SOLN
2.0000 mg | INTRAMUSCULAR | Status: DC | PRN
  Administered 2014-02-27 (×2): 4 mg via INTRAVENOUS
  Administered 2014-02-27: 2 mg via INTRAVENOUS
  Administered 2014-02-27 – 2014-02-28 (×7): 4 mg via INTRAVENOUS
  Filled 2014-02-27 (×2): qty 2
  Filled 2014-02-27: qty 1
  Filled 2014-02-27 (×7): qty 2

## 2014-02-27 MED ORDER — FENTANYL CITRATE 0.05 MG/ML IJ SOLN
INTRAMUSCULAR | Status: DC | PRN
  Administered 2014-02-27: 150 ug via INTRAVENOUS
  Administered 2014-02-27: 100 ug via INTRAVENOUS
  Administered 2014-02-27 (×2): 50 ug via INTRAVENOUS

## 2014-02-27 MED ORDER — PROPOFOL 10 MG/ML IV BOLUS
INTRAVENOUS | Status: AC
  Filled 2014-02-27: qty 20

## 2014-02-27 MED ORDER — MIDAZOLAM HCL 5 MG/5ML IJ SOLN
INTRAMUSCULAR | Status: DC | PRN
  Administered 2014-02-27: 2 mg via INTRAVENOUS

## 2014-02-27 MED ORDER — CEFOXITIN SODIUM 2 G IV SOLR
2.0000 g | Freq: Once | INTRAVENOUS | Status: AC
  Administered 2014-02-27: 2 g via INTRAVENOUS
  Filled 2014-02-27: qty 2

## 2014-02-27 MED ORDER — OXYCODONE HCL 5 MG/5ML PO SOLN
5.0000 mg | ORAL | Status: DC | PRN
  Administered 2014-02-28 (×2): 10 mg via ORAL
  Administered 2014-02-28 – 2014-03-01 (×4): 5 mg via ORAL
  Filled 2014-02-27 (×3): qty 5
  Filled 2014-02-27 (×2): qty 10
  Filled 2014-02-27: qty 5

## 2014-02-27 MED ORDER — LIDOCAINE HCL (CARDIAC) 20 MG/ML IV SOLN
INTRAVENOUS | Status: AC
  Filled 2014-02-27: qty 5

## 2014-02-27 MED ORDER — ACETAMINOPHEN 160 MG/5ML PO SOLN
325.0000 mg | ORAL | Status: DC | PRN
  Administered 2014-02-28: 650 mg via ORAL
  Filled 2014-02-27 (×2): qty 20.3

## 2014-02-27 MED ORDER — LABETALOL HCL 5 MG/ML IV SOLN
INTRAVENOUS | Status: AC
  Filled 2014-02-27: qty 4

## 2014-02-27 MED ORDER — LACTATED RINGERS IR SOLN
Status: DC | PRN
  Administered 2014-02-27: 3000 mL

## 2014-02-27 MED ORDER — DEXTROSE 5 % IV SOLN
INTRAVENOUS | Status: AC
  Filled 2014-02-27: qty 2

## 2014-02-27 MED ORDER — BUPIVACAINE-EPINEPHRINE 0.25% -1:200000 IJ SOLN
INTRAMUSCULAR | Status: AC
  Filled 2014-02-27: qty 1

## 2014-02-27 MED ORDER — PANTOPRAZOLE SODIUM 40 MG IV SOLR
40.0000 mg | INTRAVENOUS | Status: DC
  Administered 2014-02-27 – 2014-02-28 (×2): 40 mg via INTRAVENOUS
  Filled 2014-02-27 (×3): qty 40

## 2014-02-27 MED ORDER — UNJURY CHICKEN SOUP POWDER
2.0000 [oz_av] | Freq: Four times a day (QID) | ORAL | Status: DC
  Administered 2014-03-01: 2 [oz_av] via ORAL

## 2014-02-27 MED ORDER — GLYCOPYRROLATE 0.2 MG/ML IJ SOLN
INTRAMUSCULAR | Status: AC
  Filled 2014-02-27: qty 2

## 2014-02-27 MED ORDER — LIDOCAINE HCL (CARDIAC) 20 MG/ML IV SOLN
INTRAVENOUS | Status: DC | PRN
  Administered 2014-02-27: 80 mg via INTRAVENOUS

## 2014-02-27 MED ORDER — PROMETHAZINE HCL 25 MG/ML IJ SOLN
INTRAMUSCULAR | Status: AC
  Filled 2014-02-27: qty 1

## 2014-02-27 MED ORDER — ONDANSETRON HCL 4 MG/2ML IJ SOLN
INTRAMUSCULAR | Status: DC | PRN
  Administered 2014-02-27: 4 mg via INTRAVENOUS

## 2014-02-27 MED ORDER — HYDROMORPHONE HCL PF 2 MG/ML IJ SOLN
INTRAMUSCULAR | Status: AC
  Filled 2014-02-27: qty 1

## 2014-02-27 MED ORDER — GLYCOPYRROLATE 0.2 MG/ML IJ SOLN
INTRAMUSCULAR | Status: DC | PRN
  Administered 2014-02-27: 0.4 mg via INTRAVENOUS

## 2014-02-27 MED ORDER — FENTANYL CITRATE 0.05 MG/ML IJ SOLN
INTRAMUSCULAR | Status: AC
  Filled 2014-02-27: qty 5

## 2014-02-27 SURGICAL SUPPLY — 67 items
APPLICATOR COTTON TIP 6IN STRL (MISCELLANEOUS) IMPLANT
APPLIER CLIP ROT 13.4 12 LRG (CLIP) ×2
BLADE SURG SZ11 CARB STEEL (BLADE) ×2 IMPLANT
CABLE HIGH FREQUENCY MONO STRZ (ELECTRODE) ×2 IMPLANT
CHLORAPREP W/TINT 26ML (MISCELLANEOUS) ×4 IMPLANT
CLIP APPLIE ROT 13.4 12 LRG (CLIP) ×1 IMPLANT
CLIP SUT LAPRA TY ABSORB (SUTURE) IMPLANT
DERMABOND ADVANCED (GAUZE/BANDAGES/DRESSINGS) ×1
DERMABOND ADVANCED .7 DNX12 (GAUZE/BANDAGES/DRESSINGS) ×1 IMPLANT
DEVICE SUTURE ENDOST 10MM (ENDOMECHANICALS) IMPLANT
DISSECTOR BLUNT TIP ENDO 5MM (MISCELLANEOUS) ×2 IMPLANT
DRAIN PENROSE 18X1/4 LTX STRL (WOUND CARE) ×2 IMPLANT
DRAPE CAMERA CLOSED 9X96 (DRAPES) ×2 IMPLANT
ELECT CAUTERY BLADE 6.4 (BLADE) ×2 IMPLANT
ELECT REM PT RETURN 9FT ADLT (ELECTROSURGICAL) ×2
ELECTRODE REM PT RTRN 9FT ADLT (ELECTROSURGICAL) ×1 IMPLANT
FLOSHIELD STORZ OLYMP 10MM 45 (MISCELLANEOUS) ×2 IMPLANT
GAUZE SPONGE 4X4 12PLY STRL (GAUZE/BANDAGES/DRESSINGS) IMPLANT
GAUZE SPONGE 4X4 16PLY XRAY LF (GAUZE/BANDAGES/DRESSINGS) ×2 IMPLANT
GLOVE BIOGEL M STRL SZ7.5 (GLOVE) IMPLANT
GOWN STRL REUS W/TWL XL LVL3 (GOWN DISPOSABLE) ×8 IMPLANT
HOVERMATT SINGLE USE (MISCELLANEOUS) ×2 IMPLANT
KIT BASIN OR (CUSTOM PROCEDURE TRAY) ×2 IMPLANT
KIT GASTRIC LAVAGE 34FR ADT (SET/KITS/TRAYS/PACK) ×2 IMPLANT
NEEDLE SPNL 22GX3.5 QUINCKE BK (NEEDLE) ×2 IMPLANT
PACK CARDIOVASCULAR III (CUSTOM PROCEDURE TRAY) ×2 IMPLANT
PEN SKIN MARKING BROAD (MISCELLANEOUS) ×2 IMPLANT
PENCIL BUTTON HOLSTER BLD 10FT (ELECTRODE) ×2 IMPLANT
RELOAD 45 VASCULAR/THIN (ENDOMECHANICALS) ×2 IMPLANT
RELOAD ENDO STITCH (ENDOMECHANICALS) ×2 IMPLANT
RELOAD ENDO STITCH 2.0 (ENDOMECHANICALS) ×8
RELOAD STAPLE TA45 3.5 REG BLU (ENDOMECHANICALS) ×4 IMPLANT
RELOAD STAPLER BLUE 60MM (STAPLE) ×1 IMPLANT
RELOAD STAPLER GOLD 60MM (STAPLE) ×3 IMPLANT
RELOAD STAPLER WHITE 60MM (STAPLE) ×1 IMPLANT
SCISSORS LAP 5X35 DISP (ENDOMECHANICALS) ×2 IMPLANT
SCISSORS LAP 5X45 EPIX DISP (ENDOMECHANICALS) ×2 IMPLANT
SEALANT SURGICAL APPL DUAL CAN (MISCELLANEOUS) ×2 IMPLANT
SET IRRIG TUBING LAPAROSCOPIC (IRRIGATION / IRRIGATOR) ×2 IMPLANT
SHEARS HARMONIC ACE PLUS 45CM (MISCELLANEOUS) ×2 IMPLANT
SLEEVE ADV FIXATION 12X100MM (TROCAR) ×4 IMPLANT
SLEEVE XCEL OPT CAN 5 100 (ENDOMECHANICALS) ×2 IMPLANT
SOLUTION ANTI FOG 6CC (MISCELLANEOUS) ×2 IMPLANT
STAPLE ECHEON FLEX 60 POW ENDO (STAPLE) IMPLANT
STAPLER RELOAD BLUE 60MM (STAPLE) ×2
STAPLER RELOAD GOLD 60MM (STAPLE) ×6
STAPLER RELOAD WHITE 60MM (STAPLE) ×2
STAPLER VISISTAT 35W (STAPLE) IMPLANT
SUT DVC SILK 2.0X39 (SUTURE) ×4 IMPLANT
SUT MNCRL AB 4-0 PS2 18 (SUTURE) ×6 IMPLANT
SUT RELOAD ENDO STITCH 2 48X1 (ENDOMECHANICALS) ×6
SUT RELOAD ENDO STITCH 2.0 (ENDOMECHANICALS) ×2
SUT VIC AB 2-0 SH 27 (SUTURE) ×1
SUT VIC AB 2-0 SH 27X BRD (SUTURE) ×1 IMPLANT
SUT VICRYL 0 TIES 12 18 (SUTURE) ×2 IMPLANT
SUTURE RELOAD END STTCH 2 48X1 (ENDOMECHANICALS) ×6 IMPLANT
SUTURE RELOAD ENDO STITCH 2.0 (ENDOMECHANICALS) ×2 IMPLANT
SYRINGE 20CC LL (MISCELLANEOUS) ×4 IMPLANT
TOWEL OR 17X26 10 PK STRL BLUE (TOWEL DISPOSABLE) ×2 IMPLANT
TOWEL OR NON WOVEN STRL DISP B (DISPOSABLE) ×2 IMPLANT
TRAY FOLEY CATH 14FRSI W/METER (CATHETERS) ×2 IMPLANT
TROCAR BLADELESS 15MM (ENDOMECHANICALS) ×2 IMPLANT
TROCAR BLADELESS OPT 5 100 (ENDOMECHANICALS) ×2 IMPLANT
TROCAR UNIVERSAL OPT 12M 100M (ENDOMECHANICALS) ×6 IMPLANT
TROCAR XCEL 12X100 BLDLESS (ENDOMECHANICALS) ×2 IMPLANT
TUBING ENDO SMARTCAP PENTAX (MISCELLANEOUS) ×2 IMPLANT
TUBING FILTER THERMOFLATOR (ELECTROSURGICAL) ×2 IMPLANT

## 2014-02-27 NOTE — Op Note (Signed)
Dylin Ihnen [id number] 1971-05-31 02/27/2014  Preoperative diagnosis: failed lapband; for removal of lapband and conversion to roux Y gastric bypass  Postoperative diagnosis: Same   Procedure: Upper endoscopy   Surgeon: Catalina Antigua B. Hassell Done  M.D., FACS   Anesthesia: Gen.   Indications for procedure: 43 yo WF undergoing a laparoscopic roux en y gastric bypass and an upper endoscopy was requested to evaluate the anastomosis.  Description of procedure: After we have completed the new gastrojejunostomy, I scrubbed out and obtained the Olympus endoscope. I gently placed endoscope in the patient's oropharynx and gently glided it down the esophagus without any difficulty under direct visualization. Once I was in the gastric pouch, I insufflated the pouch was air. The pouch was approximately 5 cm in size. I was able to cannulate and advanced the scope through the gastrojejunostomy. Dr.Wilson had placed saline in the upper abdomen. Upon further insufflation of the gastric pouch there was no evidence of bubbles. Upon further inspection of the gastric pouch, the mucosa appeared normal. There is no evidence of any mucosal abnormality. The gastric pouch and Roux limb were decompressed. The width of the gastrojejunal anastomosis was at least 1.5 cm. The scope was withdrawn. The patient tolerated this portion of the procedure well. Please see Dr Dois Davenport  operative note for details regarding the laparoscopic roux-en-y gastric bypass.  Matt B. Hassell Done, MD, FACS General, Bariatric, & Minimally Invasive Surgery [facility], [place]

## 2014-02-27 NOTE — Transfer of Care (Signed)
Immediate Anesthesia Transfer of Care Note  Patient: Lori Montgomery  Procedure(s) Performed: Procedure(s):  LAPAROSCOPIC REMOVAL OF LAP BAND AND PORT,LAPAROSCOPIC ROUX-EN-Y GASTRIC BYPASS WITH UPPER ENDOSCOPY,  (N/A)  Patient Location: PACU  Anesthesia Type:General  Level of Consciousness: awake, alert  and oriented  Airway & Oxygen Therapy: Patient Spontanous Breathing and Patient connected to nasal cannula oxygen  Post-op Assessment: Report given to PACU RN  Post vital signs: Reviewed and stable  Complications: No apparent anesthesia complications

## 2014-02-27 NOTE — H&P (View-Only) (Signed)
Patient ID: Lori Montgomery, female   DOB: 1971-04-14, 43 y.o.   MRN: [id number]  Chief Complaint  Patient presents with  . Bariatric Pre-op    HPI Lori Montgomery is a 43 y.o. female.   HPI 43 year old female comes in today for a preoperative appointment. She is currently scheduled to undergo laparoscopic removal of adjustable gastric band with conversion to laparoscopic Roux-en-Y gastric bypass next Tuesday. I last saw her in the office on March 25. She denies any major changes. She did see a psychiatrist and had a new medication started with respect to her depression. She states she is now diagnosed with bipolar depression. 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 She also had a swollen gland underneath her right jaw earlier in the week and was placed on Augmentin. She denies any fever or chills. She denies any nasal or sinus drainage. She denies any difficulty swallowing. She states that it has resolved. Otherwise she denies any changes. She has started her preoperative diet. Her weight at her last visit was 267 pounds.  She currently denies any regurgitation, reflux, nighttime cough. Past Medical History  Diagnosis Date  . Depression   . Degenerative joint disease   . Gout   . Morbid obesity with BMI of 40.0-44.9, adult   . H/O hiatal hernia   . PONV (postoperative nausea and vomiting)     states also had localized reaction to IV meds during/following endoscopy 2011 requiring IV Benadryl- ? location of test    Past Surgical History  Procedure Laterality Date  . Breast reduction surgery  2008    with scar revision  . Laparoscopic cholecystectomy  03/2010    Dr Johney Maine  . Knee arthroscopy      right knee/ with ACL REPAIR  . Laparoscopic gastric banding N/A 10/04/2012    Procedure: LAPAROSCOPIC GASTRIC BANDING;  Surgeon: [name], MD,FACS;  Location: [facility];  Service: General;  Laterality: N/A;  Laparoscopic Adjustable Gastric Banding,   . Mesh applied to lap port  N/A 10/04/2012    Procedure: MESH APPLIED TO LAP PORT;  Surgeon: [name], MD,FACS;  Location: [facility];  Service: General;  Laterality: N/A;    Family History  Problem Relation Age of Onset  . Diabetes Mother   . Lung cancer Maternal Grandmother   . Breast cancer Maternal Aunt   . Diabetes Father   . Heart disease Paternal Grandmother   . Heart disease Paternal Grandfather     Social History History  Substance Use Topics  . Smoking status: Former Smoker    Quit date: 01/23/2011  . Smokeless tobacco: Never Used  . Alcohol Use: Yes     Comment: rarely    No Known Allergies  Current Outpatient Prescriptions  Medication Sig Dispense Refill  . QUEtiapine (SEROQUEL) 400 MG tablet Take 400 mg by mouth at bedtime.      Marland Kitchen venlafaxine (EFFEXOR-XR) 75 MG 24 hr capsule Take 75 mg by mouth every evening.       Marland Kitchen oxyCODONE (ROXICODONE) 5 MG/5ML solution Take 5-10 mLs (5-10 mg total) by mouth every 4 (four) hours as needed for severe pain.  200 mL  0   No current facility-administered medications for this visit.    Review of Systems Review of Systems  Constitutional: Negative for fever, activity change, appetite change and unexpected weight change.  HENT: Negative for nosebleeds and trouble swallowing.   Eyes: Negative for photophobia and visual disturbance.  Respiratory: Negative for chest tightness  and shortness of breath.   Cardiovascular: Negative for chest pain and leg swelling.       Denies CP, SOB, orthopnea, PND, DOE  Gastrointestinal: Negative for nausea, abdominal pain, diarrhea and constipation.       Denies reflux and regurgitation currently  Genitourinary: Negative for dysuria and difficulty urinating.  Musculoskeletal: Negative for arthralgias.  Skin: Negative for pallor and rash.  Neurological: Negative for dizziness, seizures, facial asymmetry and numbness.       Denies TIA and amaurosis fugax   Hematological: Negative for adenopathy. Does not bruise/bleed  easily.  Psychiatric/Behavioral: Negative for behavioral problems and agitation.       Has seen Dr Candis Schatz - Bipolar Depression    Blood pressure 126/82, pulse 108, temperature 97.1 F (36.2 C), height 5\' 7"  (1.702 m), weight 282 lb (127.914 kg).  Physical Exam Physical Exam  Vitals reviewed. Constitutional: She is oriented to person, place, and time. She appears well-developed and well-nourished. No distress.  HENT:  Head: Normocephalic and atraumatic.  Right Ear: External ear normal.  Left Ear: External ear normal.  Mouth/Throat: Oropharynx is clear and moist. No oropharyngeal exudate, posterior oropharyngeal edema or posterior oropharyngeal erythema.  Eyes: Conjunctivae are normal. No scleral icterus.  Neck: Normal range of motion. Neck supple. No tracheal deviation present. No thyromegaly present.  Cardiovascular: Normal rate and normal heart sounds.   Palpable port rt mid abd. Healed trocar sites  Pulmonary/Chest: Effort normal and breath sounds normal. No stridor. No respiratory distress. She has no wheezes.  Abdominal: Soft. She exhibits no distension. There is no tenderness. There is no rebound.  Musculoskeletal: She exhibits no edema and no tenderness.  Lymphadenopathy:    She has no cervical adenopathy.  Neurological: She is alert and oriented to person, place, and time. She exhibits normal muscle tone.  Skin: Skin is warm and dry. No rash noted. She is not diaphoretic. No erythema.  Psychiatric: She has a normal mood and affect. Her behavior is normal. Judgment and thought content normal.    Data Reviewed My office notes UGI- band in normal position, some delayed passage Cmet, cbc wnl Lipid panel- TC 228, LDL 147   Assessment    Morbid obesity BMI 44 Dislipidemia - no meds Bipolar Depression S/p Lap adjustable gastric banding 10/04/12 H/o GERD     Plan    I do not see any current sign of infection. There is no lymphadenopathy & her oropharynx cavity looks  normal so I think we can proceed with surgery next week.   I am a little disappointed with her weight gain since her last visit. We discussed the importance of the preoperative diet  We reviewed her preoperative workup. Although she still has some fluid in her band she is not having any symptoms of being too tight so I think I will leave the fluid in her band alone so it will help her with her preoperative diet  We discussed the importance of getting some exercise this week. She was given her prescription for her postoperative pain medication today.  I answered all her last questions about surgery. We discussed the typical hospital course with respect to revisional bariatric surgery. We also discussed the typical recovery course as well. She was encouraged to contact the office should she think of any additional questions between now and surgery  [name]. [name], MD, FACS General, Bariatric, & Minimally Invasive Surgery [facility], [place]        [facility] M 02/21/2014, 10:59 AM

## 2014-02-27 NOTE — Op Note (Signed)
NAME:  Lori Montgomery, Lori Montgomery NO.:  [id number]  MEDICAL RECORD NO.:  [id number]  LOCATION:  [facility]                         FACILITY:  [facility]  PHYSICIAN:  [name]. [name], MD, FACSDATE OF BIRTH:  [birth year]  DATE OF PROCEDURE:  02/27/2014 DATE OF DISCHARGE:                              OPERATIVE REPORT   PREOPERATIVE DIAGNOSES: 1. Morbid obesity, body mass index 54. 2. Dyslipidemia. 3. Bipolar depression. 4. Status post laparoscopic adjustable gastric banding with failure and unsatisfactory weight loss 5. History of gastroesophageal reflux disease (current no     medications).  POSTOPERATIVE DIAGNOSES: 1. Morbid obesity, body mass index 54. 2. Dyslipidemia. 3. Bipolar depression. 4. Status post laparoscopic adjustable gastric banding with failure and unsatisfactory weight loss 5. History of gastroesophageal reflux disease (current no     medications).  PROCEDURE: 1. Removal of laparoscopic adjustable gastric band and port. 2. Laparoscopic Roux-en-Y gastric bypass with upper endoscopy.  SURGEON:  [name]. [name], MD, FACS  ASSISTANT SURGEON:  Isabel Caprice. Hassell Done, MD FACS  ANESTHESIA:  General.  ESTIMATED BLOOD LOSS:  Less than 100.  INDICATIONS FOR PROCEDURE:  The patient is a pleasant 43 year old, obese Caucasian female, who underwent laparoscopic adjustable gastric banding 15 months ago.  She did have some weight loss with her lap band, but despite being in the green zone for more than 8 months and making good food choices, the patient was not satisfied nor had satisfactory weight loss.  She desired conversion to a Roux-en-Y gastric bypass.  We discussed the risks and benefits of surgery including, but not limited to, bleeding, infection, injury to surrounding structures, blood clot formation, anastomotic stricture, anastomotic leak, marginal ulcer, hernia, wound infection, dehydration, need for readmission, need for reoperative surgery, as well as potential  failure to achieve ideal weight even with this procedure.  We discussed the typical postoperative course.  I explained since this was a revisional surgery, that her risk for bleeding, infection, leak, and injury to surrounding structures was higher.  She elected to proceed with surgery.  DESCRIPTION OF PROCEDURE:  After obtaining informed consent, the patient was taken to the operating room 1 at [facility].  She received 5000 units of subcutaneous heparin in the short-stay area.  She was placed supine on the operating table.  General endotracheal anesthesia was established.  A Foley catheter was placed.  Her abdomen was prepped and draped in usual standard surgical fashion.  Using her old scar in the left upper quadrant, I infiltrated local.  A small incision was made through her old left upper quadrant incision which was 2 fingerbreadths below the left costal margin with #11 blade. A 12-mm trocar with a 0- degree laparoscope was advanced through all layers of abdominal wall. The abdominal cavity was entered.  Pneumoperitoneum was smoothly established up to a patient pressure of 15 mmHg.  A laparoscope was advanced and there was no evidence of injury to surrounding structures. There were really no adhesions.  The lap band tubing was visualized.  I went about placing her in reverse Trendelenburg at that point.  An 11 mm trocar was placed just to the left of the umbilicus through her old trocar site  under direct visualization.  A 12-mm trocar was placed in the lateral right abdomen through an old trocar site.  Liver retractor was placed in the epigastric position to retract the left lobe of the liver.  I was unable to use the mid right abdomen trocar site where her port was located because it was too high, so a new incision was made just below that in the right midabdomen and a 12-mm trocar was placed under direct visualization and finally lateral left-sided 5 mm trocar was placed  under visualization. The lap band was visualized around the upper stomach.  There was the typical cicatrix around the lap band.  I was able to incise the cicatrix and the fibrous capsule on top of the band with hook electrocautery and freed up the buckle of the band.  There were few adhesions to the left lobe of the liver, which was taken down with hook electrocautery.  I then started taking down the capsule toward the left side of the abdomen, staying on top of the capsule and the band.  I ended up trimming and cutting the tubing in order to drain some of the fluid out of the band.  This allowed me to have greater traction on the band.  The previous imbrication sutures were encountered and they were taken down.  There was no evidence of enterotomy into the stomach.  At this point, I was able to remove the lap band from around the stomach and the 12-mm trocar in the right mid abdomen was up sized to a 15 mm trocar and the band itself was removed from the abdomen.  The base of the capsule around the stomach was incised with a scissor allowing some release of the upper stomach.  We inspected the upper stomach as well as the lower stomach and there was no evidence of injury to the gastric wall.  At this point, I measured about 6 cm from the GE junction, and opened up a space along the lesser curve, initially with the Harmonic Scalpel entering the lesser sac.  An Echelon 60 mm stapler with a gold load was then fired.  I then angulated the pouch up toward the left crus.  The gastric pouch was then further mobilized posteriorly and the pouch was completed with 2 additional firings of a 60 mm gold load and one final firing of a 45 mm linear stapler up through the previously dissected angle of His.  The pouch was of adequate length, but it was a little bit wider than on initial gastric bypass surgery would be.  At this point, the liver retractor was released and we started about creating the  jejunojejunostomy.  The omentum was brought up into the upper abdomen and the transverse colon and the mesocolon elevated and the ligament of Treitz was clearly identified.  A 40 cm biliary pancreatic limb was then carefully measured from the ligament of Treitz.  The small intestine was divided at this point with a single firing of a white load linear stapler.  A Penrose drain was then sutured to the end of the Roux limb for later identification.  A 100 cm Roux limb was then carefully measured.  At this point, a side-to-side anastomosis was created between the Roux limb and the end of the biliary pancreatic limb.  This was accomplished with a single firing of the 45 mm white load linear stapler.  The common enterotomy was then closed with a running 2-0 Vicryl begun at either end of  the enterotomy and tied centrally.  Tisseel tissue sealant was then placed over the anastomosis.  The mesenteric defect was then closed with a running 2-0 silk using the Endo 360 suture device.  The omentum was then divided with the Harmonic Scalpel towards the transverse colon to allow mobility of the Roux limb towards the gastric pouch.  The patient was then placed back in steep reverse Trendelenburg.  The Roux limb was then brought up in an antecolic fashion with the candy-cane facing to the patient's left without undue tension.  The gastrojejunostomy was then created with initial posterior row of 2-0 Vicryl suture between the Roux limb and the staple line of the gastric pouch.  Enterotomies were then made in the gastric pouch and the Roux limb with Harmonic Scalpel, and an approximately 2.5 cm anastomosis was created with a single firing of the 45 mm blue load linear stapler.  The staple line was inspected and was intact without bleeding.  The common enterotomy was then closed with a running 2-0 Vicryl, begun at either end and tied centrally.  The Ewald tube was then easily passed through the anastomosis and  an outer anterior layer of running 2-0 Vicryl was placed.  The Ewald tube was removed.  With the outlet of the gastrojejunostomy clamped and under saline irrigation, the assistant performed an upper endoscopy and with a gastric pouch tensely distended with air, there was no evidence of leak on this test.  The pouch was desufflated.  The pouch had was about 6 cm long, however it was wide as previously discussed.  The Terance Hart defect was closed with a running 2-0 silk using the Endo 360.  There was evidence of some bleeding in the left upper abdomen.  There was some bleeding from the gastric pouch staple line near the left crus. Hemostasis was achieved with a single firing of Ligamax clip.  There was excellent hemostasis.  The [facility] liver retractor was removed under direct visualization  after coating the gastrojejunostomy anastomosis with Tisseel tissue sealant.  At this point, I incised the old port site with a #11 blade, divided the subcutaneous tissue, opened up the capsule with electrocautery.  I then extracted the port along with the tubing and removed the old capsule in its entirety.  The 15 mm trocar in the right mid abdomen was removed and the muscle was then reapproximated with the 0 Vicryl using the suture passer under direct visualization. All CO2 was evacuated and the trocars were removed.  Skin incisions were closed with 4-0 Monocryl in a subcuticular fashion followed by the application of Covidien topical skin sealant.  Sponge and needle and instrument counts were correct.  There were no immediate complications. The patient was taken to PACU in good condition.     [name]. [name], MD, FACS     EMW/MEDQ  D:  02/27/2014  T:  02/27/2014  Job:  [id number]

## 2014-02-27 NOTE — Anesthesia Preprocedure Evaluation (Addendum)
Anesthesia Evaluation  Patient identified by MRN, date of birth, ID band Patient awake    Reviewed: Allergy & Precautions, H&P , NPO status , Patient's Chart, lab work & pertinent test results  History of Anesthesia Complications (+) PONV and history of anesthetic complications  Airway Mallampati: II TM Distance: >3 FB Neck ROM: Full    Dental no notable dental hx. (+) Teeth Intact, Dental Advisory Given   Pulmonary neg pulmonary ROS, former smoker,  breath sounds clear to auscultation  Pulmonary exam normal       Cardiovascular negative cardio ROS  Rhythm:Regular Rate:Normal     Neuro/Psych Depression negative neurological ROS     GI/Hepatic negative GI ROS, Neg liver ROS, hiatal hernia,   Endo/Other  negative endocrine ROSMorbid obesity  Renal/GU negative Renal ROS  negative genitourinary   Musculoskeletal negative musculoskeletal ROS (+)   Abdominal (+) + obese,   Peds negative pediatric ROS (+)  Hematology negative hematology ROS (+)   Anesthesia Other Findings   Reproductive/Obstetrics negative OB ROS                          Anesthesia Physical Anesthesia Plan  ASA: III  Anesthesia Plan: General   Post-op Pain Management:    Induction: Intravenous  Airway Management Planned: Oral ETT  Additional Equipment:   Intra-op Plan:   Post-operative Plan: Extubation in OR  Informed Consent: I have reviewed the patients History and Physical, chart, labs and discussed the procedure including the risks, benefits and alternatives for the proposed anesthesia with the patient or authorized representative who has indicated his/her understanding and acceptance.   Dental advisory given  Plan Discussed with: CRNA  Anesthesia Plan Comments:         Anesthesia Quick Evaluation

## 2014-02-27 NOTE — Interval H&P Note (Signed)
History and Physical Interval Note:  02/27/2014 7:09 AM  Lori Montgomery  has presented today for surgery, with the diagnosis of Morbid Obesity  The various methods of treatment have been discussed with the patient and family. After consideration of risks, benefits and other options for treatment, the patient has consented to  Procedure(s):  LAPAROSCOPIC REMOVAL OF LAP BAND LAPAROSCOPIC ROUX-EN-Y GASTRIC BYPASS WITH UPPER ENDOSCOPY (N/A) as a surgical intervention .  The patient's history has been reviewed, patient examined, no change in status, stable for surgery.  I have reviewed the patient's chart and labs.  Questions were answered to the patient's satisfaction.    [name]. [name], MD, [place], Bariatric, & Minimally Invasive Surgery [facility], [place]   [facility]

## 2014-02-27 NOTE — Brief Op Note (Signed)
02/27/2014  11:22 AM  PATIENT:  Lori Montgomery  43 y.o. female  PRE-OPERATIVE DIAGNOSIS:   Morbid obesity BMI 44  Dislipidemia - no meds  Bipolar Depression  S/p Lap adjustable gastric banding 10/04/12  H/o GERD    POST-OPERATIVE DIAGNOSIS:  same  PROCEDURE:  Procedure(s):  LAPAROSCOPIC REMOVAL OF LAP BAND AND PORT,LAPAROSCOPIC ROUX-EN-Y GASTRIC BYPASS WITH UPPER ENDOSCOPY,  (N/A)  SURGEON:  Surgeon(s) and Role:    * [name], MD - Primary    * Pedro Earls, MD - Assisting  PHYSICIAN ASSISTANT:   ASSISTANTS: see above   ANESTHESIA:   general  EBL:  Total I/O In: 3000 [I.V.:3000] Out: 240 [Urine:240]  BLOOD ADMINISTERED:none  DRAINS: none   LOCAL MEDICATIONS USED:  NONE  SPECIMEN:  No Specimen  DISPOSITION OF SPECIMEN:  N/A  COUNTS:  YES  TOURNIQUET:  * No tourniquets in log *  DICTATION: .Other Dictation: Dictation Number [id number]  PLAN OF CARE: Admit to inpatient   PATIENT DISPOSITION:  PACU - hemodynamically stable.   Delay start of Pharmacological VTE agent (>24hrs) due to surgical blood loss or risk of bleeding: no  [name]. [name], MD, FACS General, Bariatric, & Minimally Invasive Surgery [facility], [place]

## 2014-02-27 NOTE — Anesthesia Postprocedure Evaluation (Signed)
Anesthesia Post Note  Patient: Lori Montgomery  Procedure(s) Performed: Procedure(s) (LRB):  LAPAROSCOPIC REMOVAL OF LAP BAND AND PORT,LAPAROSCOPIC ROUX-EN-Y GASTRIC BYPASS WITH UPPER ENDOSCOPY,  (N/A)  Anesthesia type: General  Patient location: PACU  Post pain: Pain level controlled  Post assessment: Post-op Vital signs reviewed  Last Vitals:  Filed Vitals:   02/27/14 1247  BP: 133/86  Pulse: 84  Temp:   Resp: 17    Post vital signs: Reviewed  Level of consciousness: sedated  Complications: No apparent anesthesia complications

## 2014-02-28 ENCOUNTER — Inpatient Hospital Stay (HOSPITAL_COMMUNITY): Payer: BC Managed Care – PPO

## 2014-02-28 ENCOUNTER — Telehealth (HOSPITAL_COMMUNITY): Payer: Self-pay | Admitting: *Deleted

## 2014-02-28 ENCOUNTER — Encounter (HOSPITAL_COMMUNITY): Payer: Self-pay | Admitting: General Surgery

## 2014-02-28 LAB — COMPREHENSIVE METABOLIC PANEL
ALBUMIN: 2.8 g/dL — AB (ref 3.5–5.2)
ALK PHOS: 74 U/L (ref 39–117)
ALT: 57 U/L — ABNORMAL HIGH (ref 0–35)
ANION GAP: 9 (ref 5–15)
AST: 55 U/L — ABNORMAL HIGH (ref 0–37)
BUN: 5 mg/dL — ABNORMAL LOW (ref 6–23)
CO2: 28 mEq/L (ref 19–32)
Calcium: 8.3 mg/dL — ABNORMAL LOW (ref 8.4–10.5)
Chloride: 99 mEq/L (ref 96–112)
Creatinine, Ser: 0.67 mg/dL (ref 0.50–1.10)
GFR calc Af Amer: >90 mL/min (ref >90)
GFR calc non Af Amer: >90 mL/min (ref >90)
Glucose, Bld: 124 mg/dL — ABNORMAL HIGH (ref 70–99)
POTASSIUM: 4.2 meq/L (ref 3.7–5.3)
SODIUM: 136 meq/L — AB (ref 137–147)
TOTAL PROTEIN: 6.2 g/dL (ref 6.0–8.3)
Total Bilirubin: 0.3 mg/dL (ref 0.3–1.2)

## 2014-02-28 LAB — CBC WITH DIFFERENTIAL/PLATELET
BASOS ABS: 0 10*3/uL (ref 0.0–0.1)
BASOS PCT: 0 % (ref 0–1)
EOS ABS: 0.3 10*3/uL (ref 0.0–0.7)
Eosinophils Relative: 4 % (ref 0–5)
HCT: 35.6 % — ABNORMAL LOW (ref 36.0–46.0)
Hemoglobin: 11.3 g/dL — ABNORMAL LOW (ref 12.0–15.0)
Lymphocytes Relative: 21 % (ref 12–46)
Lymphs Abs: 1.4 10*3/uL (ref 0.7–4.0)
MCH: 30.2 pg (ref 26.0–34.0)
MCHC: 31.7 g/dL (ref 30.0–36.0)
MCV: 95.2 fL (ref 78.0–100.0)
Monocytes Absolute: 0.7 10*3/uL (ref 0.1–1.0)
Monocytes Relative: 10 % (ref 3–12)
NEUTROS PCT: 65 % (ref 43–77)
Neutro Abs: 4.4 10*3/uL (ref 1.7–7.7)
PLATELETS: 225 10*3/uL (ref 150–400)
RBC: 3.74 MIL/uL — ABNORMAL LOW (ref 3.87–5.11)
RDW: 13.2 % (ref 11.5–15.5)
WBC: 6.8 10*3/uL (ref 4.0–10.5)

## 2014-02-28 LAB — HEMOGLOBIN AND HEMATOCRIT, BLOOD
HCT: 36.2 % (ref 36.0–46.0)
Hemoglobin: 11.6 g/dL — ABNORMAL LOW (ref 12.0–15.0)

## 2014-02-28 MED ORDER — IOHEXOL 300 MG/ML  SOLN
50.0000 mL | Freq: Once | INTRAMUSCULAR | Status: AC | PRN
  Administered 2014-02-28: 50 mL via ORAL

## 2014-02-28 MED ORDER — ALUM & MAG HYDROXIDE-SIMETH 200-200-20 MG/5ML PO SUSP: 15.0000 mL | Freq: Four times a day (QID) | ORAL | Status: DC | PRN

## 2014-02-28 NOTE — Telephone Encounter (Deleted)
Made discharge phone call to patient per DROP protocol. Asking the following questions.    1. Do you have someone to care for you now that you are home?   2. Are you having pain now that is not relieved by your pain medication?   3. Are you able to drink the recommended daily amount of fluids (48 ounces minimum/day) and protein (60-80 grams/day) as prescribed by the dietitian or nutritional counselor?   4. Are you taking the vitamins and minerals as prescribed?   5. Do you have the "on call" number to contact your surgeon if you have a problem or question?   6. Are your incisions free of redness, swelling or drainage? (If steri strips, address that these can fall off, shower as tolerated)  7. Have your bowels moved since your surgery?  If not, are you passing gas?   8. Are you up and walking 3-4 times per day?   9. Do you have an appointment to see a dietitian or nutritional counselor in the next month?    The following questions can be added at the center's discharge phone call script at the center's discretion.  The questions are also captured within D.R[employer]O.P. project custom fields. 1. Do you have an appointment made to see your surgeon in the next month?   2. Were you provided your discharge medications before your surgery or before you were discharged from the hospital and are you taking them without problem?   3. Were you provided phone numbers to the clinic/surgeon's office?   4. Did you watch the patient education video module in the (clinic, surgeon's office, etc.) before your surgery?  5. Do you have a discharge checklist that was provided to you in the hospital to reference with instructions on how to take care of yourself after surgery?   6. Did you see a dietitian or nutritional counselor while you were in the hospital?   7.

## 2014-02-28 NOTE — Progress Notes (Signed)
Patient alert and oriented, Post op day 1.  Provided support and encouragement.  Encouraged pulmonary toilet, ambulation and small sips of liquids.  All questions answered.  Will continue to monitor. 

## 2014-02-28 NOTE — Progress Notes (Signed)
1 Day Post-Op  Subjective: Slept all day; up all night. Minimal pain. No n/v/indigestion. Just very sore at 1 incision site; ambulated 3 times  Objective: Vital signs in last 24 hours: Temp:  [97.4 F (36.3 C)-98 F (36.7 C)] 97.4 F (36.3 C) (07/22 0550) Pulse Rate:  [75-88] 78 (07/22 0550) Resp:  [9-20] 16 (07/22 0550) BP: (116-160)/(80-105) 116/82 mmHg (07/22 0550) SpO2:  [92 %-99 %] 92 % (07/22 0550) Weight:  [281 lb (127.461 kg)] 281 lb (127.461 kg) (07/21 1346) Last BM Date: 02/25/14  Intake/Output from previous day: 07/21 0701 - 07/22 0700 In: 4964.6 [I.V.:4664.6; IV Piggyback:300] Out: 0355 [Urine:1840] Intake/Output this shift:    Alert, nad cta b/l Reg Obese, incisions c/d/i; expected TTP. No cellulitis No edema  Lab Results:   Recent Labs  02/27/14 1150 02/28/14 0540  WBC  --  6.8  HGB 12.0 11.3*  HCT 36.8 35.6*  PLT  --  225   BMET  Recent Labs  02/28/14 0540  NA 136*  K 4.2  CL 99  CO2 28  GLUCOSE 124*  BUN 5*  CREATININE 0.67  CALCIUM 8.3*   PT/INR No results found for this basename: LABPROT, INR,  in the last 72 hours ABG No results found for this basename: PHART, PCO2, PO2, HCO3,  in the last 72 hours  Studies/Results: No results found.  Anti-infectives: Anti-infectives   Start     Dose/Rate Route Frequency Ordered Stop   02/27/14 0945  cefOXitin (MEFOXIN) 2 g in dextrose 5 % 50 mL IVPB     2 g 100 mL/hr over 30 Minutes Intravenous  Once 02/27/14 0939 02/27/14 0940   02/27/14 0518  cefOXitin (MEFOXIN) 2 g in dextrose 5 % 50 mL IVPB     2 g 100 mL/hr over 30 Minutes Intravenous On call to O.R. 02/27/14 0518 02/27/14 0713      Assessment/Plan: s/p Procedure(s):  LAPAROSCOPIC REMOVAL OF LAP BAND AND PORT,LAPAROSCOPIC ROUX-EN-Y GASTRIC BYPASS WITH UPPER ENDOSCOPY,  (N/A)  No fever, tachycardia For UGI this am, if ok will start POD 1 diet hgb ok - cont lovenox, scds IS, ambulate  [name]. [name], MD, FACS General,  Bariatric, & Minimally Invasive Surgery [facility], [place]   LOS: 1 day    [name] 02/28/2014

## 2014-03-01 DIAGNOSIS — F319 Bipolar disorder, unspecified: Secondary | ICD-10-CM | POA: Diagnosis present

## 2014-03-01 LAB — CBC WITH DIFFERENTIAL/PLATELET
Basophils Absolute: 0 10*3/uL (ref 0.0–0.1)
Basophils Relative: 0 % (ref 0–1)
Eosinophils Absolute: 0.3 10*3/uL (ref 0.0–0.7)
Eosinophils Relative: 5 % (ref 0–5)
HCT: 33.8 % — ABNORMAL LOW (ref 36.0–46.0)
Hemoglobin: 10.8 g/dL — ABNORMAL LOW (ref 12.0–15.0)
LYMPHS PCT: 21 % (ref 12–46)
Lymphs Abs: 1.4 10*3/uL (ref 0.7–4.0)
MCH: 30.7 pg (ref 26.0–34.0)
MCHC: 32 g/dL (ref 30.0–36.0)
MCV: 96 fL (ref 78.0–100.0)
Monocytes Absolute: 0.5 10*3/uL (ref 0.1–1.0)
Monocytes Relative: 8 % (ref 3–12)
Neutro Abs: 4.3 10*3/uL (ref 1.7–7.7)
Neutrophils Relative %: 66 % (ref 43–77)
PLATELETS: 213 10*3/uL (ref 150–400)
RBC: 3.52 MIL/uL — AB (ref 3.87–5.11)
RDW: 13.5 % (ref 11.5–15.5)
WBC: 6.5 10*3/uL (ref 4.0–10.5)

## 2014-03-01 MED ORDER — VENLAFAXINE HCL 75 MG PO TABS: 75.0000 mg | ORAL_TABLET | Freq: Two times a day (BID) | ORAL | Status: DC

## 2014-03-01 MED ORDER — QUETIAPINE FUMARATE 300 MG PO TABS: 600.0000 mg | ORAL_TABLET | Freq: Every day | ORAL | Status: DC

## 2014-03-01 MED ORDER — QUETIAPINE FUMARATE 300 MG PO TABS
600.0000 mg | ORAL_TABLET | Freq: Every day | ORAL | Status: DC
  Filled 2014-03-01: qty 2

## 2014-03-01 MED ORDER — VENLAFAXINE HCL 75 MG PO TABS
75.0000 mg | ORAL_TABLET | Freq: Two times a day (BID) | ORAL | Status: DC
  Administered 2014-03-01: 75 mg via ORAL
  Filled 2014-03-01 (×3): qty 1

## 2014-03-01 MED ORDER — PANTOPRAZOLE SODIUM 40 MG PO TBEC: 40.0000 mg | DELAYED_RELEASE_TABLET | Freq: Every day | ORAL | Status: DC

## 2014-03-01 NOTE — Discharge Instructions (Signed)

## 2014-03-01 NOTE — Progress Notes (Signed)
PHARMACY BRIEF NOTE - MAINTENANCE MEDS POST-BARIATRIC SURGERY  Patient's home regimen included two extended-release medications: Effexor-XR 150mg  every evening,  Seroquel-XR 600mg  every evening.     Recommend: Consider changing Effexor-XR to venlafaxine IR 75mg  BID Consider changing Seroquel-XR to quetiapine IR 600mg  qhs Patient states she would need outpatient prescriptions for these medications.  Message relayed by phone in OR to Dr. [name], receipt acknowledged.  Clayburn Pert, PharmD, BCPS Pager: [phone] 03/01/2014  9:14 AM

## 2014-03-01 NOTE — Progress Notes (Signed)
Assessment unchanged. Pt and husband verbalized understanding of dc instructions through teach back. No scripts at dc. Plans to sign up account with My Chart soon. Pt tolerated protein shake.  Understands when to follow up with MD post dc and when to call if any problems arise. Discharged via wc to front entrance to meet awaiting vehicle to carry home. Accompanied by husband and NT.

## 2014-03-01 NOTE — Care Management Note (Signed)
    Page 1 of 1   03/01/2014     3:24:41 PM CARE MANAGEMENT NOTE 03/01/2014  Patient:  Lori Montgomery, Lori Montgomery   Account Number:  [id number]  Date Initiated:  03/01/2014  Documentation initiated by:  Dessa Phi  Subjective/Objective Assessment:   43 Y/O F ADMITTED W/MORBID OBESITY.     Action/Plan:   FROM HOME.   Anticipated DC Date:  03/01/2014   Anticipated DC Plan:  [place]  CM consult      Choice offered to / List presented to:             Status of service:  Completed, signed off Medicare Important Message given?   (If response is "NO", the following Medicare IM given date fields will be blank) Date Medicare IM given:   Medicare IM given by:   Date Additional Medicare IM given:   Additional Medicare IM given by:    Discharge Disposition:  HOME/SELF CARE  Per UR Regulation:  Reviewed for med. necessity/level of care/duration of stay  If discussed at [place] of Stay Meetings, dates discussed:    Comments:  03/01/14 Nakari Bracknell RN,BSN NCM [phone] S/P LAP BAND.D/C HOME NO NEEDS OR ORDERS.

## 2014-03-01 NOTE — Progress Notes (Signed)
2 Days Post-Op  Subjective: Sore at one trocar site. No n/v with water. No regurg. Ambulated multiple times  Objective: Vital signs in last 24 hours: Temp:  [98.1 F (36.7 C)-98.4 F (36.9 C)] 98.4 F (36.9 C) (07/23 0521) Pulse Rate:  [80-94] 80 (07/23 0521) Resp:  [16-18] 18 (07/23 0521) BP: (108-152)/(71-89) 134/84 mmHg (07/23 0521) SpO2:  [86 %-98 %] 96 % (07/23 0521) Last BM Date: 02/25/14  Intake/Output from previous day: 07/22 0701 - 07/23 0700 In: 3680 [P.O.:180; I.V.:3500] Out: 3700 [Urine:3700] Intake/Output this shift:    Alert, nad cta b/l Reg Obese, some mild TTP - expected; incisions c/d/i- some hyperpigmentation around right sided trocar site - not cellulitis No edema  Lab Results:   Recent Labs  02/28/14 0540 02/28/14 1600 03/01/14 0525  WBC 6.8  --  6.5  HGB 11.3* 11.6* 10.8*  HCT 35.6* 36.2 33.8*  PLT 225  --  213   BMET  Recent Labs  02/28/14 0540  NA 136*  K 4.2  CL 99  CO2 28  GLUCOSE 124*  BUN 5*  CREATININE 0.67  CALCIUM 8.3*   PT/INR No results found for this basename: LABPROT, INR,  in the last 72 hours ABG No results found for this basename: PHART, PCO2, PO2, HCO3,  in the last 72 hours  Studies/Results: Dg Ugi W/water Sol Cm  02/28/2014   CLINICAL DATA:  43 year old female status post gastric band removal and creation of new gastric bypass. Postoperative swallow. Initial encounter.  EXAM: WATER SOLUBLE UPPER GI SERIES  TECHNIQUE: Single-column upper GI series was performed using water soluble contrast.  CONTRAST:  94mL OMNIPAQUE IOHEXOL 300 MG/ML  SOLN  COMPARISON:  Preoperative upper GI 10/13/2013.  FLUOROSCOPY TIME:  1 min and 11 seconds.  FINDINGS: Preprocedural scout view of the abdomen obtained demonstrating staple lines in the left upper quadrant. Stable right upper quadrant surgical clips. Non obstructed bowel gas pattern.  Water-soluble contrast was administered PO. No obstruction to the flow of contrast to the distal  esophagus and in through the gastroesophageal junction. Almost immediately opacification of a gastrojejunostomy occurred, with prompt opacification of other proximal small bowel loops. No obstruction to the [facility] flow of contrast.  Small volume of gastroesophageal reflux did occur spontaneously.  IMPRESSION: No adverse features identified status post removal of gastric band and new gastrojejunostomy. A small volume of spontaneous gastroesophageal reflux was observed.   Electronically Signed   By: Lars Pinks M.D.   On: 02/28/2014 09:08    Anti-infectives: Anti-infectives   Start     Dose/Rate Route Frequency Ordered Stop   02/27/14 0945  cefOXitin (MEFOXIN) 2 g in dextrose 5 % 50 mL IVPB     2 g 100 mL/hr over 30 Minutes Intravenous  Once 02/27/14 0939 02/27/14 0940   02/27/14 0518  cefOXitin (MEFOXIN) 2 g in dextrose 5 % 50 mL IVPB     2 g 100 mL/hr over 30 Minutes Intravenous On call to O.R. 02/27/14 0518 02/27/14 0713      Assessment/Plan: s/p Procedure(s):  LAPAROSCOPIC REMOVAL OF LAP BAND AND PORT,LAPAROSCOPIC ROUX-EN-Y GASTRIC BYPASS WITH UPPER ENDOSCOPY,  (N/A)  No fever, tachycardia, wbc - no clinical signs of leak Tolerated water Will adv to POD 2 Expect d/c later this am. Discussed dc instructions with pt  Lori Montgomery. Lori Pulling, MD, FACS General, Bariatric, & Minimally Invasive Surgery [facility], [place]   LOS: 2 days    Lori Montgomery 03/01/2014

## 2014-03-01 NOTE — Discharge Summary (Signed)
Physician Discharge Summary  Lori Montgomery KKX:[id number] DOB: May 10, 1971 DOA: 02/27/2014  PCP: Vickii Penna., MD  Admit date: 02/27/2014 Discharge date: 03/01/2014  Recommendations for Outpatient Follow-up:    Follow-up Information   Follow up with [name], MD On 03/19/2014. (2:45pm (arrive 2:30 pm), For wound re-check)    Specialty:  General Surgery   Contact information:   [address] [place] [address] [phone]      Discharge Diagnoses:  Active Problems:   Morbid obesity Active Problems:   Obesity, Class III, BMI 40-49.9 (morbid obesity)   Degenerative joint disease   History of laparoscopic adjustable gastric banding 10/04/12   Bipolar depression   Surgical Procedure: Laparoscopic removal of adjustable gastric band and port; Laparoscopic Roux-en-Y gastric bypass, upper endoscopy  Discharge Condition: Good Disposition: Home  Diet recommendation: Postoperative gastric bypass diet  Filed Weights   02/27/14 0515 02/27/14 1346  Weight: 281 lb (127.461 kg) 281 lb (127.461 kg)     Hospital Course:  The patient was admitted for a planned conversion of lap adjustable gastric band to laparoscopic Roux-en-Y gastric bypass. Please see operative note. Preoperatively the patient was given 5000 units of subcutaneous heparin for DVT prophylaxis. Postoperative prophylactic Lovenox dosing was started on the morning of postoperative day 1. The patient underwent an upper GI on postoperative day 1 which demonstrated no extravasation of contrast and emptying of the contrast into the Roux limb. The patient was started on ice chips and water which they tolerated. On postoperative day 2 The patient's diet was advanced to protein shakes which they also tolerated. The patient was ambulating without difficulty. Their vital signs are stable without fever or tachycardia. Their hemoglobin had remained stable. The patient had received discharge instructions and nutrition  counseling. They were deemed stable for discharge.   Discharge Instructions  Discharge Instructions   Call MD for:  difficulty breathing, headache or visual disturbances    Complete by:  As directed      Call MD for:  persistant dizziness or light-headedness    Complete by:  As directed      Call MD for:  persistant nausea and vomiting    Complete by:  As directed      Call MD for:  redness, tenderness, or signs of infection (pain, swelling, redness, odor or green/yellow discharge around incision site)    Complete by:  As directed      Call MD for:  severe uncontrolled pain    Complete by:  As directed      Call MD for:  temperature >100.4    Complete by:  As directed      Discharge instructions    Complete by:  As directed   See bariatric discharge instructions     Increase activity slowly    Complete by:  As directed             Medication List    STOP taking these medications       venlafaxine XR 75 MG 24 hr capsule  Commonly known as:  EFFEXOR-XR  Replaced by:  venlafaxine 75 MG tablet      TAKE these medications       oxyCODONE 5 MG/5ML solution  Commonly known as:  ROXICODONE  Take 5-10 mLs (5-10 mg total) by mouth every 4 (four) hours as needed for severe pain.     QUEtiapine 300 MG tablet  Commonly known as:  SEROQUEL  Take 2 tablets (600 mg total) by  mouth at bedtime.     venlafaxine 75 MG tablet  Commonly known as:  EFFEXOR  Take 1 tablet (75 mg total) by mouth 2 (two) times daily with a meal.           Follow-up Information   Follow up with [name], MD On 03/19/2014. (2:45pm (arrive 2:30 pm), For wound re-check)    Specialty:  General Surgery   Contact information:   [place] [address] [phone]        The results of significant diagnostics from this hospitalization (including imaging, microbiology, ancillary and laboratory) are listed below for reference.    Significant Diagnostic Studies: Dg Ugi  W/water Sol Cm  03/26/2014   CLINICAL DATA:  43 year old female status post gastric band removal and creation of new gastric bypass. Postoperative swallow. Initial encounter.  EXAM: WATER SOLUBLE UPPER GI SERIES  TECHNIQUE: Single-column upper GI series was performed using water soluble contrast.  CONTRAST:  61mL OMNIPAQUE IOHEXOL 300 MG/ML  SOLN  COMPARISON:  Preoperative upper GI 10/13/2013.  FLUOROSCOPY TIME:  1 min and 11 seconds.  FINDINGS: Preprocedural scout view of the abdomen obtained demonstrating staple lines in the left upper quadrant. Stable right upper quadrant surgical clips. Non obstructed bowel gas pattern.  Water-soluble contrast was administered PO. No obstruction to the flow of contrast to the distal esophagus and in through the gastroesophageal junction. Almost immediately opacification of a gastrojejunostomy occurred, with prompt opacification of other proximal small bowel loops. No obstruction to the [facility] flow of contrast.  Small volume of gastroesophageal reflux did occur spontaneously.  IMPRESSION: No adverse features identified status post removal of gastric band and new gastrojejunostomy. A small volume of spontaneous gastroesophageal reflux was observed.   Electronically Signed   By: Lars Pinks M.D.   On: 03/26/2014 09:08    Labs: Basic Metabolic Panel:  Recent Labs Lab Mar 26, 2014 0540  NA 136*  K 4.2  CL 99  CO2 28  GLUCOSE 124*  BUN 5*  CREATININE 0.67  CALCIUM 8.3*   Liver Function Tests:  Recent Labs Lab 03/26/14 0540  AST 55*  ALT 57*  ALKPHOS 74  BILITOT 0.3  PROT 6.2  ALBUMIN 2.8*    CBC:  Recent Labs Lab 02/27/14 1150 03/26/2014 0540 Mar 26, 2014 1600 03/01/14 0525  WBC  --  6.8  --  6.5  NEUTROABS  --  4.4  --  4.3  HGB 12.0 11.3* 11.6* 10.8*  HCT 36.8 35.6* 36.2 33.8*  MCV  --  95.2  --  96.0  PLT  --  225  --  213    CBG: No results found for this basename: GLUCAP,  in the last 168 hours  Active Problems:   Morbid obesity   Time  coordinating discharge: 15 minutes  Signed:  [name], MD [facility], [place] [phone] 03/01/2014, 12:33 PM

## 2014-03-06 ENCOUNTER — Telehealth: Payer: Self-pay | Admitting: Dietician

## 2014-03-06 NOTE — Telephone Encounter (Signed)
Patient called [facility] and left message with front desk staff member.  Returned patient's call. She is 1 week post op RYGB and reports that she has been having protein shakes and strained cream soups. Lori Montgomery reports increased appetite and is "not sure if she can do liquids for another week." She is not having any nausea, vomiting, or intolerance to full liquids. I advised her to remain on the liquid diet until 10 days post op. Encouraged her to introduce soft protein foods slowly at 10 days post op. Suggested eggs, cheese, deli meat, and soft moistened meats. Reminded her to chew foods thoroughly.

## 2014-03-13 ENCOUNTER — Encounter: Payer: BC Managed Care – PPO | Attending: General Surgery

## 2014-03-13 VITALS — Ht 67.5 in | Wt 270.0 lb

## 2014-03-13 DIAGNOSIS — Z01818 Encounter for other preprocedural examination: Secondary | ICD-10-CM | POA: Diagnosis not present

## 2014-03-13 DIAGNOSIS — Z713 Dietary counseling and surveillance: Secondary | ICD-10-CM | POA: Insufficient documentation

## 2014-03-13 NOTE — Patient Instructions (Signed)
Patient to follow Phase 3A-Soft, High Protein Diet and follow-up at [facility] in 6 weeks for 2 months post-op nutrition visit for diet advancement. 

## 2014-03-13 NOTE — Progress Notes (Signed)
Bariatric Class:  Appt start time: 1530 end time:  1630.  2 Week Post-Operative Nutrition Class  Patient was seen on 03/13/14 for Post-Operative Nutrition [facility].   Surgery date: 02/27/2014 Surgery type: RYGB Start weight at [facility]: 283.5 lbs  On 02/12/14 Weight today: 283.5 lbs Weight change: 13.5 lbs  TANITA  BODY COMP RESULTS  02/12/14 03/13/14   BMI (kg/m^2) 43.7 41.7   Fat Mass (lbs) 147.5 144.0   Fat Free Mass (lbs) 136.0 126.0   Total Body Water (lbs) 99.5 92.0     The following the learning objectives were met by the patient during this course:  Identifies Phase 3A (Soft, High Proteins) Dietary Goals and will begin from 2 weeks post-operatively to 2 months post-operatively  Identifies appropriate sources of fluids and proteins   States protein recommendations and appropriate sources post-operatively  Identifies the need for appropriate texture modifications, mastication, and bite sizes when consuming solids  Identifies appropriate multivitamin and calcium sources post-operatively  Describes the need for physical activity post-operatively and will follow MD recommendations  States when to call healthcare provider regarding medication questions or post-operative complications  Handouts given during class include:  Phase 3A: Soft, High Protein Diet Handout  Follow-Up Plan: Patient will follow-up at [facility] in 6 weeks for 2 month post-op nutrition visit for diet advancement per MD.

## 2014-03-15 ENCOUNTER — Telehealth (INDEPENDENT_AMBULATORY_CARE_PROVIDER_SITE_OTHER): Payer: Self-pay

## 2014-03-15 NOTE — Telephone Encounter (Signed)
Pt s/p RNY 02/27/14. Pt states that today when she stood up she was dizzy and fainted. Pt states that she has not had this before and since this has happened she has not had any other issues. Pt denies any fevers, chills, n/v or pain. Pt states that she has been eating and drinking good. Advised pt that when she went from a lying position to sit on the side of the bed or chair for a couple minutes then try and get up. Advised pt that if this continues to give [place] a call back. Pt verbalized understanding and agrees with POC.

## 2014-03-19 ENCOUNTER — Ambulatory Visit (INDEPENDENT_AMBULATORY_CARE_PROVIDER_SITE_OTHER): Payer: BC Managed Care – PPO | Admitting: General Surgery

## 2014-03-20 NOTE — Telephone Encounter (Signed)
Called to check on patient. She states she is doing great. She denies any fever, chills, nausea, vomiting, regurgitation or bowel pain. She is getting in 72 ounces of liquid a day. She has not had any additional fainting episodes. She states that she has lost about 16 pounds since surgery. Explain for some reason her postop appointment with me was for 2 months after surgery which is incorrect. Advised her to come into the office on August 21 at 10 AM. She states that she could make that appointment. Advised her that however her office change her appointment to that date and time and they would call her the day before to remind her of the appointment

## 2014-03-28 ENCOUNTER — Other Ambulatory Visit (HOSPITAL_COMMUNITY): Payer: Self-pay | Admitting: General Surgery

## 2014-03-30 ENCOUNTER — Encounter (INDEPENDENT_AMBULATORY_CARE_PROVIDER_SITE_OTHER): Payer: Self-pay | Admitting: General Surgery

## 2014-03-30 ENCOUNTER — Ambulatory Visit (INDEPENDENT_AMBULATORY_CARE_PROVIDER_SITE_OTHER): Payer: BC Managed Care – PPO | Admitting: General Surgery

## 2014-03-30 VITALS — BP 130/84 | HR 71 | Temp 98.6°F | Resp 16 | Ht 67.0 in | Wt 266.6 lb

## 2014-03-30 NOTE — Progress Notes (Signed)
Subjective:     Patient ID: Lori Montgomery, female   DOB: June 30, 1971, 43 y.o.   MRN: [id number]  HPI 43 year old Caucasian female comes in today for her first appointment after undergoing laparoscopic removal of adjustable gastric band with conversion to laparoscopic Roux-en-Y gastric bypass on July 21. She states she is doing great. She denies any fever, chills, nausea or heartburn. She does have some constipation. She has been taking laxatives. She denies any melena or hematochezia. She denies any numbness or tingling. She states she is taking her supplements. She does still have some ongoing fatigue. She is getting in about 72 ounces of liquid a day. Her protein intake is around 40 g. She has had problems with steak otherwise she has no problems with chicken. She does report some bloating at night.  Review of Systems     Objective:   Physical Exam BP 130/84  Pulse 71  Temp(Src) 98.6 F (37 C) (Temporal)  Resp 16  Ht 5\' 7"  (1.702 m)  Wt 266 lb 9.6 oz (120.929 kg)  BMI 41.75 kg/m2  Gen: alert, NAD, non-toxic appearing Pupils: equal, no scleral icterus Pulm: Lungs clear to auscultation, symmetric chest rise CV: regular rate and rhythm Abd: soft, nontender, nondistended. Well-healed trocar sites. No cellulitis. No incisional hernia Ext: no edema, no calf tenderness Skin: no rash, no jaundice     Assessment:     Status post laparoscopic removal of adjustable gastric band with conversion to laparoscopic Roux-en-Y gastric bypass     Plan:     Overall I think she is doing well. There are no signs of complications from surgery. She has lost approximately 15-1/2 pounds since surgery. I congratulated her on her weight loss.  I explained that fatigue is not uncommon and it should continue to improve the further we get out from surgery.  We discussed the importance of proper eating techniques and behaviors. We discussed importance of proper food choices. We discussed the importance of  drinking plenty of liquids and trying to reach 50 g of protein a day.  I believe the bloating at night is due to the fact that she is eating too close to bedtime. Advised her not to eating anything within 2 hours at that time.  We also then discussed routine exercise and the importance of it. We talked about different strategies to increase her daily activity. Followup in 6-8 weeks   [name]. [name], MD, FACS General, Bariatric, & Minimally Invasive Surgery [facility]  Note: This dictation was prepared with Dragon/digital dictation along with [facility] technology. Any transcriptional errors that result from this process are unintentional.

## 2014-03-30 NOTE — Patient Instructions (Signed)
Start tracking daily steps and making weekly increments to your daily step count  Eating techniques 20-20-20 (30-30-30) 20 chews, 20 seconds between bites of food, 20 minutes to eat; sometimes you may need 30 chews, 30 seconds etc Use your nondominant hand to eat with Put fork down between bites of food Use a timer after swallowing to reinforce waiting 20-30 sec between bites of food Use a child/infant size utensil Try not to eat while watching TV

## 2014-04-24 ENCOUNTER — Ambulatory Visit: Payer: BC Managed Care – PPO | Admitting: Dietician

## 2014-05-03 ENCOUNTER — Ambulatory Visit (INDEPENDENT_AMBULATORY_CARE_PROVIDER_SITE_OTHER): Payer: BC Managed Care – PPO | Admitting: General Surgery

## 2014-05-03 ENCOUNTER — Encounter: Payer: BC Managed Care – PPO | Attending: General Surgery | Admitting: Dietician

## 2014-05-03 DIAGNOSIS — Z713 Dietary counseling and surveillance: Secondary | ICD-10-CM | POA: Diagnosis present

## 2014-05-03 DIAGNOSIS — Z01818 Encounter for other preprocedural examination: Secondary | ICD-10-CM | POA: Insufficient documentation

## 2014-05-03 NOTE — Patient Instructions (Addendum)
-  Avoid skipping breakfast -Limit weighing yourself to 2x a week  -Work on taking supplements consistently -Limit carbs to about 15 grams per meal   TANITA  BODY COMP RESULTS  02/12/14 03/13/14 05/03/14   BMI (kg/m^2) 43.7 41.7 40   Fat Mass (lbs) 147.5 144.0 125.5   Fat Free Mass (lbs) 136.0 126.0 133.5   Total Body Water (lbs) 99.5 92.0 97.5

## 2014-05-03 NOTE — Progress Notes (Signed)
  Follow-up visit:  8 Weeks Post-Operative RYGB Surgery  Medical Nutrition Therapy:  Appt start time: 9826 end time:  1215.  Primary concerns today: Post-operative Bariatric Surgery Nutrition Management. [name] returns today stating that she is feeling well and has not experienced any food intolerances except with steak. She states that she understands when she is full. She no longer likes protein shakes.  She has been considering a diuretic, as she is retaining fluid.    Surgery date: 02/27/2014 Surgery type: RYGB Start weight at [facility]: 283.5 lbs  On 02/12/14 Weight today: 283.5 lbs Weight change: 13.5 lbs  TANITA  BODY COMP RESULTS  02/12/14 03/13/14 05/03/14   BMI (kg/m^2) 43.7 41.7 40   Fat Mass (lbs) 147.5 144.0 125.5   Fat Free Mass (lbs) 136.0 126.0 133.5   Total Body Water (lbs) 99.5 92.0 97.5    Preferred Learning Style:  No preference indicated   Learning Readiness:   Ready   24-hr recall: B (AM): skips Snk (AM):  L ( PM): deli meat and cheese or [name] chili (12-18g)  Snk ( PM):   D (6 PM): 3-4 oz chicken and green beans (21-28g) Snk ( PM):   Fluid intake: 64+ oz of flavored water Estimated total protein intake: difficult to assess  Medications: no longer on Effexor Supplementation: inconsistent  Using straws: no Drinking while eating: no Hair loss: no Carbonated beverages: sips occasionally N/V/D/C: constipation, takes Miralax and Milk of Magnesia Dumping syndrome: none  Recent physical activity:  none  Progress Towards Goal(s):  In progress.  Handouts given during visit include:  Phase 3B lean protein + non-starchy vegetables   Nutritional Diagnosis:  [employer]-3.3 Overweight/obesity related to past poor dietary habits and physical inactivity as evidenced by patient w/ recent RYGB surgery following dietary guidelines for continued weight loss.     Intervention:  Nutrition counseling provided.  Teaching Method Utilized:  Visual Auditory  Barriers to  learning/adherence to lifestyle change: none  Demonstrated degree of understanding via:  Teach Back   Monitoring/Evaluation:  Dietary intake, exercise, and body weight. Follow up in 1.5 months for 3.5 month post-op visit.

## 2014-05-10 ENCOUNTER — Encounter: Payer: Self-pay | Admitting: Internal Medicine

## 2014-05-10 ENCOUNTER — Ambulatory Visit (INDEPENDENT_AMBULATORY_CARE_PROVIDER_SITE_OTHER): Payer: BC Managed Care – PPO | Admitting: Internal Medicine

## 2014-05-10 VITALS — BP 120/90 | HR 98 | Ht 67.0 in | Wt 259.0 lb

## 2014-05-10 DIAGNOSIS — Z8249 Family history of ischemic heart disease and other diseases of the circulatory system: Secondary | ICD-10-CM | POA: Insufficient documentation

## 2014-05-10 DIAGNOSIS — F319 Bipolar disorder, unspecified: Secondary | ICD-10-CM

## 2014-05-10 DIAGNOSIS — Z9884 Bariatric surgery status: Secondary | ICD-10-CM

## 2014-05-10 DIAGNOSIS — F3130 Bipolar disorder, current episode depressed, mild or moderate severity, unspecified: Secondary | ICD-10-CM

## 2014-05-10 NOTE — Progress Notes (Signed)
OFFICE NOTE  Chief Complaint:  Cardiovascular risk assessment  Primary Care Physician: Helane Rima, MD  HPI:  Lori Montgomery is a pleasant 43 year old female Who has a strong family history of coronary disease in both parents and grandparents. Her past medical history significant for morbid obesity and she status post lap banding and eventually gastric bypass surgery. Recently she's lost about 30 pounds. She's also struggled with bipolar depression and recently had a change in her medications which she says makes her feel more fatigued and she does not feel that it is necessarily as effective. She denies any symptoms of chest pain or shortness of breath that are concerning for angina. She occasionally gets a sharp pain in the chest it lasts only for a few seconds. She is operatively active due to fatigue which again may be related to her medications. It also may be related to the fact that she's had decreased food intake after her gastric bypass.  PMHx:  Past Medical History  Diagnosis Date  . Depression   . Degenerative joint disease   . Morbid obesity with BMI of 40.0-44.9, adult   . H/O hiatal hernia   . PONV (postoperative nausea and vomiting)     states also had localized reaction to IV meds during/following endoscopy 2011 requiring IV Benadryl- ? location of test  . Granuloma annulare     Past Surgical History  Procedure Laterality Date  . Breast reduction surgery  2008    with scar revision  . Laparoscopic cholecystectomy  03/2010    Dr Johney Maine  . Knee arthroscopy      right knee/ with ACL REPAIR  . Laparoscopic gastric banding N/A 10/04/2012    Procedure: LAPAROSCOPIC GASTRIC BANDING;  Surgeon: [name], MD,FACS;  Location: [facility];  Service: General;  Laterality: N/A;  Laparoscopic Adjustable Gastric Banding,   . Mesh applied to lap port N/A 10/04/2012    Procedure: MESH APPLIED TO LAP PORT;  Surgeon: [name], MD,FACS;  Location: [facility];  Service: General;   Laterality: N/A;  . Gastric roux-en-y N/A 02/27/2014    Procedure:  LAPAROSCOPIC REMOVAL OF LAP BAND AND PORT,LAPAROSCOPIC ROUX-EN-Y GASTRIC BYPASS WITH UPPER ENDOSCOPY, ;  Surgeon: [name], MD;  Location: [facility];  Service: General;  Laterality: N/A;    FAMHx:  Family History  Problem Relation Age of Onset  . Diabetes Mother     stroke, hypertension  . Lung cancer Maternal Grandmother   . Breast cancer Maternal Aunt   . Diabetes Father     minor heart attack  . Heart disease Paternal Grandmother     MI  . Heart disease Paternal Grandfather     MI  . Heart attack Maternal Grandfather     SOCHx:   reports that she has been smoking.  She has never used smokeless tobacco. She reports that she does not drink alcohol or use illicit drugs.  ALLERGIES:  No Known Allergies  ROS: A comprehensive review of systems was negative.  HOME MEDS: Current Outpatient Prescriptions  Medication Sig Dispense Refill  . Calcium 1500 MG tablet Take 1,500 mg by mouth daily.      . Cyanocobalamin (VITAMIN B-12 SL) Place under the tongue daily.      . Multiple Vitamin (MULTIVITAMIN) capsule Take 1 capsule by mouth daily.      . polyethylene glycol (MIRALAX / GLYCOLAX) packet Take 17 g by mouth 2 (two) times daily.       . QUEtiapine (  SEROQUEL) 400 MG tablet Take 800 mg by mouth daily.       No current facility-administered medications for this visit.    LABS/IMAGING: No results found for this or any previous visit (from the past 48 hour(s)). No results found.  VITALS: BP 120/90  Pulse 98  Ht 5\' 7"  (1.702 m)  Wt 259 lb (117.482 kg)  BMI 40.56 kg/m2  EXAM: General appearance: alert, no distress and moderately obese Neck: no carotid bruit and no JVD Lungs: clear to auscultation bilaterally Heart: regular rate and rhythm, S1, S2 normal, no murmur, click, rub or gallop Abdomen: soft, non-tender; bowel sounds normal; no masses,  no organomegaly Extremities: extremities normal,  atraumatic, no cyanosis or edema Pulses: 2+ and symmetric Skin: Skin color, texture, turgor normal. No rashes or lesions Neurologic: Grossly normal Psych: Pleasant  EKG: Normal sinus rhythm at 98  ASSESSMENT: 1. Family history of premature coronary disease 2. Morbid obesity status post gastric bypass  PLAN: 1.   Ms. Davidoff has family history and risk factors for coronary disease given her obesity however weight loss efforts are in place. She does not have any significant hypertension or dyslipidemia. She is nondiabetic and occasionally smokes a few cigarettes but is generally not a smoker. I have strongly counseled for smoking cessation. She is interested in further cardiovascular risk stratification. I would recommend a coronary calcium score which would be helpful if her calcium score is 0 or fairly low. Recent studies have indicated that this carries a good negative predictive value out to about 10 years after the initial study.  I will contact her with the results of the study and plan to see her back annually.  Pixie Casino, MD, [facility] Attending Cardiologist [facility]  HILTY,Kenneth C 05/10/2014, 12:44 PM

## 2014-05-10 NOTE — Patient Instructions (Addendum)
Your physician recommends that you schedule a follow-up appointment 1 year with Dr. Debara Pickett  Dr. Debara Pickett has ordered a coronary calcium scoring test - done at [facility] [address] ** we will call you with test results

## 2014-05-16 ENCOUNTER — Inpatient Hospital Stay: Admission: RE | Admit: 2014-05-16 | Payer: BC Managed Care – PPO | Source: Ambulatory Visit

## 2014-05-24 ENCOUNTER — Ambulatory Visit (INDEPENDENT_AMBULATORY_CARE_PROVIDER_SITE_OTHER): Payer: BC Managed Care – PPO | Admitting: General Surgery

## 2014-06-20 ENCOUNTER — Ambulatory Visit: Payer: BC Managed Care – PPO | Admitting: Dietician

## 2014-06-20 DIAGNOSIS — D170 Benign lipomatous neoplasm of skin and subcutaneous tissue of head, face and neck: Secondary | ICD-10-CM | POA: Insufficient documentation

## 2014-09-07 NOTE — Telephone Encounter (Signed)
Encounter created in error

## 2014-12-05 ENCOUNTER — Other Ambulatory Visit: Payer: Self-pay | Admitting: Gynecology

## 2014-12-06 LAB — CYTOLOGY - PAP

## 2014-12-12 ENCOUNTER — Other Ambulatory Visit: Payer: Self-pay | Admitting: Gynecology

## 2015-04-16 ENCOUNTER — Other Ambulatory Visit (HOSPITAL_BASED_OUTPATIENT_CLINIC_OR_DEPARTMENT_OTHER): Payer: Self-pay | Admitting: Family Medicine

## 2015-04-16 DIAGNOSIS — R52 Pain, unspecified: Secondary | ICD-10-CM

## 2015-07-02 NOTE — H&P (Addendum)
Lori Montgomery is an 44 y.o. female G4P2 with fibroids and menorrhagia presents for surgical mngt.    No LMP recorded.    Past Medical History  Diagnosis Date  . Depression   . Degenerative joint disease   . Morbid obesity with BMI of 40.0-44.9, adult   . H/O hiatal hernia   . PONV (postoperative nausea and vomiting)     states also had localized reaction to IV meds during/following endoscopy 2011 requiring IV Benadryl- ? location of test  . Granuloma annulare     Past Surgical History  Procedure Laterality Date  . Breast reduction surgery  2008    with scar revision  . Laparoscopic cholecystectomy  03/2010    Dr Johney Maine  . Knee arthroscopy      right knee/ with ACL REPAIR  . Laparoscopic gastric banding N/A 10/04/2012    Procedure: LAPAROSCOPIC GASTRIC BANDING;  Surgeon: [name], MD,FACS;  Location: [facility];  Service: General;  Laterality: N/A;  Laparoscopic Adjustable Gastric Banding,   . Mesh applied to lap port N/A 10/04/2012    Procedure: MESH APPLIED TO LAP PORT;  Surgeon: [name], MD,FACS;  Location: [facility];  Service: General;  Laterality: N/A;  . Gastric roux-en-y N/A 02/27/2014    Procedure:  LAPAROSCOPIC REMOVAL OF LAP BAND AND PORT,LAPAROSCOPIC ROUX-EN-Y GASTRIC BYPASS WITH UPPER ENDOSCOPY, ;  Surgeon: [name], MD;  Location: [facility];  Service: General;  Laterality: N/A;    Family History  Problem Relation Age of Onset  . Diabetes Mother     stroke, hypertension  . Lung cancer Maternal Grandmother   . Breast cancer Maternal Aunt   . Diabetes Father     minor heart attack  . Heart disease Paternal Grandmother     MI  . Heart disease Paternal Grandfather     MI  . Heart attack Maternal Grandfather     Social History:  reports that she has been smoking.  She has never used smokeless tobacco. She reports that she does not drink alcohol or use illicit drugs.  Allergies: No Known Allergies  Meds:  Lisinopril, HCTZ, effexor, abilify  ROS  AF, vSS   Physical Exam  Gen - NAD CV - RRR Lungs - clear Abd - soft, NT/ND PV - uterus mobile, NT, no adnexal masses  PV [place]:  2.6cm right adnexal fibroid ? Cervical.  No adnexal masses or free fluid  Assessment/Plan: Fibroids, menorrhagia TAH Plan of care discussed, questions answered, informed consent  Lori Montgomery 07/02/2015, 5:03 PM

## 2015-07-09 ENCOUNTER — Other Ambulatory Visit: Payer: Self-pay

## 2015-07-09 ENCOUNTER — Encounter (HOSPITAL_COMMUNITY)
Admission: RE | Admit: 2015-07-09 | Discharge: 2015-07-09 | Disposition: A | Discharge status: Home / Self Care | Payer: 59 | Source: Ambulatory Visit | Attending: Obstetrics and Gynecology | Admitting: Obstetrics and Gynecology

## 2015-07-09 ENCOUNTER — Encounter (HOSPITAL_COMMUNITY): Payer: Self-pay

## 2015-07-09 LAB — CBC
HCT: 39.2 % (ref 36.0–46.0)
HEMOGLOBIN: 12.4 g/dL (ref 12.0–15.0)
MCH: 28.1 pg (ref 26.0–34.0)
MCHC: 31.6 g/dL (ref 30.0–36.0)
MCV: 88.9 fL (ref 78.0–100.0)
PLATELETS: 266 10*3/uL (ref 150–400)
RBC: 4.41 MIL/uL (ref 3.87–5.11)
RDW: 13.9 % (ref 11.5–15.5)
WBC: 7.2 10*3/uL (ref 4.0–10.5)

## 2015-07-09 LAB — BASIC METABOLIC PANEL
ANION GAP: 7 (ref 5–15)
BUN: 12 mg/dL (ref 6–20)
CHLORIDE: 102 mmol/L (ref 101–111)
CO2: 26 mmol/L (ref 22–32)
Calcium: 8.6 mg/dL — ABNORMAL LOW (ref 8.9–10.3)
Creatinine, Ser: 0.78 mg/dL (ref 0.44–1.00)
GFR calc non Af Amer: >60 mL/min (ref >60)
Glucose, Bld: 101 mg/dL — ABNORMAL HIGH (ref 65–99)
POTASSIUM: 3.6 mmol/L (ref 3.5–5.1)
SODIUM: 135 mmol/L (ref 135–145)

## 2015-07-09 NOTE — Patient Instructions (Signed)
Your procedure is scheduled on:  July 11, 2015   Enter through the Main Entrance of [facility] at: 6:00 am   Pick up the phone at the desk and dial [phone].  Call this number if you have problems the morning of surgery: [phone].  Remember: Do NOT eat food: after midnight on Thursday  Do NOT drink clear liquids after: after midnight on Thursday  Take these medicines the morning of surgery with a SIP OF WATER:  HCTZ, Losartan   Do NOT wear jewelry (body piercing), metal hair clips/bobby pins, make-up, or nail polish. Do NOT wear lotions, powders, or perfumes.  You may wear deoderant. Do NOT shave for 48 hours prior to surgery. Do NOT bring valuables to the hospital. Contacts, dentures, or bridgework may not be worn into surgery. Leave suitcase in car.  After surgery it may be brought to your room.  For patients admitted to the hospital, checkout time is 11:00 AM the day of discharge.

## 2015-07-10 MED ORDER — DEXTROSE 5 % IV SOLN
2.0000 g | INTRAVENOUS | Status: AC
  Administered 2015-07-11: 2 g via INTRAVENOUS
  Filled 2015-07-10: qty 2

## 2015-07-11 ENCOUNTER — Encounter (HOSPITAL_COMMUNITY)
Admission: RE | Disposition: A | Discharge status: Home / Self Care | Payer: Self-pay | Source: Ambulatory Visit | Attending: Obstetrics and Gynecology

## 2015-07-11 ENCOUNTER — Inpatient Hospital Stay (HOSPITAL_COMMUNITY): Payer: 59 | Admitting: Anesthesiology

## 2015-07-11 ENCOUNTER — Encounter (HOSPITAL_COMMUNITY): Payer: Self-pay | Admitting: Anesthesiology

## 2015-07-11 ENCOUNTER — Inpatient Hospital Stay (HOSPITAL_COMMUNITY)
Admission: RE | Admit: 2015-07-11 | Discharge: 2015-07-12 | DRG: 742 | Disposition: A | Discharge status: Home / Self Care | Payer: 59 | Source: Ambulatory Visit | Attending: Obstetrics and Gynecology | Admitting: Obstetrics and Gynecology

## 2015-07-11 DIAGNOSIS — D259 Leiomyoma of uterus, unspecified: Secondary | ICD-10-CM | POA: Diagnosis present

## 2015-07-11 DIAGNOSIS — Z6841 Body Mass Index (BMI) 40.0 and over, adult: Secondary | ICD-10-CM

## 2015-07-11 DIAGNOSIS — F17210 Nicotine dependence, cigarettes, uncomplicated: Secondary | ICD-10-CM | POA: Diagnosis present

## 2015-07-11 DIAGNOSIS — N920 Excessive and frequent menstruation with regular cycle: Secondary | ICD-10-CM | POA: Diagnosis present

## 2015-07-11 DIAGNOSIS — D219 Benign neoplasm of connective and other soft tissue, unspecified: Secondary | ICD-10-CM | POA: Diagnosis present

## 2015-07-11 HISTORY — PX: BILATERAL SALPINGECTOMY: SHX5743

## 2015-07-11 HISTORY — PX: ABDOMINAL HYSTERECTOMY: SHX81

## 2015-07-11 LAB — PREGNANCY, URINE: Preg Test, Ur: NEGATIVE

## 2015-07-11 SURGERY — HYSTERECTOMY, ABDOMINAL
Anesthesia: General

## 2015-07-11 MED ORDER — KETOROLAC TROMETHAMINE 30 MG/ML IJ SOLN
30.0000 mg | Freq: Once | INTRAMUSCULAR | Status: AC | PRN
  Administered 2015-07-11: 30 mg via INTRAVENOUS

## 2015-07-11 MED ORDER — DIPHENHYDRAMINE HCL 50 MG/ML IJ SOLN: 12.5000 mg | Freq: Four times a day (QID) | INTRAMUSCULAR | Status: DC | PRN

## 2015-07-11 MED ORDER — PROPOFOL 10 MG/ML IV BOLUS
INTRAVENOUS | Status: AC
  Filled 2015-07-11: qty 20

## 2015-07-11 MED ORDER — DIPHENHYDRAMINE HCL 12.5 MG/5ML PO ELIX: 12.5000 mg | ORAL_SOLUTION | Freq: Four times a day (QID) | ORAL | Status: DC | PRN

## 2015-07-11 MED ORDER — DEXTROSE IN LACTATED RINGERS 5 % IV SOLN: INTRAVENOUS | Status: DC

## 2015-07-11 MED ORDER — OXYCODONE-ACETAMINOPHEN 5-325 MG PO TABS: 1.0000 | ORAL_TABLET | ORAL | Status: DC | PRN

## 2015-07-11 MED ORDER — MENTHOL 3 MG MT LOZG: 1.0000 | LOZENGE | OROMUCOSAL | Status: DC | PRN

## 2015-07-11 MED ORDER — HYDROMORPHONE HCL 1 MG/ML IJ SOLN
0.2500 mg | INTRAMUSCULAR | Status: DC | PRN
  Administered 2015-07-11 (×2): 0.5 mg via INTRAVENOUS

## 2015-07-11 MED ORDER — ONDANSETRON HCL 4 MG/2ML IJ SOLN
INTRAMUSCULAR | Status: AC
  Filled 2015-07-11: qty 2

## 2015-07-11 MED ORDER — LOSARTAN POTASSIUM 50 MG PO TABS
100.0000 mg | ORAL_TABLET | Freq: Every day | ORAL | Status: DC
  Administered 2015-07-12: 100 mg via ORAL
  Filled 2015-07-11: qty 2

## 2015-07-11 MED ORDER — SCOPOLAMINE 1 MG/3DAYS TD PT72
1.0000 | MEDICATED_PATCH | Freq: Once | TRANSDERMAL | Status: DC
  Administered 2015-07-11: 1.5 mg via TRANSDERMAL

## 2015-07-11 MED ORDER — ROCURONIUM BROMIDE 100 MG/10ML IV SOLN
INTRAVENOUS | Status: AC
  Filled 2015-07-11: qty 1

## 2015-07-11 MED ORDER — MIDAZOLAM HCL 2 MG/2ML IJ SOLN
INTRAMUSCULAR | Status: AC
  Filled 2015-07-11: qty 2

## 2015-07-11 MED ORDER — VENLAFAXINE HCL ER 75 MG PO CP24
225.0000 mg | ORAL_CAPSULE | Freq: Every day | ORAL | Status: DC
  Administered 2015-07-12: 225 mg via ORAL
  Filled 2015-07-11: qty 1

## 2015-07-11 MED ORDER — NALOXONE HCL 0.4 MG/ML IJ SOLN: 0.4000 mg | INTRAMUSCULAR | Status: DC | PRN

## 2015-07-11 MED ORDER — MEPERIDINE HCL 25 MG/ML IJ SOLN: 6.2500 mg | INTRAMUSCULAR | Status: DC | PRN

## 2015-07-11 MED ORDER — HYDROCHLOROTHIAZIDE 25 MG PO TABS
25.0000 mg | ORAL_TABLET | Freq: Every day | ORAL | Status: DC
  Administered 2015-07-12: 25 mg via ORAL
  Filled 2015-07-11: qty 1

## 2015-07-11 MED ORDER — PROMETHAZINE HCL 25 MG/ML IJ SOLN: 6.2500 mg | INTRAMUSCULAR | Status: DC | PRN

## 2015-07-11 MED ORDER — PROPOFOL 10 MG/ML IV BOLUS
INTRAVENOUS | Status: DC | PRN
  Administered 2015-07-11: 200 mg via INTRAVENOUS

## 2015-07-11 MED ORDER — ONDANSETRON HCL 4 MG/2ML IJ SOLN: 4.0000 mg | Freq: Four times a day (QID) | INTRAMUSCULAR | Status: DC | PRN

## 2015-07-11 MED ORDER — HYDROMORPHONE 1 MG/ML IV SOLN: INTRAVENOUS | Status: DC

## 2015-07-11 MED ORDER — SODIUM CHLORIDE 0.9 % IJ SOLN: 9.0000 mL | INTRAMUSCULAR | Status: DC | PRN

## 2015-07-11 MED ORDER — ARIPIPRAZOLE 5 MG PO TABS
5.0000 mg | ORAL_TABLET | Freq: Every day | ORAL | Status: DC
  Administered 2015-07-12: 5 mg via ORAL
  Filled 2015-07-11 (×2): qty 1

## 2015-07-11 MED ORDER — GLYCOPYRROLATE 0.2 MG/ML IJ SOLN
INTRAMUSCULAR | Status: AC
  Filled 2015-07-11: qty 4

## 2015-07-11 MED ORDER — LIDOCAINE HCL (CARDIAC) 20 MG/ML IV SOLN
INTRAVENOUS | Status: DC | PRN
  Administered 2015-07-11: 100 mg via INTRAVENOUS

## 2015-07-11 MED ORDER — MIDAZOLAM HCL 5 MG/5ML IJ SOLN
INTRAMUSCULAR | Status: DC | PRN
  Administered 2015-07-11: 2 mg via INTRAVENOUS

## 2015-07-11 MED ORDER — KETOROLAC TROMETHAMINE 30 MG/ML IJ SOLN
INTRAMUSCULAR | Status: AC
  Filled 2015-07-11: qty 1

## 2015-07-11 MED ORDER — FENTANYL CITRATE (PF) 250 MCG/5ML IJ SOLN
INTRAMUSCULAR | Status: AC
  Filled 2015-07-11: qty 5

## 2015-07-11 MED ORDER — LIDOCAINE HCL (CARDIAC) 20 MG/ML IV SOLN
INTRAVENOUS | Status: AC
  Filled 2015-07-11: qty 5

## 2015-07-11 MED ORDER — HYDROMORPHONE HCL 1 MG/ML IJ SOLN
INTRAMUSCULAR | Status: AC
  Filled 2015-07-11: qty 1

## 2015-07-11 MED ORDER — FENTANYL CITRATE (PF) 100 MCG/2ML IJ SOLN
INTRAMUSCULAR | Status: DC | PRN
  Administered 2015-07-11: 100 ug via INTRAVENOUS
  Administered 2015-07-11: 50 ug via INTRAVENOUS
  Administered 2015-07-11: 100 ug via INTRAVENOUS

## 2015-07-11 MED ORDER — HYDROMORPHONE 1 MG/ML IV SOLN
INTRAVENOUS | Status: DC
  Administered 2015-07-11: 11:00:00 via INTRAVENOUS
  Filled 2015-07-11: qty 25

## 2015-07-11 MED ORDER — ONDANSETRON HCL 4 MG PO TABS
4.0000 mg | ORAL_TABLET | Freq: Four times a day (QID) | ORAL | Status: DC | PRN
Start: 2015-07-11 — End: 2015-07-11

## 2015-07-11 MED ORDER — HYDROMORPHONE 1 MG/ML IV SOLN
INTRAVENOUS | Status: DC
  Administered 2015-07-11: 1.8 mg via INTRAVENOUS

## 2015-07-11 MED ORDER — LACTATED RINGERS IV SOLN
INTRAVENOUS | Status: DC
  Administered 2015-07-11 (×3): via INTRAVENOUS

## 2015-07-11 MED ORDER — OXYCODONE-ACETAMINOPHEN 5-325 MG PO TABS
1.0000 | ORAL_TABLET | ORAL | Status: DC | PRN
  Administered 2015-07-11 – 2015-07-12 (×3): 1 via ORAL
  Filled 2015-07-11 (×3): qty 1

## 2015-07-11 MED ORDER — HYDROCHLOROTHIAZIDE 25 MG PO TABS: 25.0000 mg | ORAL_TABLET | Freq: Every day | ORAL | Status: DC

## 2015-07-11 MED ORDER — NEOSTIGMINE METHYLSULFATE 10 MG/10ML IV SOLN
INTRAVENOUS | Status: AC
  Filled 2015-07-11: qty 1

## 2015-07-11 MED ORDER — ROCURONIUM BROMIDE 100 MG/10ML IV SOLN
INTRAVENOUS | Status: DC | PRN
  Administered 2015-07-11: 50 mg via INTRAVENOUS

## 2015-07-11 MED ORDER — HYDROMORPHONE HCL 1 MG/ML IJ SOLN
INTRAMUSCULAR | Status: DC | PRN
  Administered 2015-07-11 (×2): 1 mg via INTRAVENOUS

## 2015-07-11 MED ORDER — DEXAMETHASONE SODIUM PHOSPHATE 10 MG/ML IJ SOLN
INTRAMUSCULAR | Status: DC | PRN
  Administered 2015-07-11: 10 mg via INTRAVENOUS

## 2015-07-11 MED ORDER — DEXAMETHASONE SODIUM PHOSPHATE 10 MG/ML IJ SOLN
INTRAMUSCULAR | Status: AC
  Filled 2015-07-11: qty 1

## 2015-07-11 MED ORDER — HYDROMORPHONE HCL 1 MG/ML IJ SOLN
INTRAMUSCULAR | Status: AC
Start: 2015-07-11 — End: 2015-07-11
  Filled 2015-07-11: qty 1

## 2015-07-11 MED ORDER — SCOPOLAMINE 1 MG/3DAYS TD PT72
MEDICATED_PATCH | TRANSDERMAL | Status: AC
  Administered 2015-07-11: 1.5 mg via TRANSDERMAL
  Filled 2015-07-11: qty 1

## 2015-07-11 MED ORDER — MENTHOL 3 MG MT LOZG
1.0000 | LOZENGE | OROMUCOSAL | Status: DC | PRN
Start: 2015-07-11 — End: 2015-07-12

## 2015-07-11 MED ORDER — GLYCOPYRROLATE 0.2 MG/ML IJ SOLN
INTRAMUSCULAR | Status: DC | PRN
  Administered 2015-07-11: .8 mg via INTRAVENOUS

## 2015-07-11 MED ORDER — ONDANSETRON HCL 4 MG PO TABS: 4.0000 mg | ORAL_TABLET | Freq: Four times a day (QID) | ORAL | Status: DC | PRN

## 2015-07-11 MED ORDER — ONDANSETRON HCL 4 MG/2ML IJ SOLN
INTRAMUSCULAR | Status: DC | PRN
  Administered 2015-07-11: 4 mg via INTRAVENOUS

## 2015-07-11 MED ORDER — NEOSTIGMINE METHYLSULFATE 10 MG/10ML IV SOLN
INTRAVENOUS | Status: DC | PRN
  Administered 2015-07-11: 5 mg via INTRAVENOUS

## 2015-07-11 SURGICAL SUPPLY — 33 items
CANISTER SUCT 3000ML (MISCELLANEOUS) ×3 IMPLANT
CLOTH BEACON ORANGE TIMEOUT ST (SAFETY) ×3 IMPLANT
CONT PATH 16OZ SNAP LID 3702 (MISCELLANEOUS) ×3 IMPLANT
DECANTER SPIKE VIAL GLASS SM (MISCELLANEOUS) IMPLANT
DRAPE CESAREAN BIRTH W POUCH (DRAPES) ×3 IMPLANT
DRAPE WARM FLUID 44X44 (DRAPE) ×3 IMPLANT
DRSG OPSITE POSTOP 4X10 (GAUZE/BANDAGES/DRESSINGS) ×3 IMPLANT
DURAPREP 26ML APPLICATOR (WOUND CARE) ×3 IMPLANT
ELECT LIGASURE SHORT 9 REUSE (ELECTRODE) ×3 IMPLANT
GAUZE SPONGE 4X4 16PLY XRAY LF (GAUZE/BANDAGES/DRESSINGS) IMPLANT
GLOVE BIO SURGEON STRL SZ 6.5 (GLOVE) ×3 IMPLANT
GLOVE BIOGEL PI IND STRL 7.0 (GLOVE) ×8 IMPLANT
GLOVE BIOGEL PI INDICATOR 7.0 (GLOVE) ×4
GOWN STRL REUS W/TWL LRG LVL3 (GOWN DISPOSABLE) ×9 IMPLANT
HEMOSTAT SURGICEL 4X8 (HEMOSTASIS) IMPLANT
NS IRRIG 1000ML POUR BTL (IV SOLUTION) ×3 IMPLANT
PACK ABDOMINAL GYN (CUSTOM PROCEDURE TRAY) ×3 IMPLANT
PAD OB MATERNITY 4.3X12.25 (PERSONAL CARE ITEMS) ×3 IMPLANT
PENCIL SMOKE EVAC W/HOLSTER (ELECTROSURGICAL) ×3 IMPLANT
SPONGE LAP 18X18 X RAY DECT (DISPOSABLE) ×6 IMPLANT
STAPLER VISISTAT 35W (STAPLE) IMPLANT
SUT MNCRL 0 MO-4 VIOLET 18 CR (SUTURE) ×6 IMPLANT
SUT MNCRL 0 VIOLET 6X18 (SUTURE) ×2 IMPLANT
SUT MON AB-0 CT1 36 (SUTURE) ×3 IMPLANT
SUT MONOCRYL 0 6X18 (SUTURE) ×1
SUT MONOCRYL 0 MO 4 18  CR/8 (SUTURE) ×3
SUT PDS AB 0 CTX 60 (SUTURE) ×3 IMPLANT
SUT PLAIN 2 0 XLH (SUTURE) ×3 IMPLANT
SUT VIC AB 3-0 X1 27 (SUTURE) IMPLANT
SUT VIC AB 4-0 KS 27 (SUTURE) IMPLANT
TOWEL OR 17X24 6PK STRL BLUE (TOWEL DISPOSABLE) ×6 IMPLANT
TRAY FOLEY CATH SILVER 14FR (SET/KITS/TRAYS/PACK) ×3 IMPLANT
WATER STERILE IRR 1000ML POUR (IV SOLUTION) ×3 IMPLANT

## 2015-07-11 NOTE — Anesthesia Preprocedure Evaluation (Addendum)
Anesthesia Evaluation  Patient identified by MRN, date of birth, ID band Patient awake    Reviewed: Allergy & Precautions, H&P , NPO status , Patient's Chart, lab work & pertinent test results, reviewed documented beta blocker date and time   Airway Mallampati: I  TM Distance: >3 FB Neck ROM: full    Dental no notable dental hx. (+) Teeth Intact   Pulmonary Current Smoker,    breath sounds clear to auscultation       Cardiovascular negative cardio ROS Normal cardiovascular exam     Neuro/Psych negative neurological ROS  negative psych ROS   GI/Hepatic Neg liver ROS,   Endo/Other  Morbid obesity  Renal/GU negative Renal ROS     Musculoskeletal   Abdominal (+) + obese,   Peds  Hematology negative hematology ROS (+)   Anesthesia Other Findings   Reproductive/Obstetrics negative OB ROS                            Anesthesia Physical Anesthesia Plan  ASA: III  Anesthesia Plan: General   Post-op Pain Management:    Induction: Intravenous  Airway Management Planned: Oral ETT  Additional Equipment:   Intra-op Plan:   Post-operative Plan: Extubation in OR  Informed Consent: I have reviewed the patients History and Physical, chart, labs and discussed the procedure including the risks, benefits and alternatives for the proposed anesthesia with the patient or authorized representative who has indicated his/her understanding and acceptance.   Dental Advisory Given  Plan Discussed with: CRNA and Surgeon  Anesthesia Plan Comments:         Anesthesia Quick Evaluation

## 2015-07-11 NOTE — Transfer of Care (Signed)
Immediate Anesthesia Transfer of Care Note  Patient: Lori Montgomery  Procedure(s) Performed: Procedure(s): HYSTERECTOMY ABDOMINAL (N/A) BILATERAL SALPINGECTOMY  Patient Location: PACU  Anesthesia Type:General  Level of Consciousness: awake, alert  and oriented  Airway & Oxygen Therapy: Patient Spontanous Breathing and Patient connected to nasal cannula oxygen  Post-op Assessment: Report given to RN and Post -op Vital signs reviewed and stable  Post vital signs: Reviewed and stable  Last Vitals:  Filed Vitals:   07/11/15 0602  BP: 130/93  Pulse: 94  Temp: 36.7 C  Resp: 20    Complications: No apparent anesthesia complications

## 2015-07-11 NOTE — Anesthesia Postprocedure Evaluation (Signed)
Anesthesia Post Note  Patient: Lori Montgomery  Procedure(s) Performed: Procedure(s) (LRB): HYSTERECTOMY ABDOMINAL (N/A) BILATERAL SALPINGECTOMY  Patient location during evaluation: PACU Anesthesia Type: General Level of consciousness: sedated Pain management: pain level controlled Vital Signs Assessment: post-procedure vital signs reviewed and stable Respiratory status: spontaneous breathing Cardiovascular status: stable Postop Assessment: no signs of nausea or vomiting Anesthetic complications: no    Last Vitals:  Filed Vitals:   07/11/15 1030 07/11/15 1047  BP: 123/68 120/63  Pulse: 95 94  Temp:  36.3 C  Resp: 9 12    Last Pain:  Filed Vitals:   07/11/15 1054  PainSc: [place]

## 2015-07-11 NOTE — Progress Notes (Signed)
Day of Surgery Procedure(s) (LRB): HYSTERECTOMY ABDOMINAL (N/A) BILATERAL SALPINGECTOMY  Subjective: Patient reports incisional pain and tolerating PO.    Objective: I have reviewed patient's vital signs, intake and output and medications.  General: alert and cooperative GI: normal findings: soft, non-tender Extremities: extremities normal, atraumatic, no cyanosis or edema Vaginal Bleeding: none  Assessment: s/p Procedure(s): HYSTERECTOMY ABDOMINAL (N/A) BILATERAL SALPINGECTOMY: stable  Plan: continue postop care  LOS: 0 days    Lori Montgomery 07/11/2015, 1:21 PM

## 2015-07-11 NOTE — Addendum Note (Signed)
Addendum  created 07/11/15 1614 by Georgeanne Nim, CRNA   Modules edited: Clinical Notes   Clinical Notes:  File: [id number]

## 2015-07-11 NOTE — Anesthesia Procedure Notes (Signed)
Procedure Name: Intubation Date/Time: 07/11/2015 7:27 AM Performed by: Riki Sheer Pre-anesthesia Checklist: Patient identified, Emergency Drugs available, Suction available, Patient being monitored and Timeout performed Patient Re-evaluated:Patient Re-evaluated prior to inductionOxygen Delivery Method: Circle system utilized Preoxygenation: Pre-oxygenation with 100% oxygen Intubation Type: IV induction Ventilation: Mask ventilation without difficulty Laryngoscope Size: Miller and 2 Grade View: Grade I Tube type: Oral Tube size: 7.0 mm Number of attempts: 1 Airway Equipment and Method: Stylet Placement Confirmation: ETT inserted through vocal cords under direct vision,  positive ETCO2,  CO2 detector and breath sounds checked- equal and bilateral Secured at: 22 cm Tube secured with: Tape Dental Injury: Teeth and Oropharynx as per pre-operative assessment

## 2015-07-11 NOTE — Anesthesia Postprocedure Evaluation (Addendum)
Anesthesia Post Note  Patient: Lori Montgomery  Procedure(s) Performed: Procedure(s) (LRB): HYSTERECTOMY ABDOMINAL (N/A) BILATERAL SALPINGECTOMY  Patient location during evaluation: [facility] Anesthesia Type: General Level of consciousness: awake and alert Pain management: pain level controlled Vital Signs Assessment: post-procedure vital signs reviewed and stable Respiratory status: spontaneous breathing Cardiovascular status: stable Postop Assessment: adequate PO intake and no signs of nausea or vomiting Anesthetic complications: no    Last Vitals:  Filed Vitals:   07/11/15 1248 07/11/15 1350  BP: 120/69 120/69  Pulse: 95 87  Temp: 36.4 C 36.4 C  Resp: 17 18    Last Pain:  Filed Vitals:   07/11/15 1350  PainSc: 2                  Everette Rank

## 2015-07-11 NOTE — Addendum Note (Signed)
Addendum  created 07/11/15 1650 by Georgeanne Nim, CRNA   Modules edited: Clinical Notes   Clinical Notes:  File: [id number]

## 2015-07-12 ENCOUNTER — Encounter (HOSPITAL_COMMUNITY): Payer: Self-pay | Admitting: Obstetrics and Gynecology

## 2015-07-12 LAB — CBC
HEMATOCRIT: 32.8 % — AB (ref 36.0–46.0)
HEMOGLOBIN: 10.5 g/dL — AB (ref 12.0–15.0)
MCH: 28.3 pg (ref 26.0–34.0)
MCHC: 32 g/dL (ref 30.0–36.0)
MCV: 88.4 fL (ref 78.0–100.0)
Platelets: 264 10*3/uL (ref 150–400)
RBC: 3.71 MIL/uL — ABNORMAL LOW (ref 3.87–5.11)
RDW: 13.9 % (ref 11.5–15.5)
WBC: 12.5 10*3/uL — ABNORMAL HIGH (ref 4.0–10.5)

## 2015-07-12 MED ORDER — OXYCODONE-ACETAMINOPHEN 5-325 MG PO TABS: 1.0000 | ORAL_TABLET | ORAL | Status: DC | PRN

## 2015-07-12 MED ORDER — IBUPROFEN 600 MG PO TABS: 600.0000 mg | ORAL_TABLET | Freq: Four times a day (QID) | ORAL | Status: DC | PRN

## 2015-07-12 NOTE — Discharge Instructions (Signed)
Abdominal Hysterectomy  Abdominal hysterectomy is a surgical procedure to remove your womb (uterus). Your uterus is the muscular organ that contains a developing baby. This surgery is done for many reasons. You may need an abdominal hysterectomy if you have cancer, growths (tumors), long-term pain, or bleeding. You may also have this procedure if your uterus has slipped down into your vagina (uterine prolapse).  Depending on why you need an abdominal hysterectomy, you may also have other reproductive organs removed. These could include the part of your vagina that connects with your uterus (cervix), the organs that make eggs (ovaries), and the tubes that connect the ovaries to the uterus (fallopian tubes).  LET YOUR HEALTH CARE PROVIDER KNOW ABOUT:    Any allergies you have.   All medicines you are taking, including vitamins, herbs, eye drops, creams, and over-the-counter medicines.   Previous problems you or members of your family have had with the use of anesthetics.   Any blood disorders you have.   Previous surgeries you have had.   Medical conditions you have.  RISKS AND COMPLICATIONS  Generally, this is a safe procedure. However, as with any procedure, problems can occur. Infection is the most common problem after an abdominal hysterectomy. Other possible problems include:   Bleeding.   Formation of blood clots that may break free and travel to your lungs.   Injury to other organs near your uterus.   Nerve injury causing nerve pain.   Decreased interest in sex or pain during sexual intercourse.  BEFORE THE PROCEDURE   Abdominal hysterectomy is a major surgical procedure. It can affect the way you feel about yourself. Talk to your health care provider about the physical and emotional changes hysterectomy may cause.   You may need to have blood work and X-rays done before surgery.   Quit smoking if you smoke. Ask your health care provider for help if you are struggling to quit.   Stop taking  medicines that thin your blood as directed by your health care provider.   You may be instructed to take antibiotic medicines or laxatives before surgery.   Do not eat or drink anything for 6-8 hours before surgery.   Take your regular medicines with a small sip of water.   Bathe or shower the night or morning before surgery.  PROCEDURE   Abdominal hysterectomy is done in the operating room at the hospital.   In most cases, you will be given a medicine that makes you go to sleep (general anesthetic).   The surgeon will make a cut (incision) through the skin in your lower belly.   The incision may be about 5-7 inches long. It may go side-to-side or up-and-down.   The surgeon will move aside the body tissue that covers your uterus. The surgeon will then carefully take out your uterus along with any of your other reproductive organs that need to be removed.   Bleeding will be controlled with clamps or sutures.   The surgeon will close your incision with sutures or metal clips.  AFTER THE PROCEDURE   You will have some pain immediately after the procedure.   You will be given pain medicine in the recovery room.   You will be taken to your hospital room when you have recovered from the anesthesia.   You may need to stay in the hospital for 2-5 days.   You will be given instructions for recovery at home.     This information is not intended 

## 2015-07-12 NOTE — Discharge Summary (Signed)
Physician Discharge Summary  Patient ID: Lori Montgomery MRN: [id number] DOB/AGE: 12-Oct-1970 44 y.o.  Admit date: 07/11/2015 Discharge date: 07/12/2015  Admission Diagnoses:  Fibroids, menorrhagia  Discharge Diagnoses:  Active Problems:   Fibroids   Fibroid   Discharged Condition: stable  Hospital Course: pt admitted for postop care.  Initially pain was controlled with IVPCA & foley catheter in place.  POD#1, pt was ambulating and voiding without difficulty.  Pain was controlled with PO meds and she requested early discharge.  Vitals and labs normal.  Tolerating diet and passing flatus.    Consults: None  Significant Diagnostic Studies: labs: cbc  Treatments: surgery: TAH/BS  Discharge Exam: Blood pressure 102/68, pulse 70, temperature 98.2 F (36.8 C), temperature source Oral, resp. rate 18, height 5\' 7"  (1.702 m), weight 270 lb (122.471 kg), SpO2 95 %. General appearance: alert and cooperative GI: normal findings: soft, non-tender Incision/Wound:clean and dry  Disposition: 01-Home or Self Care     Medication List    TAKE these medications        ARIPiprazole 5 MG tablet  Commonly known as:  ABILIFY  Take 5 mg by mouth daily.     EFFEXOR XR 75 MG 24 hr capsule  Generic drug:  venlafaxine XR  Take 225 mg by mouth daily with breakfast.     hydrochlorothiazide 25 MG tablet  Commonly known as:  HYDRODIURIL  Take 25 mg by mouth daily.     ibuprofen 600 MG tablet  Commonly known as:  ADVIL,MOTRIN  Take 1 tablet (600 mg total) by mouth every 6 (six) hours as needed.     losartan 100 MG tablet  Commonly known as:  COZAAR  Take 100 mg by mouth daily.     oxyCODONE-acetaminophen 5-325 MG tablet  Commonly known as:  PERCOCET/ROXICET  Take 1-2 tablets by mouth every 4 (four) hours as needed for severe pain (moderate to severe pain (when tolerating fluids)).           Follow-up Information    Schedule an appointment as soon as possible for a visit in 2 weeks to  follow up.      Signed: Sayra Frisby 07/12/2015, 8:26 AM

## 2015-07-12 NOTE — Progress Notes (Signed)
Pt  Ambulated out teaching complete  

## 2015-07-12 NOTE — Op Note (Signed)
NAME:  JARROD, Lori Montgomery               ACCOUNT NO.:  [id number]  MEDICAL RECORD NO.:  [id number]  LOCATION:  [id number]                          FACILITY:  [facility]  PHYSICIAN:  Marylynn Pearson, MD    DATE OF BIRTH:  1971/07/05  DATE OF PROCEDURE:  07/11/2015 DATE OF DISCHARGE:                              OPERATIVE REPORT   PREOPERATIVE DIAGNOSES: 1. Fibroid. 2. Menorrhagia.  POSTOPERATIVE DIAGNOSES: 1. Fibroid. 2. Menorrhagia.  PROCEDURE:  Total abdominal hysterectomy with bilateral salpingectomy and uterosacral plication.  SURGEON:  Marylynn Pearson, MD.  Terrence DupontCorinna Capra.  ESTIMATED BLOOD LOSS:  100 mL.  URINE OUTPUT:  Clear.  COMPLICATIONS:  None.  SPECIMEN:  Uterus, cervix, bilateral fallopian tubes.  CONDITION:  Stable and extubated to recovery room.  PROCEDURE IN DETAIL:  The patient was taken to the operating room, where general anesthesia was performed.  She was placed in the supine position.  Prepped and draped in sterile fashion and a Foley catheter was inserted.  Pfannenstiel skin incision was made with a scalpel and extended to the level of the fascia.  The fascia was incised in the midline and extended laterally.  Peritoneum was identified and entered sharply.  This was extended with good visualization of the bladder.  The O'Connor-O'Sullivan retractor was placed in the abdomen.  The bowel was packed with moist lap sponges, and the uterus was grasped with Kelly clamps.  Round ligaments were grasped, cauterized, and cut bilaterally and suture ligated, excellent hemostasis was noted.  The fallopian tubes were grasped and tented upwards.  The LigaSure device was used to cauterize the mesosalpinx and excised with Mayo scissors.  On the right side, Kelly clamp and Monocryl suture were used.  The uterine arteries were then skeletonized using the Bovie and the vesicouterine peritoneum was dissected off the lower uterine segment and cervix using sharp and blunt dissection.   Uterine arteries were then clamped bilaterally, cut, and suture ligated.  Excellent hemostasis was noted.  Uterosacral ligaments were clamped, cut, and suture ligated bilaterally.  The uterus was then amputated and the vagina was closed using a series of figure-of- eight sutures.  The pelvis was then copiously irrigated, and all pedicles were reinspected, any small bleeders were cauterized with the Bovie or suture ligated.  The pelvis was then re-irrigated and found to be hemostatic.  Uterosacral ligament plication was then performed.  The peritoneum was closed with Monocryl.  The fascia was closed with PDS.  A deep stitch using plain suture was used in the subcutaneous tissue, and the skin was closed with Vicryl.  Skin glue was placed over the incision and sponge, lap, needle, and instrument counts were correct x2.  She was extubated and taken to the recovery room in stable condition.     Marylynn Pearson, MD     GA/MEDQ  D:  07/11/2015  T:  07/12/2015  Job:  [id number]

## 2015-12-16 ENCOUNTER — Other Ambulatory Visit: Payer: Self-pay | Admitting: Family Medicine

## 2015-12-27 ENCOUNTER — Ambulatory Visit: Payer: 59 | Admitting: Internal Medicine

## 2016-01-07 ENCOUNTER — Ambulatory Visit: Payer: 59 | Admitting: Internal Medicine

## 2016-01-07 ENCOUNTER — Encounter: Payer: Self-pay | Admitting: *Deleted

## 2016-04-29 ENCOUNTER — Other Ambulatory Visit: Payer: Self-pay

## 2016-04-29 DIAGNOSIS — N644 Mastodynia: Secondary | ICD-10-CM

## 2016-05-02 ENCOUNTER — Other Ambulatory Visit: Payer: Self-pay | Admitting: Family Medicine

## 2016-05-02 DIAGNOSIS — N644 Mastodynia: Secondary | ICD-10-CM

## 2016-05-06 ENCOUNTER — Ambulatory Visit
Admission: RE | Admit: 2016-05-06 | Discharge: 2016-05-06 | Disposition: A | Discharge status: Home / Self Care | Payer: 59 | Source: Ambulatory Visit | Attending: Family Medicine | Admitting: Family Medicine

## 2016-05-06 DIAGNOSIS — N644 Mastodynia: Secondary | ICD-10-CM

## 2016-05-13 ENCOUNTER — Encounter (HOSPITAL_COMMUNITY): Payer: Self-pay

## 2016-09-01 DIAGNOSIS — S91312A Laceration without foreign body, left foot, initial encounter: Secondary | ICD-10-CM | POA: Diagnosis not present

## 2016-11-18 ENCOUNTER — Other Ambulatory Visit: Payer: Self-pay | Admitting: Internal Medicine

## 2016-11-18 NOTE — Telephone Encounter (Signed)
Dr Jordan pt 

## 2016-12-02 DIAGNOSIS — R0982 Postnasal drip: Secondary | ICD-10-CM | POA: Diagnosis not present

## 2016-12-02 DIAGNOSIS — Z720 Tobacco use: Secondary | ICD-10-CM | POA: Diagnosis not present

## 2017-02-11 DIAGNOSIS — I10 Essential (primary) hypertension: Secondary | ICD-10-CM | POA: Diagnosis not present

## 2017-02-11 DIAGNOSIS — R609 Edema, unspecified: Secondary | ICD-10-CM | POA: Diagnosis not present

## 2017-02-15 DIAGNOSIS — G4700 Insomnia, unspecified: Secondary | ICD-10-CM | POA: Diagnosis not present

## 2017-03-30 DIAGNOSIS — I10 Essential (primary) hypertension: Secondary | ICD-10-CM | POA: Diagnosis not present

## 2017-04-21 DIAGNOSIS — N951 Menopausal and female climacteric states: Secondary | ICD-10-CM | POA: Diagnosis not present

## 2017-04-22 DIAGNOSIS — N958 Other specified menopausal and perimenopausal disorders: Secondary | ICD-10-CM | POA: Diagnosis not present

## 2017-04-22 DIAGNOSIS — I10 Essential (primary) hypertension: Secondary | ICD-10-CM | POA: Diagnosis not present

## 2017-04-22 DIAGNOSIS — R7303 Prediabetes: Secondary | ICD-10-CM | POA: Diagnosis not present

## 2017-04-23 DIAGNOSIS — I10 Essential (primary) hypertension: Secondary | ICD-10-CM | POA: Diagnosis not present

## 2017-04-23 DIAGNOSIS — R7303 Prediabetes: Secondary | ICD-10-CM | POA: Diagnosis not present

## 2017-04-29 ENCOUNTER — Encounter: Payer: Self-pay | Admitting: Family Medicine

## 2017-04-29 DIAGNOSIS — I10 Essential (primary) hypertension: Secondary | ICD-10-CM | POA: Diagnosis not present

## 2017-04-29 DIAGNOSIS — R7303 Prediabetes: Secondary | ICD-10-CM | POA: Diagnosis not present

## 2017-05-14 DIAGNOSIS — K130 Diseases of lips: Secondary | ICD-10-CM | POA: Diagnosis not present

## 2017-05-14 DIAGNOSIS — Z23 Encounter for immunization: Secondary | ICD-10-CM | POA: Diagnosis not present

## 2017-08-25 DIAGNOSIS — M7061 Trochanteric bursitis, right hip: Secondary | ICD-10-CM | POA: Diagnosis not present

## 2017-08-25 DIAGNOSIS — M7062 Trochanteric bursitis, left hip: Secondary | ICD-10-CM | POA: Diagnosis not present

## 2017-09-02 DIAGNOSIS — R079 Chest pain, unspecified: Secondary | ICD-10-CM | POA: Diagnosis not present

## 2017-09-02 DIAGNOSIS — K219 Gastro-esophageal reflux disease without esophagitis: Secondary | ICD-10-CM | POA: Diagnosis not present

## 2017-09-13 ENCOUNTER — Ambulatory Visit: Payer: 59 | Admitting: Cardiology

## 2017-09-14 DIAGNOSIS — D170 Benign lipomatous neoplasm of skin and subcutaneous tissue of head, face and neck: Secondary | ICD-10-CM | POA: Diagnosis not present

## 2017-09-15 ENCOUNTER — Other Ambulatory Visit: Payer: Self-pay | Admitting: Physician Assistant

## 2017-09-15 DIAGNOSIS — D179 Benign lipomatous neoplasm, unspecified: Secondary | ICD-10-CM

## 2017-09-21 ENCOUNTER — Other Ambulatory Visit: Payer: 59

## 2017-09-27 ENCOUNTER — Encounter (HOSPITAL_COMMUNITY): Payer: Self-pay

## 2017-12-14 ENCOUNTER — Other Ambulatory Visit: Payer: Self-pay | Admitting: Radiology

## 2017-12-14 DIAGNOSIS — N644 Mastodynia: Secondary | ICD-10-CM

## 2017-12-15 ENCOUNTER — Other Ambulatory Visit: Payer: 59

## 2018-02-08 DIAGNOSIS — R232 Flushing: Secondary | ICD-10-CM | POA: Diagnosis not present

## 2018-02-08 DIAGNOSIS — K219 Gastro-esophageal reflux disease without esophagitis: Secondary | ICD-10-CM | POA: Diagnosis not present

## 2018-03-17 ENCOUNTER — Other Ambulatory Visit: Payer: Self-pay | Admitting: Gastroenterology

## 2018-03-17 DIAGNOSIS — R072 Precordial pain: Secondary | ICD-10-CM

## 2018-03-24 ENCOUNTER — Ambulatory Visit
Admission: RE | Admit: 2018-03-24 | Discharge: 2018-03-24 | Disposition: A | Discharge status: Home / Self Care | Payer: 59 | Source: Ambulatory Visit | Attending: Gastroenterology | Admitting: Gastroenterology

## 2018-03-24 DIAGNOSIS — R072 Precordial pain: Secondary | ICD-10-CM

## 2018-05-13 ENCOUNTER — Ambulatory Visit (INDEPENDENT_AMBULATORY_CARE_PROVIDER_SITE_OTHER): Payer: Self-pay | Admitting: Orthopaedic Surgery

## 2018-05-17 DIAGNOSIS — M25569 Pain in unspecified knee: Secondary | ICD-10-CM | POA: Insufficient documentation

## 2018-09-10 IMAGING — RF DG UGI W/ HIGH DENSITY W/KUB
8 series · 14 of 24 positions shown · non-contrast
Comparison: 02/28/2014 upper GI.

CLINICAL DATA: History of gastric bypass surgery 02/27/2014.
Patient presents with substernal discomfort and intermittent
sensation of pills/food sticking in chest.

EXAM:
UPPER GI SERIES WITH KUB
TECHNIQUE: After obtaining a scout radiograph a routine upper GI series was
performed using thin and high density barium.
FLUOROSCOPY TIME:  Fluoroscopy Time:  3 minutes 42 seconds
Radiation Exposure Index (if provided by the fluoroscopic device):
327 mGy
Number of Acquired Spot Images: 8

[Series 1: one shot · 0.14mm/px · 1 of 1 slices shown (1 of 3)]
[im 1/1]
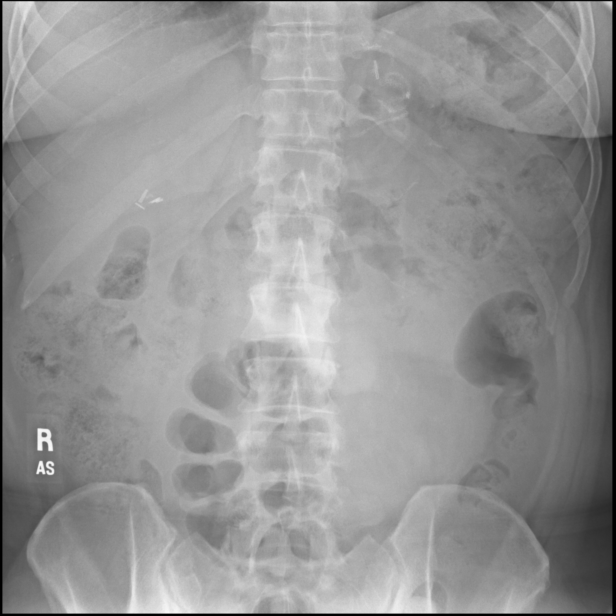

[Series 2: sequence · 1 of 40 frames shown (1 of 5)]
[frame 35/40]
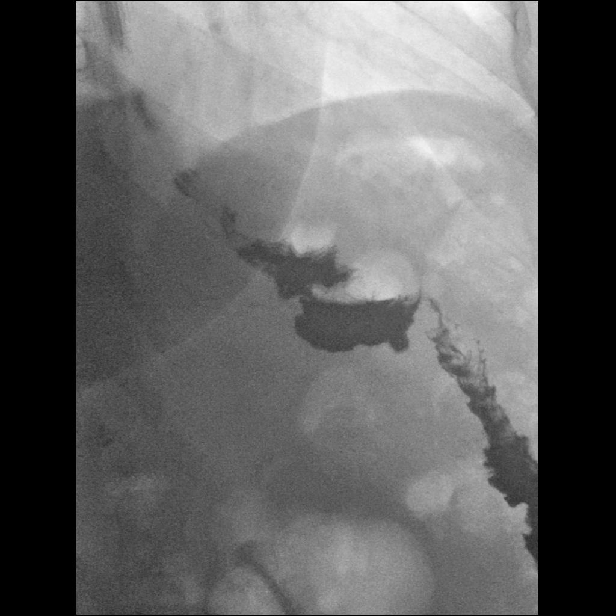

[Series 3: one shot · 0.16mm/px · 5 of 12 slices shown (2 of 3)]
[im 2/12]
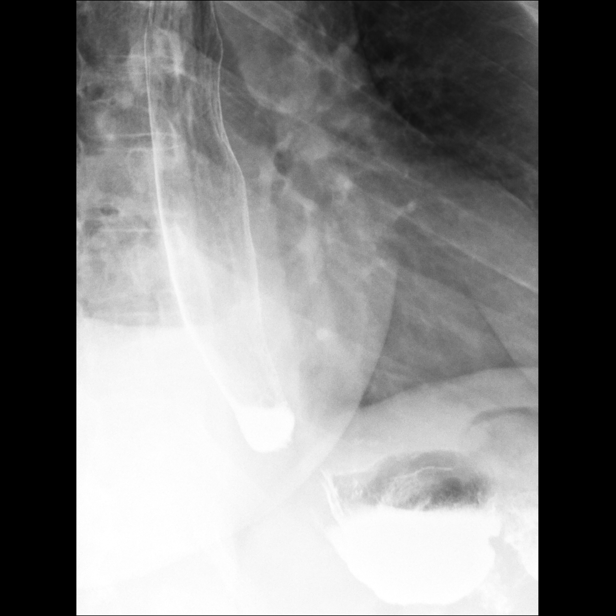
[im 5/12]
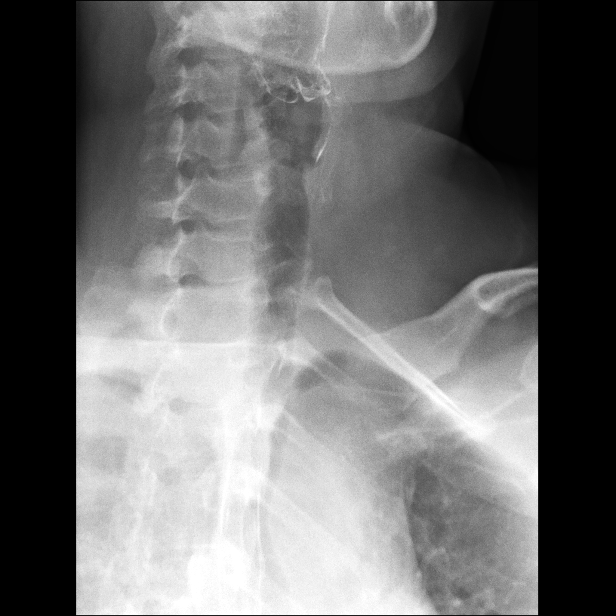
[im 6/12]
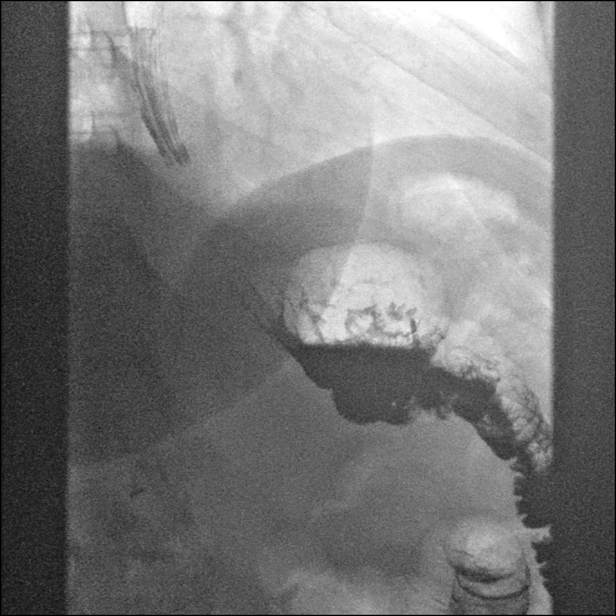
[im 9/12]
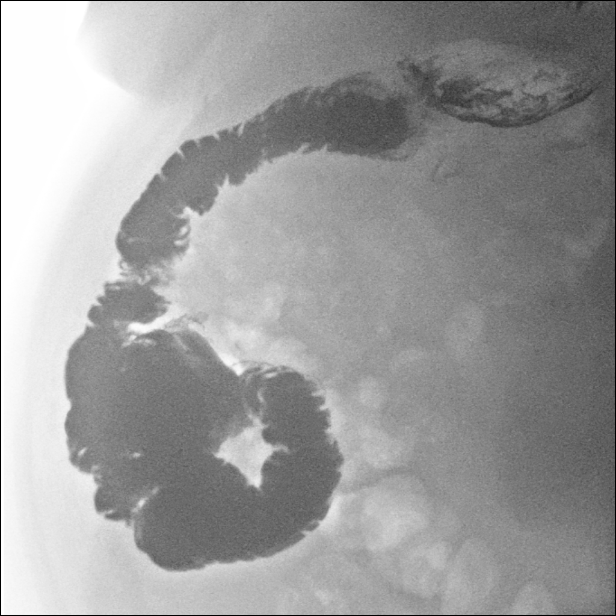
[im 12/12]
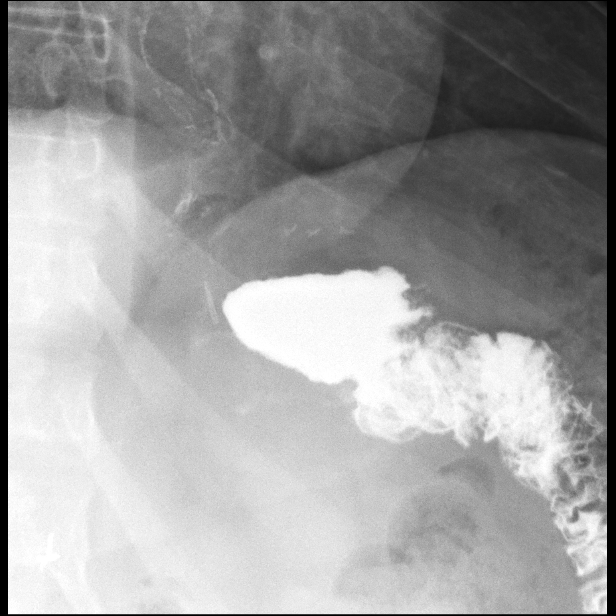

[Series 4: sequence · 2 of 44 frames shown (2 of 5)]
[frame 7/44]
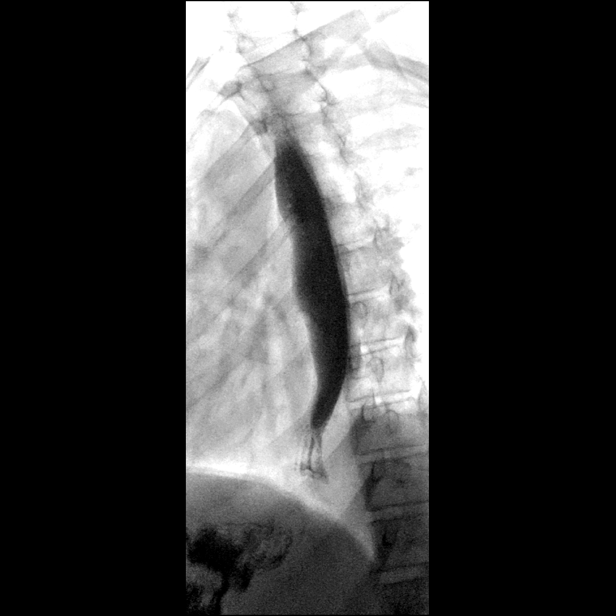
[frame 38/44]
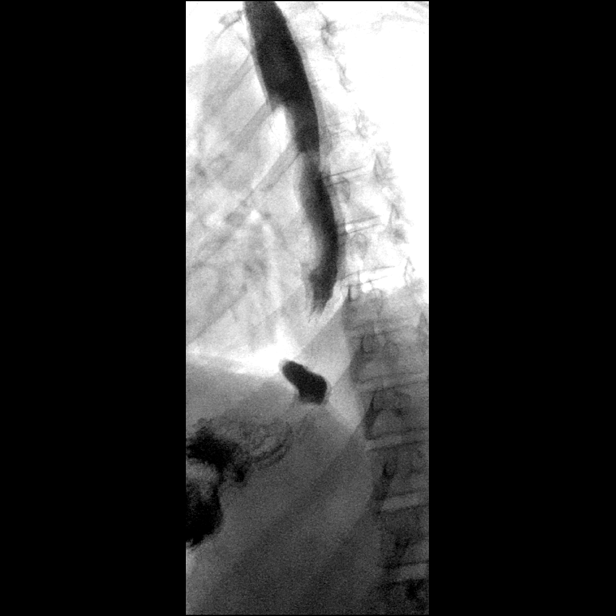

[Series 5: sequence · 1 of 113 frames shown (3 of 5)]
[frame 96/113]
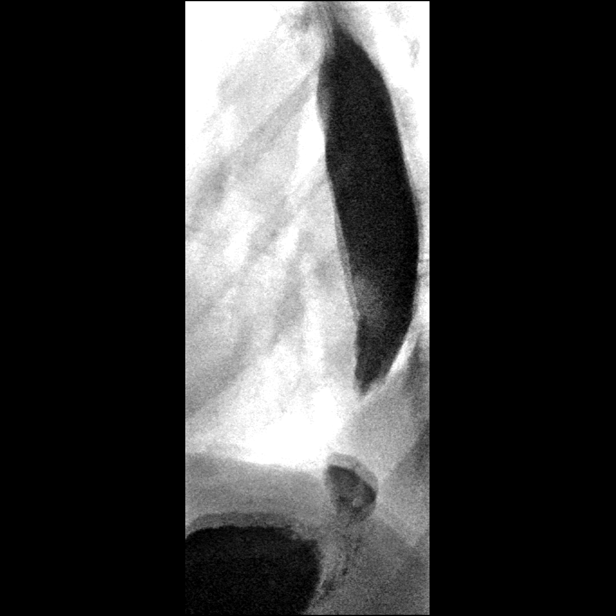

[Series 6: sequence · 2 of 9 frames shown (4 of 5)]
[frame 2/9]
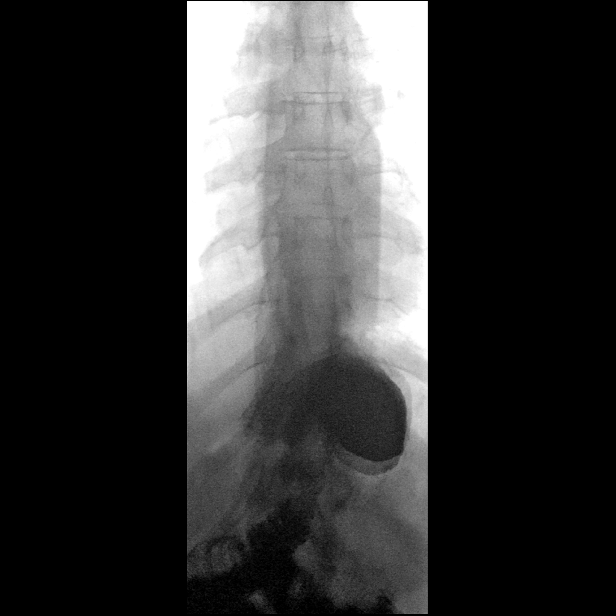
[frame 8/9  full-range]
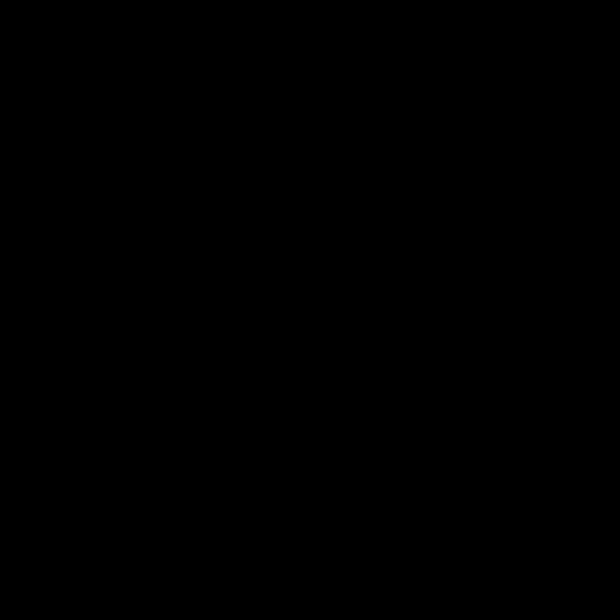

[Series 7: sequence · 1 of 66 frames shown (5 of 5)]
[frame 24/66]
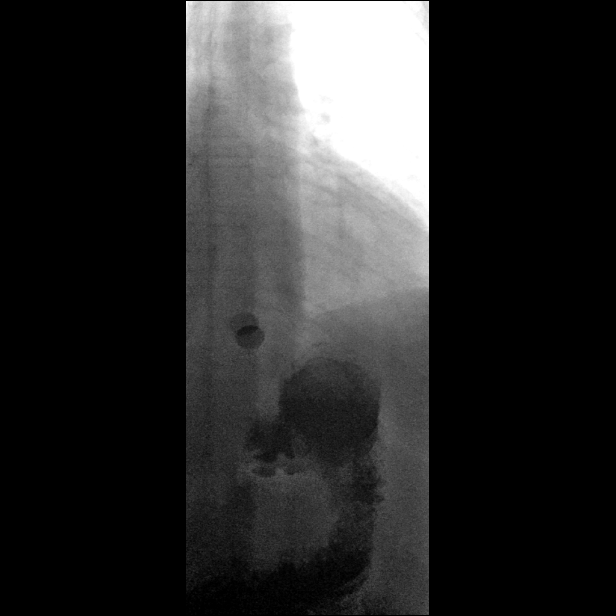

[Series 8: one shot · 1 of 1 slices shown (3 of 3)]
[im 1/1]
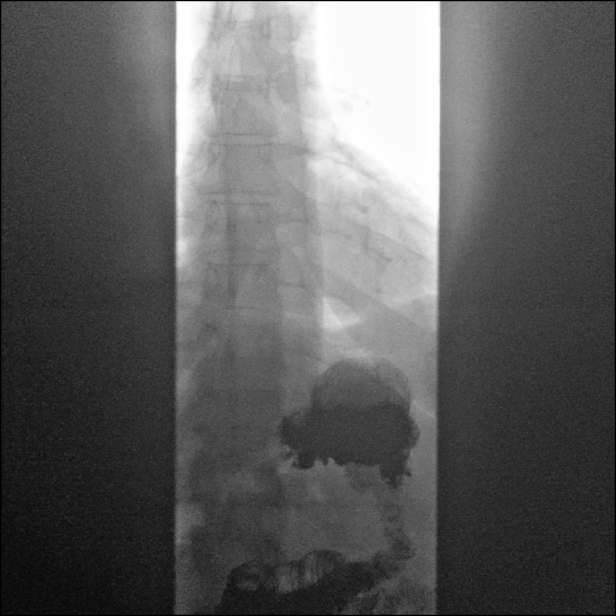

[14 of 24 positions shown; findings below may reference images not displayed]

FINDINGS: Scout radiograph demonstrates surgical sutures and clips in the left
upper quadrant from prior gastric bypass surgery and cholecystectomy
clips in the right upper quadrant. No dilated small bowel loops.
Moderate colonic stool. No evidence of pneumatosis or
pneumoperitoneum. No radiopaque nephrolithiasis.

There is mild esophageal dysmotility, characterized by intermittent
mild weakening of primary peristalsis in the mid to lower thoracic
esophagus. No hiatal hernia. Mild gastroesophageal reflux elicited
to the level of the midthoracic esophagus with water siphon test.
Normal esophageal mucosa. Normal esophageal distensibility, with no
esophageal mass, stricture or ulcer. Swallowed 13 mm barium tablet
traversed the esophagus into the stomach without significant delay.

Expected postsurgical changes from Roux-en-Y gastric bypass surgery.
Intact and widely patent gastrojejunostomy, with no anastomotic leak
or ulcer. No filling defects or ulcers in the gastric pouch.
Visualized jejunal loops are normal caliber with no fold thickening.
IMPRESSION: 1. Mild gastroesophageal reflux.  No hiatal hernia.
2. Mild esophageal dysmotility, characteristic of chronic reflux
related dysmotility.
3. Otherwise normal esophagus.
4. Expected postsurgical changes from Roux-en-Y gastric bypass
surgery, with no complication.

## 2018-11-15 LAB — LIPID PANEL
Cholesterol: 210 — AB (ref 0–200)
LDL Cholesterol: 137

## 2018-12-27 ENCOUNTER — Other Ambulatory Visit: Payer: Self-pay

## 2018-12-27 ENCOUNTER — Ambulatory Visit (INDEPENDENT_AMBULATORY_CARE_PROVIDER_SITE_OTHER): Payer: 59

## 2018-12-27 ENCOUNTER — Other Ambulatory Visit: Payer: Self-pay | Admitting: Podiatry

## 2018-12-27 ENCOUNTER — Encounter: Payer: Self-pay | Admitting: Podiatry

## 2018-12-27 ENCOUNTER — Ambulatory Visit (INDEPENDENT_AMBULATORY_CARE_PROVIDER_SITE_OTHER): Payer: 59 | Admitting: Podiatry

## 2018-12-27 VITALS — BP 105/65 | HR 70 | Temp 97.3°F | Resp 16

## 2018-12-27 DIAGNOSIS — M7661 Achilles tendinitis, right leg: Secondary | ICD-10-CM

## 2018-12-27 DIAGNOSIS — M7989 Other specified soft tissue disorders: Secondary | ICD-10-CM | POA: Diagnosis not present

## 2018-12-27 DIAGNOSIS — M79671 Pain in right foot: Secondary | ICD-10-CM

## 2018-12-27 MED ORDER — DICLOFENAC SODIUM 1 % TD GEL: 2.0000 g | Freq: Four times a day (QID) | TRANSDERMAL | 2 refills | Status: DC

## 2018-12-27 NOTE — Patient Instructions (Signed)

## 2018-12-27 NOTE — Progress Notes (Signed)
Subjective:    Patient ID: Lori Montgomery, female    DOB: 01/12/1971, 48 y.o.   MRN: [id number]  HPI 48 year old female presents the office today for concerns of pain to the back of her right heel.  She says it feels tender and she states that she just started to feel her discomfort.  She feels it there is a "knot" on the back of her heel.  She states that not wearing shoes does help as the pressure does seem to aggravate her symptoms.  No recent injury or trauma.  No swelling or redness.  No numbness or tingling.  No recent treatment.   Review of Systems  All other systems reviewed and are negative.  Past Medical History:  Diagnosis Date  . Depression   . Granuloma annulare   . H/O hiatal hernia   . Morbid obesity with BMI of 40.0-44.9, adult ([place])   . PONV (postoperative nausea and vomiting)    states also had localized reaction to IV meds during/following endoscopy 2011 requiring IV Benadryl- ? location of test    Past Surgical History:  Procedure Laterality Date  . ABDOMINAL HYSTERECTOMY N/A 07/11/2015   Procedure: HYSTERECTOMY ABDOMINAL;  Surgeon: Marylynn Pearson, MD;  Location: [facility];  Service: Gynecology;  Laterality: N/A;  . BILATERAL SALPINGECTOMY  07/11/2015   Procedure: BILATERAL SALPINGECTOMY;  Surgeon: Marylynn Pearson, MD;  Location: [facility];  Service: Gynecology;;  . BREAST REDUCTION SURGERY  2008   with scar revision  . BREAST SURGERY    . DIAGNOSTIC LAPAROSCOPY    . DILATION AND CURETTAGE OF UTERUS    . GASTRIC ROUX-EN-Y N/A 02/27/2014   Procedure:  LAPAROSCOPIC REMOVAL OF LAP BAND AND PORT,LAPAROSCOPIC ROUX-EN-Y GASTRIC BYPASS WITH UPPER ENDOSCOPY, ;  Surgeon: [name], MD;  Location: [facility];  Service: General;  Laterality: N/A;  . KNEE ARTHROSCOPY     right knee/ with ACL REPAIR  . LAPAROSCOPIC CHOLECYSTECTOMY  03/2010   Dr Johney Maine  . LAPAROSCOPIC GASTRIC BANDING N/A 10/04/2012   Procedure: LAPAROSCOPIC GASTRIC BANDING;  Surgeon: [name], MD,FACS;   Location: [facility];  Service: General;  Laterality: N/A;  Laparoscopic Adjustable Gastric Banding,   . MESH APPLIED TO LAP PORT N/A 10/04/2012   Procedure: MESH APPLIED TO LAP PORT;  Surgeon: [name], MD,FACS;  Location: [facility];  Service: General;  Laterality: N/A;     Current Outpatient Medications:  .  buPROPion (WELLBUTRIN XL) 300 MG 24 hr tablet, TAKE 1 TABLET BY MOUTH EVERY DAY IN THE MORNING, Disp: , Rfl:  .  furosemide (LASIX) 20 MG tablet, TAKE 1/2 1 TABLET BY MOUTH AS NEEDED, Disp: , Rfl:  .  hydrochlorothiazide (HYDRODIURIL) 25 MG tablet, Take 25 mg by mouth daily., Disp: , Rfl:  .  nebivolol (BYSTOLIC) 5 MG tablet, Bystolic 5 mg tablet, Disp: , Rfl:  .  olmesartan (BENICAR) 40 MG tablet, olmesartan 40 mg tablet, Disp: , Rfl:  .  venlafaxine XR (EFFEXOR-XR) 150 MG 24 hr capsule, venlafaxine ER 150 mg capsule,extended release 24 hr, Disp: , Rfl:  .  diclofenac sodium (VOLTAREN) 1 % GEL, Apply 2 g topically 4 (four) times daily. Rub into affected area of foot 2 to 4 times daily, Disp: 100 g, Rfl: 2  No Known Allergies      Objective:   Physical Exam General: AAO x3, NAD  Dermatological: Skin is warm, dry and supple bilateral.  There are no open sores, no preulcerative lesions, no rash or signs of  infection present.  Vascular: Dorsalis Pedis artery and Posterior Tibial artery pedal pulses are 2/4 bilateral with immedate capillary fill time.There is no pain with calf compression, swelling, warmth, erythema.   Neruologic: Grossly intact via light touch bilateral. Negative Tinel sign.  Musculoskeletal: To the posterior aspect of the right heel is what appears to be small soft tissue mass to the posterior aspect the calcaneus.  This appears to be on the posterior lateral aspect of the calcaneus.  There is continued tenderness palpation identified today that does cause irritation with pressure in shoes.  Negative Tinel sign.  No pain with Achilles tendon.  No pain with lateral  compression of calcaneus.  Gait: Unassisted, Nonantalgic.     Assessment & Plan:  48 year old female with soft tissue mass right posterior heel.  -Treatment options discussed including all alternatives, risks, and complications -Etiology of symptoms were discussed -X-rays were obtained and reviewed with the patient. Skin marker is utilized to identify the area.  This is in the posterior Healed.  No significant bone spurring is present. -Discussed stretching, ice exercises to perform daily.  Prescribed Voltaren gel that she can use topically to the area as well.  Offloading pad dispensed gel to put avoid any pressure.  If symptoms persist discussed ultrasound of the area as it did not appear to be a bony mass but small soft tissue cyst  Trula Slade DPM

## 2018-12-29 ENCOUNTER — Other Ambulatory Visit: Payer: Self-pay | Admitting: Obstetrics and Gynecology

## 2018-12-29 DIAGNOSIS — Z9189 Other specified personal risk factors, not elsewhere classified: Secondary | ICD-10-CM

## 2019-02-07 ENCOUNTER — Ambulatory Visit: Payer: 59 | Admitting: Podiatry

## 2019-04-18 ENCOUNTER — Inpatient Hospital Stay: Admission: RE | Admit: 2019-04-18 | Payer: 59 | Source: Ambulatory Visit

## 2020-06-20 ENCOUNTER — Other Ambulatory Visit: Payer: Self-pay | Admitting: Obstetrics and Gynecology

## 2020-06-20 DIAGNOSIS — Z9189 Other specified personal risk factors, not elsewhere classified: Secondary | ICD-10-CM

## 2020-09-04 ENCOUNTER — Other Ambulatory Visit: Payer: Self-pay | Admitting: Obstetrics and Gynecology

## 2020-09-04 DIAGNOSIS — Z9189 Other specified personal risk factors, not elsewhere classified: Secondary | ICD-10-CM

## 2020-10-04 ENCOUNTER — Other Ambulatory Visit: Payer: 59

## 2020-10-14 ENCOUNTER — Encounter (INDEPENDENT_AMBULATORY_CARE_PROVIDER_SITE_OTHER): Payer: Self-pay

## 2020-10-27 ENCOUNTER — Ambulatory Visit
Admission: RE | Admit: 2020-10-27 | Discharge: 2020-10-27 | Disposition: A | Discharge status: Home / Self Care | Payer: 59 | Source: Ambulatory Visit | Attending: Obstetrics and Gynecology | Admitting: Obstetrics and Gynecology

## 2020-10-27 ENCOUNTER — Other Ambulatory Visit: Payer: Self-pay

## 2020-10-27 DIAGNOSIS — Z9189 Other specified personal risk factors, not elsewhere classified: Secondary | ICD-10-CM

## 2020-10-27 MED ORDER — GADOBUTROL 1 MMOL/ML IV SOLN
10.0000 mL | Freq: Once | INTRAVENOUS | Status: AC | PRN
  Administered 2020-10-27: 10 mL via INTRAVENOUS

## 2020-11-20 ENCOUNTER — Ambulatory Visit: Payer: 59 | Admitting: Registered"

## 2021-04-16 DIAGNOSIS — Z0289 Encounter for other administrative examinations: Secondary | ICD-10-CM

## 2021-04-17 ENCOUNTER — Encounter (INDEPENDENT_AMBULATORY_CARE_PROVIDER_SITE_OTHER): Payer: Self-pay | Admitting: Family Medicine

## 2021-04-17 ENCOUNTER — Ambulatory Visit (INDEPENDENT_AMBULATORY_CARE_PROVIDER_SITE_OTHER): Payer: 59 | Admitting: Family Medicine

## 2021-04-17 ENCOUNTER — Other Ambulatory Visit: Payer: Self-pay

## 2021-04-17 VITALS — BP 118/83 | HR 73 | Temp 98.0°F | Ht 68.0 in | Wt 290.0 lb

## 2021-04-17 DIAGNOSIS — Z1331 Encounter for screening for depression: Secondary | ICD-10-CM

## 2021-04-17 DIAGNOSIS — R5383 Other fatigue: Secondary | ICD-10-CM

## 2021-04-17 DIAGNOSIS — Z9189 Other specified personal risk factors, not elsewhere classified: Secondary | ICD-10-CM

## 2021-04-17 DIAGNOSIS — E559 Vitamin D deficiency, unspecified: Secondary | ICD-10-CM | POA: Diagnosis not present

## 2021-04-17 DIAGNOSIS — Z6841 Body Mass Index (BMI) 40.0 and over, adult: Secondary | ICD-10-CM

## 2021-04-17 DIAGNOSIS — I10 Essential (primary) hypertension: Secondary | ICD-10-CM | POA: Diagnosis not present

## 2021-04-17 DIAGNOSIS — Z9884 Bariatric surgery status: Secondary | ICD-10-CM

## 2021-04-17 DIAGNOSIS — R0602 Shortness of breath: Secondary | ICD-10-CM

## 2021-04-17 DIAGNOSIS — F39 Unspecified mood [affective] disorder: Secondary | ICD-10-CM | POA: Diagnosis not present
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
## 2021-04-17 NOTE — Progress Notes (Signed)
Dear Lori Rota, NP,   Thank you for referring Lori Montgomery to our clinic. The following note includes my evaluation and treatment recommendations.  Chief Complaint:   OBESITY Lori Montgomery (MR# [id number]) is a 50 y.o. female who presents for evaluation and treatment of obesity and related comorbidities. Current BMI is Body mass index is 44.09 kg/m. Lori Montgomery has been struggling with her weight for many years and has been unsuccessful in either losing weight, maintaining weight loss, or reaching her healthy weight goal.  Lori Montgomery is currently in the action stage of change and ready to dedicate time achieving and maintaining a healthier weight. Lori Montgomery is interested in becoming our patient and working on intensive lifestyle modifications including (but not limited to) diet and exercise for weight loss.  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 She live with husband Lori Montgomery and 18 year old daughter, Lori Montgomery. She has been seen at [facility] (medium weight loss- only did it for 2-3 weeks and stopped 3 weeks ago). She was on phentermine. Pt dislikes fish and yogurt. She snacks on sweets and skips breakfast QD. Her worst habit is snacking.  Lori Montgomery's habits were reviewed today and are as follows: Her family eats meals together, she thinks her family will eat healthier with her, her desired weight loss is 90 lbs, she has been heavy most of her life, she started gaining weight after childbirth and medications, her heaviest weight ever was 297 pounds, she has significant food cravings issues, she snacks frequently in the evenings, she skips meals frequently, she is frequently drinking liquids with calories, she frequently makes poor food choices, and she struggles with emotional eating.  Depression Screen Lori Montgomery's Food and Mood (modified PHQ-9) score was 21.  Depression screen PHQ 2/9 04/17/2021  Decreased Interest 3  Down, Depressed, Hopeless 3  PHQ - 2 Score 6  Altered sleeping 3  Tired, decreased energy 3  Change in  appetite 1  Feeling bad or failure about yourself  3  Trouble concentrating 2  Moving slowly or fidgety/restless 3  Suicidal thoughts 0  PHQ-9 Score 21  Difficult doing work/chores Extremely dIfficult   Subjective:   1. Other fatigue Lori Montgomery admits to daytime somnolence and admits to waking up still tired. Patent has a history of symptoms of daytime fatigue, morning fatigue, and morning headache. Lori Montgomery generally gets 8 hours of sleep per night, and states that she has poor sleep quality. Snoring is present. Apneic episodes are present. Epworth Sleepiness Score is 19.  2. Shortness of breath on exertion [name] notes increasing shortness of breath with exercising and seems to be worsening over time with weight gain. She notes getting out of breath sooner with activity than she used to. This has gotten worse recently. Lori Montgomery denies shortness of breath at rest or orthopnea.  3. Essential hypertension At goal. Medications: amlodipine, HCTZ, olmesartan, and Lasix.   BP Readings from Last 3 Encounters:  04/17/21 118/83  12/27/18 105/65  07/12/15 102/64   Lab Results  Component Value Date   CREATININE 0.78 07/09/2015   4. Mood disorder (Lori Montgomery) with emotional eating Uncontrolled.  Medication: Buspar, Lexapro, and diazepam. Pt has depression, generalized anxiety disorder and emotional eating. PHQ= 21. Pt denies suicidal or homicidal ideations. She is treated by her PCP.  5. Vitamin D deficiency Lori Montgomery is not currently on Vit D supplementation.   6. Status post gastric bypass for obesity and LapBand Pt is without concerns for amount of food eaten or timing of eating. She denies issues currently.  7. At risk for deficient intake of food Lori Montgomery is at risk for deficient intake of food due to skipping meals.  Assessment/Plan:   Orders Placed This Encounter  Procedures   Vitamin B12   CBC with Differential/Platelet   Comprehensive metabolic panel   Folate   Hemoglobin A1c   Insulin, random    Lipid Panel With LDL/HDL Ratio   T4, free   T3   TSH   VITAMIN D 25 Hydroxy (Vit-D Deficiency, Fractures)    Medications Discontinued During This Encounter  Medication Reason   furosemide (LASIX) 20 MG tablet Error   buPROPion (WELLBUTRIN XL) 300 MG 24 hr tablet Error   diclofenac sodium (VOLTAREN) 1 % GEL Error   nebivolol (BYSTOLIC) 5 MG tablet Error   venlafaxine XR (EFFEXOR-XR) 150 MG 24 hr capsule Error     No orders of the defined types were placed in this encounter.    1. Other fatigue Lori Montgomery does feel that her weight is causing her energy to be lower than it should be. Fatigue may be related to obesity, depression or many other causes. Labs will be ordered, and in the meanwhile, Lori Montgomery will focus on self care including making healthy food choices, increasing physical activity and focusing on stress reduction. Check labs today.  - Vitamin B12 - CBC with Differential/Platelet - Comprehensive metabolic panel - Folate - Hemoglobin A1c - Insulin, random - Lipid Panel With LDL/HDL Ratio - T4, free - T3 - TSH - VITAMIN D 25 Hydroxy (Vit-D Deficiency, Fractures)  2. Shortness of breath on exertion Lori Montgomery does feel that she gets out of breath more easily that she used to when she exercises. Lori Montgomery's shortness of breath appears to be obesity related and exercise induced. She has agreed to work on weight loss and gradually increase exercise to treat her exercise induced shortness of breath. Will continue to monitor closely.  3. Essential hypertension Avoid buying foods that are: processed, frozen, or prepackaged to avoid excess salt. We will continue to monitor closely alongside her PCP and/or Specialist.  Regular follow up with PCP and specialists was also encouraged.   4. Mood disorder (Lori Montgomery) with emotional eating Pt will follow up with PCP for management of symptoms.   5. Vitamin D deficiency Check Vit D level today.  6. Status post gastric bypass for obesity and LapBand Will  follow up as needed.  7. Screening for depression Lori Montgomery had a positive depression screening. Depression is commonly associated with obesity and often results in emotional eating behaviors. We will monitor this closely and work on CBT to help improve the non-hunger eating patterns. Referral to Psychology may be required if no improvement is seen as she continues in our clinic.  8. At risk for deficient intake of food Ahtziri was given extensive education and counseling today of more than 30 minutes on risks associated with deficient food intake.  Counseled her on the importance of following our prescribed meal plan and eating adequate amounts of protein.  Discussed with Milagros Evener that inadequate food intake over longer periods of time can slow their metabolism down significantly.   9. Obesity with current BMI of 44.1  Annaly is currently in the action stage of change and her goal is to continue with weight loss efforts. I recommend Hadiya begin the structured treatment plan as follows:  She has agreed to the Category 3 Plan.  Exercise goals:  As is    Behavioral modification strategies: no skipping meals, meal planning and cooking strategies,  and planning for success.  She was informed of the importance of frequent follow-up visits to maximize her success with intensive lifestyle modifications for her multiple health conditions. She was informed we would discuss her lab results at her next visit unless there is a critical issue that needs to be addressed sooner. Jaeanna agreed to keep her next visit at the agreed upon time to discuss these results.  Objective:   Blood pressure 118/83, pulse 73, temperature 98 F (36.7 C), height '5\' 8"'$  (1.727 m), weight 290 lb (131.5 kg), SpO2 94 %. Body mass index is 44.09 kg/m.  EKG: Normal sinus rhythm, rate (not preformed).  Indirect Calorimeter completed today shows a VO2 of 275 and a REE of 1901.  Her calculated basal metabolic rate is [id number] thus her basal  metabolic rate is worse than expected.  General: Cooperative, alert, well developed, in no acute distress. HEENT: Conjunctivae and lids unremarkable. Cardiovascular: Regular rhythm.  Lungs: Normal work of breathing. Neurologic: No focal deficits.   Lab Results  Component Value Date   CREATININE 0.78 07/09/2015   BUN 12 07/09/2015   NA 135 07/09/2015   K 3.6 07/09/2015   CL 102 07/09/2015   CO2 26 07/09/2015   Lab Results  Component Value Date   ALT 57 (H) 02/28/2014   AST 55 (H) 02/28/2014   ALKPHOS 74 02/28/2014   BILITOT 0.3 02/28/2014   No results found for: HGBA1C No results found for: INSULIN Lab Results  Component Value Date   TSH 2.348 11/01/2013   Lab Results  Component Value Date   CHOL 228 (H) 11/01/2013   HDL 57 11/01/2013   LDLCALC 147 (H) 11/01/2013   TRIG 121 11/01/2013   CHOLHDL 4.0 11/01/2013   Lab Results  Component Value Date   WBC 12.5 (H) 07/12/2015   HGB 10.5 (L) 07/12/2015   HCT 32.8 (L) 07/12/2015   MCV 88.4 07/12/2015   PLT 264 07/12/2015   Attestation Statements:   Reviewed by clinician on day of visit: allergies, medications, problem list, medical history, surgical history, family history, social history, and previous encounter notes.  Coral Ceo, CMA, am acting as transcriptionist for [employer], DO.  I have reviewed the above documentation for accuracy and completeness, and I agree with the above. Marjory Sneddon, D.O.  The [place] was signed into law in 2016 which includes the topic of electronic health records.  This provides immediate access to information in MyChart.  This includes consultation notes, operative notes, office notes, lab results and pathology reports.  If you have any questions about what you read please let us know at your next visit so we can discuss your concerns and take corrective action if need be.  We are right here with you.

## 2021-04-18 LAB — CBC WITH DIFFERENTIAL/PLATELET
Basophils Absolute: 0 10*3/uL (ref 0.0–0.2)
Basos: 1 %
EOS (ABSOLUTE): 0.2 10*3/uL (ref 0.0–0.4)
Eos: 4 %
Hematocrit: 41.7 % (ref 34.0–46.6)
Hemoglobin: 13.2 g/dL (ref 11.1–15.9)
Immature Grans (Abs): 0 10*3/uL (ref 0.0–0.1)
Immature Granulocytes: 0 %
Lymphocytes Absolute: 2.6 10*3/uL (ref 0.7–3.1)
Lymphs: 43 %
MCH: 28.9 pg (ref 26.6–33.0)
MCHC: 31.7 g/dL (ref 31.5–35.7)
MCV: 91 fL (ref 79–97)
Monocytes Absolute: 0.4 10*3/uL (ref 0.1–0.9)
Monocytes: 7 %
Neutrophils Absolute: 2.7 10*3/uL (ref 1.4–7.0)
Neutrophils: 45 %
Platelets: 245 10*3/uL (ref 150–450)
RBC: 4.56 x10E6/uL (ref 3.77–5.28)
RDW: 13 % (ref 11.7–15.4)
WBC: 6.1 10*3/uL (ref 3.4–10.8)

## 2021-04-18 LAB — COMPREHENSIVE METABOLIC PANEL
ALT: 16 IU/L (ref 0–32)
AST: 13 IU/L (ref 0–40)
Albumin/Globulin Ratio: 1.7 (ref 1.2–2.2)
Albumin: 4.1 g/dL (ref 3.8–4.9)
Alkaline Phosphatase: 72 IU/L (ref 44–121)
BUN/Creatinine Ratio: 20 (ref 9–23)
BUN: 15 mg/dL (ref 6–24)
Bilirubin Total: 0.3 mg/dL (ref 0.0–1.2)
CO2: 26 mmol/L (ref 20–29)
Calcium: 9.4 mg/dL (ref 8.7–10.2)
Chloride: 104 mmol/L (ref 96–106)
Creatinine, Ser: 0.74 mg/dL (ref 0.57–1.00)
Globulin, Total: 2.4 g/dL (ref 1.5–4.5)
Glucose: 85 mg/dL (ref 65–99)
Potassium: 4.4 mmol/L (ref 3.5–5.2)
Sodium: 143 mmol/L (ref 134–144)
Total Protein: 6.5 g/dL (ref 6.0–8.5)
eGFR: 98 mL/min/{1.73_m2} (ref >59)

## 2021-04-18 LAB — VITAMIN D 25 HYDROXY (VIT D DEFICIENCY, FRACTURES): Vit D, 25-Hydroxy: 25.2 ng/mL — ABNORMAL LOW (ref 30.0–100.0)

## 2021-04-18 LAB — HEMOGLOBIN A1C
Est. average glucose Bld gHb Est-mCnc: 120 mg/dL
Hgb A1c MFr Bld: 5.8 % — ABNORMAL HIGH (ref 4.8–5.6)

## 2021-04-18 LAB — LIPID PANEL WITH LDL/HDL RATIO
Cholesterol, Total: 215 mg/dL — ABNORMAL HIGH (ref 100–199)
HDL: 54 mg/dL (ref >39)
LDL Chol Calc (NIH): 141 mg/dL — ABNORMAL HIGH (ref 0–99)
LDL/HDL Ratio: 2.6 ratio (ref 0.0–3.2)
Triglycerides: 110 mg/dL (ref 0–149)
VLDL Cholesterol Cal: 20 mg/dL (ref 5–40)

## 2021-04-18 LAB — T4, FREE: Free T4: 0.83 ng/dL (ref 0.82–1.77)

## 2021-04-18 LAB — FOLATE: Folate: 6.4 ng/mL (ref >3.0)

## 2021-04-18 LAB — T3: T3, Total: 113 ng/dL (ref 71–180)

## 2021-04-18 LAB — TSH: TSH: 4.24 u[IU]/mL (ref 0.450–4.500)

## 2021-04-18 LAB — VITAMIN B12: Vitamin B-12: 350 pg/mL (ref 232–1245)

## 2021-04-18 LAB — INSULIN, RANDOM: INSULIN: 18.6 u[IU]/mL (ref 2.6–24.9)

## 2021-04-21 ENCOUNTER — Encounter (INDEPENDENT_AMBULATORY_CARE_PROVIDER_SITE_OTHER): Payer: Self-pay

## 2021-04-29 ENCOUNTER — Telehealth (INDEPENDENT_AMBULATORY_CARE_PROVIDER_SITE_OTHER): Payer: 59 | Admitting: Psychology

## 2021-04-29 DIAGNOSIS — F909 Attention-deficit hyperactivity disorder, unspecified type: Secondary | ICD-10-CM

## 2021-04-29 DIAGNOSIS — F419 Anxiety disorder, unspecified: Secondary | ICD-10-CM | POA: Diagnosis not present

## 2021-04-29 DIAGNOSIS — F3289 Other specified depressive episodes: Secondary | ICD-10-CM

## 2021-05-01 ENCOUNTER — Ambulatory Visit (INDEPENDENT_AMBULATORY_CARE_PROVIDER_SITE_OTHER): Payer: 59 | Admitting: Family Medicine

## 2021-05-01 ENCOUNTER — Other Ambulatory Visit: Payer: Self-pay

## 2021-05-01 ENCOUNTER — Encounter (INDEPENDENT_AMBULATORY_CARE_PROVIDER_SITE_OTHER): Payer: Self-pay | Admitting: Family Medicine

## 2021-05-01 VITALS — BP 118/78 | HR 70 | Temp 97.8°F | Ht 68.0 in | Wt 280.0 lb

## 2021-05-01 DIAGNOSIS — E559 Vitamin D deficiency, unspecified: Secondary | ICD-10-CM | POA: Diagnosis not present

## 2021-05-01 DIAGNOSIS — F39 Unspecified mood [affective] disorder: Secondary | ICD-10-CM

## 2021-05-01 DIAGNOSIS — I10 Essential (primary) hypertension: Secondary | ICD-10-CM | POA: Diagnosis not present

## 2021-05-01 DIAGNOSIS — Z9189 Other specified personal risk factors, not elsewhere classified: Secondary | ICD-10-CM

## 2021-05-01 DIAGNOSIS — G479 Sleep disorder, unspecified: Secondary | ICD-10-CM | POA: Diagnosis not present

## 2021-05-01 DIAGNOSIS — R7303 Prediabetes: Secondary | ICD-10-CM

## 2021-05-01 DIAGNOSIS — E7849 Other hyperlipidemia: Secondary | ICD-10-CM

## 2021-05-01 DIAGNOSIS — E66813 Obesity, class 3: Secondary | ICD-10-CM

## 2021-05-01 DIAGNOSIS — Z6841 Body Mass Index (BMI) 40.0 and over, adult: Secondary | ICD-10-CM

## 2021-05-01 MED ORDER — VITAMIN D (ERGOCALCIFEROL) 1.25 MG (50000 UNIT) PO CAPS: 50000.0000 [IU] | ORAL_CAPSULE | ORAL | 0 refills | Status: DC

## 2021-05-01 NOTE — Patient Instructions (Signed)
The 10-year ASCVD risk score (Arnett DK, et al., 2019) is: 1.7%   Values used to calculate the score:     Age: 50 years     Sex: Female     Is Non-Hispanic African American: No     Diabetic: No     Tobacco smoker: No     Systolic Blood Pressure: 395 mmHg     Is BP treated: Yes     HDL Cholesterol: 54 mg/dL     Total Cholesterol: 215 mg/dL

## 2021-05-02 NOTE — Progress Notes (Signed)
Chief Complaint:   OBESITY Lori Montgomery is here to discuss her progress with her obesity treatment plan along with follow-up of her obesity related diagnoses. Lori Montgomery is on the Category 3 Plan and states she is following her eating plan approximately 80% of the time. Lori Montgomery states she is not currently exercising.  Today's visit was #: 2 Starting weight: 290 lbs Starting date: 04/17/2021 Today's weight: 280 lbs Today's date: 05/01/2021 Total lbs lost to date: 10 Total lbs lost since last in-office visit: 10  Interim History: Lori Montgomery is here today for her first follow-up office visit since starting the program with [place].  All blood work/ lab tests that were recently ordered by myself or an outside provider were reviewed with patient today per their request.   Extended time was spent counseling her on all new disease processes that were discovered or preexisting ones that are affected by BMI.  she understands that many of these abnormalities will need to monitored regularly along with the current treatment plan of prudent dietary changes, in which we are making each and every office visit, to improve these health parameters.  Lori Montgomery reports breakfast is easy, but she does not get all vegetables in at dinner and can't eat all foods, as it is too much at once.  Too much meat/ protein.   Plan:  Strategies d/c pt of how to get all foods on plan in within a day.  Extensive discussion had  Subjective:   1. Essential hypertension Discussed labs with patient today. At goal. Medication: Norvasc, Benicar, Lasix, HCTZ  BP Readings from Last 3 Encounters:  05/01/21 118/78  04/17/21 118/83  12/27/18 105/65   Lab Results  Component Value Date   CREATININE 0.74 04/17/2021   CREATININE 0.78 07/09/2015   CREATININE 0.67 02/28/2014   2. Vitamin D deficiency Discussed labs with patient today. She is currently taking OTC vitamin D each day. She denies nausea, vomiting or muscle weakness.  Lab Results   Component Value Date   VD25OH 25.2 (L) 04/17/2021   3. Mood disorder ([place]) with emotional eating Discussed labs with patient today. Lori Montgomery has seen Dr. Mallie Mussel and appt went well. Her PCP, whom she recently saw made no changes to pt's mood medications.  4. Sleep disturbance Discussed labs with patient today. Pt did follow up with PCP and was referred for a sleep study.  5. Pre-diabetes New onset. Discussed labs with patient today. Lori Montgomery has elevated A1c and fasting insulin. No prior history.  Lab Results  Component Value Date   HGBA1C 5.8 (H) 04/17/2021   Lab Results  Component Value Date   INSULIN 18.6 04/17/2021   6. Other hyperlipidemia New. Discussed labs with patient today. Medication: none  The 10-year ASCVD risk score (Arnett DK, et al., 2019) is: 1.7%   Values used to calculate the score:     Age: 1 years     Sex: Female     Is Non-Hispanic African American: No     Diabetic: No     Tobacco smoker: No     Systolic Blood Pressure: 127 mmHg     Is BP treated: Yes     HDL Cholesterol: 54 mg/dL     Total Cholesterol: 215 mg/dL  7. At risk for diabetes mellitus Skarlett is at higher than average risk for developing diabetes due to new onset pre-diabetes.    Assessment/Plan:   Meds ordered this encounter  Medications   Vitamin D, Ergocalciferol, (DRISDOL) 1.25 MG (50000 UNIT)  CAPS capsule    Sig: Take 1 capsule (50,000 Units total) by mouth every 7 (seven) days.    Dispense:  4 capsule    Refill:  0    30 d supply;  ** OV for RF **   Do not send RF request     1. Essential hypertension Lynisha is working on healthy weight loss and exercise to improve blood pressure control. Decrease salt, increase water.  We will watch for signs of hypotension as she continues her lifestyle modifications. Continue current treatment plan as Bp at goal today  2. Vitamin D deficiency Low Vitamin D level contributes to fatigue and are associated with obesity, breast, and colon cancer.  She agrees to start prescription Vitamin D 50,000 IU every week and will follow-up for routine testing of Vitamin D, at least 2-3 times per year to avoid over-replacement.  Start- Vitamin D, Ergocalciferol, (DRISDOL) 1.25 MG (50000 UNIT) CAPS capsule; Take 1 capsule (50,000 Units total) by mouth every 7 (seven) days.  Dispense: 4 capsule; Refill: 0  3. Mood disorder ([place]) with emotional eating Behavior modification techniques were discussed today to help Lori Montgomery deal with her emotional/non-hunger eating behaviors.   Provided psychoeducation and facilitated discussion regarding eating as habit, self-monitoring, stimulus control, regulating eating pattern, coping skills, and barriers to success.  Discussed cues and consequences, how thoughts affect eating, model of thoughts, feelings, and behaviors, and strategies for change by focusing on the cue. Discussed cognitive distortions, coping thoughts, and how to change your thoughts. Orders and follow up as documented in patient record.   Continue with Dr. Mallie Mussel.    4. Sleep disturbance We will follow up after sleep study.  OSA is a cause of systemic hypertension and is associated with an increased incidence of stroke, heart failure, atrial fibrillation, and coronary heart disease. Severe OSA increases all-cause mortality and cardiovascular mortality.   Goal: Treatment of OSA via CPAP compliance and weight loss. Plasma ghrelin levels (appetite or "hunger hormone") are significantly higher in OSA patients than in BMI-matched controls, but decrease to levels similar to those of obese patients without OSA after CPAP treatment.  Weight loss improves OSA by several mechanisms, including reduction in fatty tissue in the throat (i.e. parapharyngeal fat) and the tongue. Loss of abdominal fat increases mediastinal traction on the upper airway making it less likely to collapse during sleep. Studies have also shown that compliance with CPAP treatment improves leptin  (hunger inhibitory hormone) imbalance.   5. Pre-diabetes Annie will continue to work on weight loss, exercise, and decreasing simple carbohydrates to help decrease the risk of diabetes. Recheck labs in 3 months.   6. Other hyperlipidemia Cardiovascular risk and specific lipid/LDL goals reviewed.  LDL above goal but medications not indicated at this time.   The 10-year ASCVD risk score (Arnett DK, et al., 2019) is: 1.7%   Values used to calculate the score:     Age: 40 years     Sex: Female     Is Non-Hispanic African American: No     Diabetic: No     Tobacco smoker: No     Systolic Blood Pressure: 481 mmHg     Is BP treated: Yes     HDL Cholesterol: 54 mg/dL     Total Cholesterol: 215 mg/dL   We discussed several lifestyle modifications today and Mabrey will continue to work on diet, exercise and weight loss efforts. Orders and follow up as documented in patient record.   Counseling Intensive lifestyle modifications  are the first line treatment for this issue. Dietary changes: Increase soluble fiber. Decrease simple carbohydrates. Exercise changes: Moderate to vigorous-intensity aerobic activity 150 minutes per week if tolerated. Lipid-lowering medications: see documented in medical record.   7. At risk for diabetes mellitus Raelea was given approximately 23 minutes of diabetes prevention education and counseling today.  - Counseled patient on pathophysiology of disease and meaning/ implication of lab results.  - Reviewed how certain foods can either stimulate or inhibit insulin release, and subsequently affect hunger pathways  - Importance of following a healthy meal plan with limiting amounts of simple carbohydrates discussed with patient - Effects of regular aerobic exercise on blood sugar regulation reviewed and encouraged an eventual goal of 30 min 5d/week or more as a minimum.  - Briefly discussed treatment options, which always include dietary and lifestyle modification as first  line.   - Handouts provided at patient's desire and/or told to go online to the American Diabetes Association website for further information.   Repetitive spaced learning was employed today to elicit superior memory formation and behavioral change.   8. Obesity with current BMI of 42.7  Kaija is currently in the action stage of change. As such, her goal is to continue with weight loss efforts. She has agreed to the Category 3 Plan.   Exercise goals:  As is  Behavioral modification strategies: keeping healthy foods in the home and planning for success.  Lori Montgomery has agreed to follow-up with our clinic in 2 weeks. She was informed of the importance of frequent follow-up visits to maximize her success with intensive lifestyle modifications for her multiple health conditions.   Objective:   Blood pressure 118/78, pulse 70, temperature 97.8 F (36.6 C), height 5\' 8"  (1.727 m), weight 280 lb (127 kg), SpO2 98 %. Body mass index is 42.57 kg/m.  General: Cooperative, alert, well developed, in no acute distress. HEENT: Conjunctivae and lids unremarkable. Cardiovascular: Regular rhythm.  Lungs: Normal work of breathing. Neurologic: No focal deficits.   Lab Results  Component Value Date   CREATININE 0.74 04/17/2021   BUN 15 04/17/2021   NA 143 04/17/2021   K 4.4 04/17/2021   CL 104 04/17/2021   CO2 26 04/17/2021   Lab Results  Component Value Date   ALT 16 04/17/2021   AST 13 04/17/2021   ALKPHOS 72 04/17/2021   BILITOT 0.3 04/17/2021   Lab Results  Component Value Date   HGBA1C 5.8 (H) 04/17/2021   Lab Results  Component Value Date   INSULIN 18.6 04/17/2021   Lab Results  Component Value Date   TSH 4.240 04/17/2021   Lab Results  Component Value Date   CHOL 215 (H) 04/17/2021   HDL 54 04/17/2021   LDLCALC 141 (H) 04/17/2021   TRIG 110 04/17/2021   CHOLHDL 4.0 11/01/2013   Lab Results  Component Value Date   VD25OH 25.2 (L) 04/17/2021   Lab Results  Component  Value Date   WBC 6.1 04/17/2021   HGB 13.2 04/17/2021   HCT 41.7 04/17/2021   MCV 91 04/17/2021   PLT 245 04/17/2021    Attestation Statements:   Reviewed by clinician on day of visit: allergies, medications, problem list, medical history, surgical history, family history, social history, and previous encounter notes.  Coral Ceo, CMA, am acting as transcriptionist for [employer], DO.  I have reviewed the above documentation for accuracy and completeness, and I agree with the above. Marjory Sneddon, D.O.  The  [place] was signed into law in 2016 which includes the topic of electronic health records.  This provides immediate access to information in MyChart.  This includes consultation notes, operative notes, office notes, lab results and pathology reports.  If you have any questions about what you read please let us know at your next visit so we can discuss your concerns and take corrective action if need be.  We are right here with you.

## 2021-05-06 NOTE — Progress Notes (Unsigned)
  Office: [phone]  /  Fax: [phone]    Date: May 20, 2021   Appointment Start Time: *** Duration: *** minutes Provider: Glennie Isle, Psy.D. Type of Session: Individual Therapy  Location of Patient: {gbptloc:23249} Location of Provider: Provider's Home (private office) Type of Contact: Telepsychological Visit via MyChart Video Visit  Session Content: This provider called Lori Montgomery at 2:33pm as she did not present for today's appointment. A HIPAA compliant voicemail was left requesting a call back.   As such, today's appointment was initiated *** minutes late. Lori Montgomery is a 50 y.o. female presenting for a follow-up appointment to address the previously established treatment goal of increasing coping skills.Today's appointment was a telepsychological visit due to COVID-19. Lori Montgomery provided verbal consent for today's telepsychological appointment and she is aware she is responsible for securing confidentiality on her end of the session. Prior to proceeding with today's appointment, Lori Montgomery's physical location at the time of this appointment was obtained as well a phone number she could be reached at in the event of technical difficulties. Lori Montgomery and this provider participated in today's telepsychological service.   This provider conducted a brief check-in. *** Lori Montgomery was receptive to today's appointment as evidenced by openness to sharing, responsiveness to feedback, and {gbreceptiveness:23401}.  Mental Status Examination:  Appearance: {Appearance:22431} Behavior: {Behavior:22445} Mood: {gbmood:21757} Affect: {Affect:22436} Speech: {Speech:22432} Eye Contact: {Eye Contact:22433} Psychomotor Activity: {Motor Activity:22434} Gait: {gbgait:23404} Thought Process: {thought process:22448}  Thought Content/Perception: {disturbances:22451} Orientation: {Orientation:22437} Memory/Concentration: {gbcognition:22449} Insight/Judgment: {Insight:22446}  Interventions:  {Interventions for Progress  Notes:23405}  DSM-5 Diagnosis(es): F50.89 Other Specified Feeding or Eating Disorder, Emotional Eating Behaviors, 314.01 (F90.9) Unspecified Attention-Deficit/Hyperactivity Disorder , and F41.9 Unspecified Anxiety Disorder  Treatment Goal & Progress: During the initial appointment with this provider, the following treatment goal was established: increase coping skills. Lori Montgomery has demonstrated progress in her goal as evidenced by {gbtxprogress:22839}. Lori Montgomery also {gbtxprogress2:22951}.  Plan: The next appointment will be scheduled in {gbweeks:21758}, which will be {gbtxmodality:23402}. The next session will focus on {Plan for Next Appointment:23400}.

## 2021-05-15 ENCOUNTER — Encounter (INDEPENDENT_AMBULATORY_CARE_PROVIDER_SITE_OTHER): Payer: Self-pay | Admitting: Family Medicine

## 2021-05-15 ENCOUNTER — Other Ambulatory Visit: Payer: Self-pay

## 2021-05-15 ENCOUNTER — Ambulatory Visit (INDEPENDENT_AMBULATORY_CARE_PROVIDER_SITE_OTHER): Payer: 59 | Admitting: Family Medicine

## 2021-05-15 ENCOUNTER — Other Ambulatory Visit (INDEPENDENT_AMBULATORY_CARE_PROVIDER_SITE_OTHER): Payer: Self-pay | Admitting: Family Medicine

## 2021-05-15 VITALS — BP 115/77 | HR 69 | Temp 97.9°F | Ht 68.0 in | Wt 276.0 lb

## 2021-05-15 DIAGNOSIS — R7303 Prediabetes: Secondary | ICD-10-CM

## 2021-05-15 DIAGNOSIS — E559 Vitamin D deficiency, unspecified: Secondary | ICD-10-CM

## 2021-05-15 DIAGNOSIS — Z6841 Body Mass Index (BMI) 40.0 and over, adult: Secondary | ICD-10-CM

## 2021-05-15 DIAGNOSIS — Z9189 Other specified personal risk factors, not elsewhere classified: Secondary | ICD-10-CM | POA: Diagnosis not present

## 2021-05-15 MED ORDER — TIRZEPATIDE 2.5 MG/0.5ML ~~LOC~~ SOAJ: 2.5000 mg | SUBCUTANEOUS | 0 refills | Status: DC

## 2021-05-16 ENCOUNTER — Encounter (INDEPENDENT_AMBULATORY_CARE_PROVIDER_SITE_OTHER): Payer: Self-pay | Admitting: Family Medicine

## 2021-05-19 DIAGNOSIS — R7303 Prediabetes: Secondary | ICD-10-CM | POA: Insufficient documentation

## 2021-05-19 DIAGNOSIS — E559 Vitamin D deficiency, unspecified: Secondary | ICD-10-CM | POA: Insufficient documentation

## 2021-05-19 NOTE — Progress Notes (Signed)
Chief Complaint:   OBESITY Lori Montgomery is here to discuss her progress with her obesity treatment plan along with follow-up of her obesity related diagnoses. Lori Montgomery is on the Category 3 Plan and states she is following her eating plan approximately 5% of the time. Lori Montgomery states she is doing 0 minutes 0 times per week.  Today's visit was #: 3 Starting weight: 290 lbs Starting date: 04/17/2021 Today's weight: 276 lbs Today's date: 05/15/2021 Total lbs lost to date: 14 lbs Total lbs lost since last in-office visit: 4 lbs  Interim History: Lori Montgomery was only on plan at breakfast the last few weeks. She is S/P lap-band and then RNYGB and has no portion restriction. However, she did well with weight loss and has lost 14 lbs over a month. She would like to have medication to help with hunger.  Subjective:   1. Prediabetes Lori Montgomery's last A1C was 5.8. She notes some polyphagia. She is not on medications currently. She has no history of pancreatitis or personal family history of thyroid cancer. She is S/P Cholecystectomy.   Lab Results  Component Value Date   HGBA1C 5.8 (H) 04/17/2021   Lab Results  Component Value Date   INSULIN 18.6 04/17/2021    2. Vitamin D deficiency Lori Montgomery's Vitamin D is low at 25.2. She is on weekly prescription Vitamin D.  Lab Results  Component Value Date   VD25OH 25.2 (L) 04/17/2021    3. At risk for side effect of medication Lori Montgomery is at risk for side effect of medication due to start of Mounjaro.  Assessment/Plan:   1. Prediabetes Shakiah agrees to start Mounjaro 2.5 mg weekly.  A coupon was also provided.   - tirzepatide [facility]) 2.5 MG/0.5ML Pen; Inject 2.5 mg into the skin once a week.  Dispense: 2 mL; Refill: 0  2. Vitamin D deficiency Lori Montgomery agrees to continue to take prescription Vitamin D 50,000 IU every week and Lori Montgomery will follow-up for routine testing of Vitamin D, at least 2-3 times per year to avoid over-replacement.   3. At risk for side effect of  medication Lori Montgomery was given approximately 15 minutes of drug side effect counseling today.  We discussed side effect possibility and risk versus benefits. Lori Montgomery agreed to the medication and will contact this office if these side effects are intolerable.  Repetitive spaced learning was employed today to elicit superior memory formation and behavioral change.   4. Obesity with current BMI of 41.98 Lori Montgomery is currently in the action stage of change. As such, her goal is to continue with weight loss efforts. She has agreed to the Category 3 Plan.   Handouts: Protein equivalents was given today.  Exercise goals: No exercise has been prescribed at this time.  Behavioral modification strategies: increasing lean protein intake, decreasing simple carbohydrates, and meal planning and cooking strategies.  Lori Montgomery has agreed to follow-up with our clinic in 2 weeks.  Objective:   Blood pressure 115/77, pulse 69, temperature 97.9 F (36.6 C), height 5\' 8"  (1.727 m), weight 276 lb (125.2 kg), SpO2 97 %. Body mass index is 41.97 kg/m.  General: Cooperative, alert, well developed, in no acute distress. HEENT: Conjunctivae and lids unremarkable. Cardiovascular: Regular rhythm.  Lungs: Normal work of breathing. Neurologic: No focal deficits.   Lab Results  Component Value Date   CREATININE 0.74 04/17/2021   BUN 15 04/17/2021   NA 143 04/17/2021   K 4.4 04/17/2021   CL 104 04/17/2021   CO2 26 04/17/2021  Lab Results  Component Value Date   ALT 16 04/17/2021   AST 13 04/17/2021   ALKPHOS 72 04/17/2021   BILITOT 0.3 04/17/2021   Lab Results  Component Value Date   HGBA1C 5.8 (H) 04/17/2021   Lab Results  Component Value Date   INSULIN 18.6 04/17/2021   Lab Results  Component Value Date   TSH 4.240 04/17/2021   Lab Results  Component Value Date   CHOL 215 (H) 04/17/2021   HDL 54 04/17/2021   LDLCALC 141 (H) 04/17/2021   TRIG 110 04/17/2021   CHOLHDL 4.0 11/01/2013   Lab Results   Component Value Date   VD25OH 25.2 (L) 04/17/2021   Lab Results  Component Value Date   WBC 6.1 04/17/2021   HGB 13.2 04/17/2021   HCT 41.7 04/17/2021   MCV 91 04/17/2021   PLT 245 04/17/2021   No results found for: IRON, TIBC, FERRITIN  Attestation Statements:   Reviewed by clinician on day of visit: allergies, medications, problem list, medical history, surgical history, family history, social history, and previous encounter notes.  I, Lizbeth Bark, RMA, am acting as Location manager for [employer], [place].   I have reviewed the above documentation for accuracy and completeness, and I agree with the above. -  Georgianne Fick, FNP

## 2021-05-20 ENCOUNTER — Telehealth (INDEPENDENT_AMBULATORY_CARE_PROVIDER_SITE_OTHER): Payer: 59 | Admitting: Psychology

## 2021-05-20 ENCOUNTER — Telehealth (INDEPENDENT_AMBULATORY_CARE_PROVIDER_SITE_OTHER): Payer: Self-pay | Admitting: Psychology

## 2021-05-20 NOTE — Telephone Encounter (Signed)
  Office: [phone]  /  Fax: [phone]  Date of Call: May 20, 2021  Time of Call: 2:33pm Provider: Glennie Isle, PsyD  CONTENT: This provider called Lori Montgomery to check-in as she did not present for today's MyChart Video Visit appointment at 2:30pm. A HIPAA compliant voicemail was left requesting a call back. Of note, this provider stayed on the MyChart Video Visit appointment for 5 minutes prior to signing off per the clinic's grace period policy.    PLAN: This provider will wait for Lori Montgomery to call back. No further follow-up planned by this provider.

## 2021-05-22 ENCOUNTER — Encounter (INDEPENDENT_AMBULATORY_CARE_PROVIDER_SITE_OTHER): Payer: Self-pay

## 2021-05-22 ENCOUNTER — Other Ambulatory Visit (INDEPENDENT_AMBULATORY_CARE_PROVIDER_SITE_OTHER): Payer: Self-pay | Admitting: Family Medicine

## 2021-05-22 DIAGNOSIS — E559 Vitamin D deficiency, unspecified: Secondary | ICD-10-CM

## 2021-05-22 NOTE — Telephone Encounter (Signed)
Sent pt a message in [facility].

## 2021-05-27 ENCOUNTER — Encounter (INDEPENDENT_AMBULATORY_CARE_PROVIDER_SITE_OTHER): Payer: Self-pay | Admitting: Family Medicine

## 2021-05-27 ENCOUNTER — Other Ambulatory Visit: Payer: Self-pay

## 2021-05-27 ENCOUNTER — Ambulatory Visit (INDEPENDENT_AMBULATORY_CARE_PROVIDER_SITE_OTHER): Payer: 59 | Admitting: Family Medicine

## 2021-05-27 VITALS — BP 109/72 | HR 75 | Temp 97.6°F | Ht 68.0 in | Wt 274.0 lb

## 2021-05-27 DIAGNOSIS — Z6841 Body Mass Index (BMI) 40.0 and over, adult: Secondary | ICD-10-CM | POA: Diagnosis not present

## 2021-05-27 DIAGNOSIS — R7303 Prediabetes: Secondary | ICD-10-CM

## 2021-05-27 DIAGNOSIS — E559 Vitamin D deficiency, unspecified: Secondary | ICD-10-CM | POA: Diagnosis not present

## 2021-05-27 MED ORDER — TIRZEPATIDE 5 MG/0.5ML ~~LOC~~ SOAJ: 5.0000 mg | SUBCUTANEOUS | 0 refills | Status: DC

## 2021-05-27 MED ORDER — VITAMIN D (ERGOCALCIFEROL) 1.25 MG (50000 UNIT) PO CAPS: 50000.0000 [IU] | ORAL_CAPSULE | ORAL | 0 refills | Status: DC

## 2021-05-27 NOTE — Progress Notes (Signed)
Chief Complaint:   OBESITY Lori Montgomery is here to discuss her progress with her obesity treatment plan along with follow-up of her obesity related diagnoses. Lori Montgomery is on the Category 3 Plan and states she is following her eating plan approximately 10% of the time. Lori Montgomery states she is doing 0 minutes 0 times per week.  Today's visit was #: 4 Starting weight: 290 lbs Starting date: 04/17/2021 Today's weight: 274 lbs Today's date: 05/27/2021 Total lbs lost to date: 16 lbs Total lbs lost since last in-office visit: 2 lbs  Interim History: Lori Montgomery is very hard on herself because she feels she should have lost more weight.  She is down 2 lbs today. She has to overcome the feeling that our Category 3 plan is "too" much food and she won't lose weight if she eats the food.  She notes some cravings and hunger. She is not skipping meals. Protein intake has been good.   Subjective:   1. Prediabetes Lori Montgomery is not noticing appetite suppression. She notes cravings, hunger, and constipation. She denies nausea. She is on 2.5 mg of Mounjaro.  Lab Results  Component Value Date   HGBA1C 5.8 (H) 04/17/2021   Lab Results  Component Value Date   INSULIN 18.6 04/17/2021    2. Vitamin D deficiency Lori Montgomery's Vitamin D is low at 44.4. She is on weekly prescription Vitamin D.  Lab Results  Component Value Date   VD25OH 25.2 (L) 04/17/2021    Assessment/Plan:   1. Prediabetes Lori Montgomery may take Miralax daily. She agreed to increase dose of Mounjaro from 2.5 mg to 5 mg weekly.   - tirzepatide [facility]) 5 MG/0.5ML Pen; Inject 5 mg into the skin once a week.  Dispense: 6 mL; Refill: 0  2. Vitamin D deficiency We will refill prescription Vitamin D 50,000 IU every week and Lori Montgomery will follow-up for routine testing of Vitamin D, at least 2-3 times per year to avoid over-replacement.  - Vitamin D, Ergocalciferol, (DRISDOL) 1.25 MG (50000 UNIT) CAPS capsule; Take 1 capsule (50,000 Units total) by mouth every 7 (seven)  days.  Dispense: 4 capsule; Refill: 0  3. Obesity with current BMI of 41.67 Lori Montgomery is currently in the action stage of change. As such, her goal is to continue with weight loss efforts. She has agreed to the Category 2 Plan plus 100 calories.   Exercise goals: No exercise has been prescribed at this time.  Behavioral modification strategies: increasing lean protein intake, meal planning and cooking strategies, and better snacking choices.  Lori Montgomery has agreed to follow-up with our clinic in 2 weeks.  Objective:   Blood pressure 109/72, pulse 75, temperature 97.6 F (36.4 C), height 5\' 8"  (1.727 m), weight 274 lb (124.3 kg), SpO2 98 %. Body mass index is 41.66 kg/m.  General: Cooperative, alert, well developed, in no acute distress. HEENT: Conjunctivae and lids unremarkable. Cardiovascular: Regular rhythm.  Lungs: Normal work of breathing. Neurologic: No focal deficits.   Lab Results  Component Value Date   CREATININE 0.74 04/17/2021   BUN 15 04/17/2021   NA 143 04/17/2021   K 4.4 04/17/2021   CL 104 04/17/2021   CO2 26 04/17/2021   Lab Results  Component Value Date   ALT 16 04/17/2021   AST 13 04/17/2021   ALKPHOS 72 04/17/2021   BILITOT 0.3 04/17/2021   Lab Results  Component Value Date   HGBA1C 5.8 (H) 04/17/2021   Lab Results  Component Value Date   INSULIN 18.6 04/17/2021  Lab Results  Component Value Date   TSH 4.240 04/17/2021   Lab Results  Component Value Date   CHOL 215 (H) 04/17/2021   HDL 54 04/17/2021   LDLCALC 141 (H) 04/17/2021   TRIG 110 04/17/2021   CHOLHDL 4.0 11/01/2013   Lab Results  Component Value Date   VD25OH 25.2 (L) 04/17/2021   Lab Results  Component Value Date   WBC 6.1 04/17/2021   HGB 13.2 04/17/2021   HCT 41.7 04/17/2021   MCV 91 04/17/2021   PLT 245 04/17/2021   No results found for: IRON, TIBC, FERRITIN  Attestation Statements:   Reviewed by clinician on day of visit: allergies, medications, problem list, medical  history, surgical history, family history, social history, and previous encounter notes.  I, Lizbeth Bark, RMA, am acting as Location manager for [employer], [place].   I have reviewed the above documentation for accuracy and completeness, and I agree with the above. -  Georgianne Fick, FNP

## 2021-06-09 ENCOUNTER — Ambulatory Visit (INDEPENDENT_AMBULATORY_CARE_PROVIDER_SITE_OTHER): Payer: 59 | Admitting: Family Medicine

## 2021-06-10 ENCOUNTER — Encounter (INDEPENDENT_AMBULATORY_CARE_PROVIDER_SITE_OTHER): Payer: Self-pay | Admitting: Family Medicine

## 2021-06-10 ENCOUNTER — Other Ambulatory Visit: Payer: Self-pay

## 2021-06-10 ENCOUNTER — Ambulatory Visit (INDEPENDENT_AMBULATORY_CARE_PROVIDER_SITE_OTHER): Payer: 59 | Admitting: Family Medicine

## 2021-06-10 VITALS — BP 110/71 | HR 77 | Temp 97.9°F | Ht 68.0 in | Wt 269.0 lb

## 2021-06-10 DIAGNOSIS — Z6841 Body Mass Index (BMI) 40.0 and over, adult: Secondary | ICD-10-CM

## 2021-06-10 DIAGNOSIS — R7303 Prediabetes: Secondary | ICD-10-CM | POA: Diagnosis not present

## 2021-06-10 NOTE — Progress Notes (Signed)
Chief Complaint:   OBESITY Lori Montgomery is here to discuss her progress with her obesity treatment plan along with follow-up of her obesity related diagnoses. Lori Montgomery is on the Category 2 Plan plus 100 caloreis and states she is following her eating plan approximately 70% of the time. Lori Montgomery states she is doing 0 minutes 0 times per week.  Today's visit was #: 5 Starting weight: 290 lbs Starting date: 04/17/2021 Today's weight: 269 lbs Today's date: 06/10/2021 Total lbs lost to date: 21 lbs Total lbs lost since last in-office visit: 4 lbs  Interim History: Lori Montgomery's meal plan was changed to Category 2 + 100 from Category 3. She is getting in all of the protein. She is keeping snack calories < 300. Category 2 plus 100 calories working well for her. She is very happy with her progress. She is down 21 lbs since September 8th.   Subjective:   1. Prediabetes Lori Montgomery states Lori Montgomery is working well for appetite at 2.5 mg. She will start 5.0 mg in a few days. She notes constipation but managing with OTC medications.   Lab Results  Component Value Date   HGBA1C 5.8 (H) 04/17/2021   Lab Results  Component Value Date   INSULIN 18.6 04/17/2021    Assessment/Plan:   1. Prediabetes Lori Montgomery will continue Mounjaro.   2. Obesity with current BMI of 40.91 Lori Montgomery is currently in the action stage of change. As such, her goal is to continue with weight loss efforts. She has agreed to the Category 2 Plan plus 100 calories.   Exercise goals:  Lori Montgomery will do cardio 3 times per week biking or walking.   Behavioral modification strategies: planning for success.  Lori Montgomery has agreed to follow-up with our clinic in 2 weeks.  Objective:   Blood pressure 110/71, pulse 77, temperature 97.9 F (36.6 C), height 5\' 8"  (1.727 m), weight 269 lb (122 kg), SpO2 96 %. Body mass index is 40.9 kg/m.  General: Cooperative, alert, well developed, in no acute distress. HEENT: Conjunctivae and lids unremarkable. Cardiovascular:  Regular rhythm.  Lungs: Normal work of breathing. Neurologic: No focal deficits.   Lab Results  Component Value Date   CREATININE 0.74 04/17/2021   BUN 15 04/17/2021   NA 143 04/17/2021   K 4.4 04/17/2021   CL 104 04/17/2021   CO2 26 04/17/2021   Lab Results  Component Value Date   ALT 16 04/17/2021   AST 13 04/17/2021   ALKPHOS 72 04/17/2021   BILITOT 0.3 04/17/2021   Lab Results  Component Value Date   HGBA1C 5.8 (H) 04/17/2021   Lab Results  Component Value Date   INSULIN 18.6 04/17/2021   Lab Results  Component Value Date   TSH 4.240 04/17/2021   Lab Results  Component Value Date   CHOL 215 (H) 04/17/2021   HDL 54 04/17/2021   LDLCALC 141 (H) 04/17/2021   TRIG 110 04/17/2021   CHOLHDL 4.0 11/01/2013   Lab Results  Component Value Date   VD25OH 25.2 (L) 04/17/2021   Lab Results  Component Value Date   WBC 6.1 04/17/2021   HGB 13.2 04/17/2021   HCT 41.7 04/17/2021   MCV 91 04/17/2021   PLT 245 04/17/2021   No results found for: IRON, TIBC, FERRITIN  Attestation Statements:   Reviewed by clinician on day of visit: allergies, medications, problem list, medical history, surgical history, family history, social history, and previous encounter notes.  I, Lizbeth Bark, RMA, am acting as Location manager for  Jake Bathe, FNP.   I have reviewed the above documentation for accuracy and completeness, and I agree with the above. -  Georgianne Fick, FNP

## 2021-06-24 ENCOUNTER — Ambulatory Visit (INDEPENDENT_AMBULATORY_CARE_PROVIDER_SITE_OTHER): Payer: 59 | Admitting: Bariatrics

## 2021-06-24 ENCOUNTER — Other Ambulatory Visit: Payer: Self-pay

## 2021-06-24 ENCOUNTER — Encounter (INDEPENDENT_AMBULATORY_CARE_PROVIDER_SITE_OTHER): Payer: Self-pay | Admitting: Bariatrics

## 2021-06-24 VITALS — HR 89 | Temp 97.5°F | Ht 68.0 in | Wt 269.0 lb

## 2021-06-24 DIAGNOSIS — Z6841 Body Mass Index (BMI) 40.0 and over, adult: Secondary | ICD-10-CM | POA: Diagnosis not present

## 2021-06-24 DIAGNOSIS — E559 Vitamin D deficiency, unspecified: Secondary | ICD-10-CM | POA: Diagnosis not present

## 2021-06-24 DIAGNOSIS — R7303 Prediabetes: Secondary | ICD-10-CM

## 2021-06-24 MED ORDER — TIRZEPATIDE 5 MG/0.5ML ~~LOC~~ SOAJ: 5.0000 mg | SUBCUTANEOUS | 0 refills | Status: DC

## 2021-06-24 MED ORDER — VITAMIN D (ERGOCALCIFEROL) 1.25 MG (50000 UNIT) PO CAPS: 50000.0000 [IU] | ORAL_CAPSULE | ORAL | 0 refills | Status: DC

## 2021-06-25 ENCOUNTER — Encounter (INDEPENDENT_AMBULATORY_CARE_PROVIDER_SITE_OTHER): Payer: Self-pay | Admitting: Bariatrics

## 2021-06-25 NOTE — Progress Notes (Signed)
Chief Complaint:   OBESITY Lori Montgomery is here to discuss her progress with her obesity treatment plan along with follow-up of her obesity related diagnoses. [name] is on the Category 2 Plan and states she is following her eating plan approximately 0% of the time. [name] states she is doing 0 minutes 0 times per week.  Today's visit was #: 6 Starting weight: 290 lbs Starting date: 04/17/2021 Today's weight: 269 lbs Today's date: 06/24/2021 Total lbs lost to date: 21 lbs Total lbs lost since last in-office visit: 0  Interim History: [name]'s weight remains the same and she has done well overall. She is not following the program. She is not drinking enough water.   Subjective:   1. Prediabetes [name] is taking Miralax for constipation. She will go back to stool softener. She is currently taking Mounjaro.  2. Vitamin D deficiency [name] is taking Vitamin D as directed.  Assessment/Plan:   1. Prediabetes [name] will continue to work on weight loss, exercise, and decreasing simple carbohydrates to help decrease the risk of diabetes. We will refill Mounjaro 5 mg weekly with no refills.  - tirzepatide [facility]) 5 MG/0.5ML Pen; Inject 5 mg into the skin once a week.  Dispense: 2 mL; Refill: 0  2. Vitamin D deficiency Low Vitamin D level contributes to fatigue and are associated with obesity, breast, and colon cancer. We will refill prescription Vitamin D 50,000 IU every week for 1 month with no refills and [name] will follow-up for routine testing of Vitamin D, at least 2-3 times per year to avoid over-replacement.  - Vitamin D, Ergocalciferol, (DRISDOL) 1.25 MG (50000 UNIT) CAPS capsule; Take 1 capsule (50,000 Units total) by mouth every 7 (seven) days.  Dispense: 4 capsule; Refill: 0  3. Obesity with current BMI of 41 [name] is currently in the action stage of change. As such, her goal is to continue with weight loss efforts. She has agreed to keeping a food journal and adhering to recommended goals of  1400-1500 calories and 80-90 grams of protein.   [name] will continue meal planning. She will be mindful eating. She will increase her water intake.  Exercise goals: No exercise has been prescribed at this time.  Behavioral modification strategies: increasing lean protein intake, decreasing simple carbohydrates, increasing vegetables, increasing water intake, decreasing eating out, no skipping meals, meal planning and cooking strategies, keeping healthy foods in the home, and planning for success.  [name] has agreed to follow-up with our clinic in 2-3 weeks with Jake Bathe, FNP or Dr. Raliegh Scarlet. She was informed of the importance of frequent follow-up visits to maximize her success with intensive lifestyle modifications for her multiple health conditions.   Objective:   Pulse 89, temperature (!) 97.5 F (36.4 C), height 5\' 8"  (1.727 m), weight 269 lb (122 kg), SpO2 98 %. Body mass index is 40.9 kg/m.  General: Cooperative, alert, well developed, in no acute distress. HEENT: Conjunctivae and lids unremarkable. Cardiovascular: Regular rhythm.  Lungs: Normal work of breathing. Neurologic: No focal deficits.   Lab Results  Component Value Date   CREATININE 0.74 04/17/2021   BUN 15 04/17/2021   NA 143 04/17/2021   K 4.4 04/17/2021   CL 104 04/17/2021   CO2 26 04/17/2021   Lab Results  Component Value Date   ALT 16 04/17/2021   AST 13 04/17/2021   ALKPHOS 72 04/17/2021   BILITOT 0.3 04/17/2021   Lab Results  Component Value Date   HGBA1C 5.8 (H) 04/17/2021  Lab Results  Component Value Date   INSULIN 18.6 04/17/2021   Lab Results  Component Value Date   TSH 4.240 04/17/2021   Lab Results  Component Value Date   CHOL 215 (H) 04/17/2021   HDL 54 04/17/2021   LDLCALC 141 (H) 04/17/2021   TRIG 110 04/17/2021   CHOLHDL 4.0 11/01/2013   Lab Results  Component Value Date   VD25OH 25.2 (L) 04/17/2021   Lab Results  Component Value Date   WBC 6.1 04/17/2021   HGB 13.2  04/17/2021   HCT 41.7 04/17/2021   MCV 91 04/17/2021   PLT 245 04/17/2021   No results found for: IRON, TIBC, FERRITIN  Attestation Statements:   Reviewed by clinician on day of visit: allergies, medications, problem list, medical history, surgical history, family history, social history, and previous encounter notes.  I, Lizbeth Bark, RMA, am acting as Location manager for [employer], [place].   I have reviewed the above documentation for accuracy and completeness, and I agree with the above. Jearld Lesch, DO

## 2021-06-29 ENCOUNTER — Other Ambulatory Visit (INDEPENDENT_AMBULATORY_CARE_PROVIDER_SITE_OTHER): Payer: Self-pay | Admitting: Family Medicine

## 2021-06-29 DIAGNOSIS — E559 Vitamin D deficiency, unspecified: Secondary | ICD-10-CM

## 2021-07-08 ENCOUNTER — Other Ambulatory Visit (INDEPENDENT_AMBULATORY_CARE_PROVIDER_SITE_OTHER): Payer: Self-pay | Admitting: Family Medicine

## 2021-07-08 ENCOUNTER — Encounter (INDEPENDENT_AMBULATORY_CARE_PROVIDER_SITE_OTHER): Payer: Self-pay | Admitting: Family Medicine

## 2021-07-08 ENCOUNTER — Other Ambulatory Visit: Payer: Self-pay

## 2021-07-08 ENCOUNTER — Ambulatory Visit (INDEPENDENT_AMBULATORY_CARE_PROVIDER_SITE_OTHER): Payer: 59 | Admitting: Family Medicine

## 2021-07-08 VITALS — BP 101/69 | HR 79 | Temp 97.9°F | Ht 68.0 in | Wt 261.0 lb

## 2021-07-08 DIAGNOSIS — R7303 Prediabetes: Secondary | ICD-10-CM

## 2021-07-08 DIAGNOSIS — E559 Vitamin D deficiency, unspecified: Secondary | ICD-10-CM

## 2021-07-08 DIAGNOSIS — Z6841 Body Mass Index (BMI) 40.0 and over, adult: Secondary | ICD-10-CM | POA: Diagnosis not present

## 2021-07-08 MED ORDER — TIRZEPATIDE 7.5 MG/0.5ML ~~LOC~~ SOAJ: 7.5000 mg | SUBCUTANEOUS | 0 refills | Status: DC

## 2021-07-08 NOTE — Telephone Encounter (Signed)
Saw Lori Montgomery today

## 2021-07-08 NOTE — Progress Notes (Signed)
Chief Complaint:   OBESITY [name] is here to discuss her progress with her obesity treatment plan along with follow-up of her obesity related diagnoses. [name] is on the Category 3 Plan and keeping a food journal and adhering to recommended goals of 1400-1500 calories and 80-90 grams of protein and states she is following her eating plan approximately 50% of the time. [name] states she is walking for 20 minutes 2 times per week.  Today's visit was #: 7 Starting weight: 290 lbs  Starting date: 04/17/2021 Today's weight: 261 lbs Today's date: 07/08/2021 Total lbs lost to date: 29 lbs Total lbs lost since last in-office visit: 8 lbs  Interim History: [name] notes Thanksgiving went very well. She is following Category 3 vs journaling. She is very happy with her progress since starting our program.  Subjective:   1. Prediabetes Lori Montgomery is tolerating 5 mg Mounjaro well. However, she notes she is snacking more recently.  Lab Results  Component Value Date   HGBA1C 5.8 (H) 04/17/2021   Lab Results  Component Value Date   INSULIN 18.6 04/17/2021    2. Vitamin D deficiency Lori Montgomery's Vitamin D is not at goal. She is on prescription Vitamin D.  Lab Results  Component Value Date   VD25OH 25.2 (L) 04/17/2021    Assessment/Plan:   1. Prediabetes Increase dose of Mounjaro. We will refill Mounjaro 7.5 mg weekly.  - tirzepatide (MOUNJARO) 7.5 MG/0.5ML Pen; Inject 7.5 mg into the skin once a week.  Dispense: 6 mL; Refill: 0  2. Vitamin D deficiency Lori Montgomery agrees to continue to take prescription Vitamin D 50,000 IU every week and she will follow-up for routine testing of Vitamin D, at least 2-3 times per year to avoid over-replacement.  3. Obesity with current BMI of 39.69 Lori Montgomery is currently in the action stage of change. As such, her goal is to continue with weight loss efforts. She has agreed to the Category 3 Plan.   Exercise goals:  As is.  Behavioral modification strategies: planning for  success.  [name] has agreed to follow-up with our clinic in 2 weeks.    Objective:   Blood pressure 101/69, pulse 79, temperature 97.9 F (36.6 C), height 5\' 8"  (1.727 m), weight 261 lb (118.4 kg), SpO2 98 %. Body mass index is 39.68 kg/m.  General: Cooperative, alert, well developed, in no acute distress. HEENT: Conjunctivae and lids unremarkable. Cardiovascular: Regular rhythm.  Lungs: Normal work of breathing. Neurologic: No focal deficits.   Lab Results  Component Value Date   CREATININE 0.74 04/17/2021   BUN 15 04/17/2021   NA 143 04/17/2021   K 4.4 04/17/2021   CL 104 04/17/2021   CO2 26 04/17/2021   Lab Results  Component Value Date   ALT 16 04/17/2021   AST 13 04/17/2021   ALKPHOS 72 04/17/2021   BILITOT 0.3 04/17/2021   Lab Results  Component Value Date   HGBA1C 5.8 (H) 04/17/2021   Lab Results  Component Value Date   INSULIN 18.6 04/17/2021   Lab Results  Component Value Date   TSH 4.240 04/17/2021   Lab Results  Component Value Date   CHOL 215 (H) 04/17/2021   HDL 54 04/17/2021   LDLCALC 141 (H) 04/17/2021   TRIG 110 04/17/2021   CHOLHDL 4.0 11/01/2013   Lab Results  Component Value Date   VD25OH 25.2 (L) 04/17/2021   Lab Results  Component Value Date   WBC 6.1 04/17/2021   HGB 13.2 04/17/2021  HCT 41.7 04/17/2021   MCV 91 04/17/2021   PLT 245 04/17/2021   No results found for: IRON, TIBC, FERRITIN  Attestation Statements:   Reviewed by clinician on day of visit: allergies, medications, problem list, medical history, surgical history, family history, social history, and previous encounter notes.  I, Lizbeth Bark, RMA, am acting as Location manager for [employer], [place].   I have reviewed the above documentation for accuracy and completeness, and I agree with the above. -  Georgianne Fick, FNP

## 2021-07-08 NOTE — Telephone Encounter (Signed)
Pt called in and stated that her pharmacy isnt covering the discount due to her not having type 2 diabetes. The pt is requesting something else sent in and a call back. Please advise

## 2021-07-08 NOTE — Telephone Encounter (Signed)
Pt last seen by Dawn [name], FNP.  

## 2021-07-10 ENCOUNTER — Telehealth (INDEPENDENT_AMBULATORY_CARE_PROVIDER_SITE_OTHER): Payer: Self-pay | Admitting: Family Medicine

## 2021-07-10 ENCOUNTER — Encounter (INDEPENDENT_AMBULATORY_CARE_PROVIDER_SITE_OTHER): Payer: Self-pay

## 2021-07-10 NOTE — Telephone Encounter (Signed)
Prior authorization denied for Mounjaro. Patient sent mychart message.  

## 2021-07-17 ENCOUNTER — Other Ambulatory Visit (INDEPENDENT_AMBULATORY_CARE_PROVIDER_SITE_OTHER): Payer: Self-pay | Admitting: Bariatrics

## 2021-07-17 DIAGNOSIS — E559 Vitamin D deficiency, unspecified: Secondary | ICD-10-CM

## 2021-07-22 ENCOUNTER — Encounter (INDEPENDENT_AMBULATORY_CARE_PROVIDER_SITE_OTHER): Payer: Self-pay | Admitting: Family Medicine

## 2021-07-22 ENCOUNTER — Ambulatory Visit (INDEPENDENT_AMBULATORY_CARE_PROVIDER_SITE_OTHER): Payer: 59 | Admitting: Family Medicine

## 2021-07-22 ENCOUNTER — Other Ambulatory Visit: Payer: Self-pay

## 2021-07-22 VITALS — BP 112/75 | HR 81 | Temp 97.5°F | Ht 68.0 in | Wt 260.0 lb

## 2021-07-22 DIAGNOSIS — E559 Vitamin D deficiency, unspecified: Secondary | ICD-10-CM | POA: Diagnosis not present

## 2021-07-22 DIAGNOSIS — Z6841 Body Mass Index (BMI) 40.0 and over, adult: Secondary | ICD-10-CM | POA: Diagnosis not present

## 2021-07-22 DIAGNOSIS — R7303 Prediabetes: Secondary | ICD-10-CM

## 2021-07-22 MED ORDER — TIRZEPATIDE 10 MG/0.5ML ~~LOC~~ SOAJ: 10.0000 mg | SUBCUTANEOUS | 0 refills | Status: DC

## 2021-07-23 ENCOUNTER — Encounter (INDEPENDENT_AMBULATORY_CARE_PROVIDER_SITE_OTHER): Payer: Self-pay | Admitting: Family Medicine

## 2021-07-23 NOTE — Progress Notes (Signed)
Chief Complaint:   OBESITY Lori Montgomery is here to discuss her progress with her obesity treatment plan along with follow-up of her obesity related diagnoses. Lori Montgomery is on the Category 3 Plan and states she is following her eating plan approximately 50% of the time. Lori Montgomery states she is doing 0 minutes 0 times per week.  Today's visit was #: 8 Starting weight: 290 lbs Starting date: 04/17/2021 Today's weight: 260 lbs Today's date: 07/22/2021 Total lbs lost to date: 30 lbs Total lbs lost since last in-office visit: 1 lb  Interim History: Lori Montgomery notes she "fell off the wagon" recently due to a trip to a wedding. However, she has lost a pound since last OV. She will be traveling for work soon and tends to travel a lot.  She has made great progress overall and is down 30 lbs over 3 months.  Subjective:   1. Pre-diabetes Lori Montgomery's A1C is elevated at 5.8. She is on Mounjaro 7.5 mg weekly without side effects.  Lab Results  Component Value Date   HGBA1C 5.8 (H) 04/17/2021   Lab Results  Component Value Date   INSULIN 18.6 04/17/2021    2. Vitamin D deficiency Lori Montgomery's Vitamin D is low at 25.2. She is on weekly prescription Vitamin D.   Lab Results  Component Value Date   VD25OH 25.2 (L) 04/17/2021    Assessment/Plan:   1. Pre-diabetes Lori Montgomery agrees to increase dose of Mounjaro from 7.5 mg to 10.mg weekly. She will continue to work on weight loss, exercise, and decreasing simple carbohydrates to help decrease the risk of diabetes.   - tirzepatide (MOUNJARO) 10 MG/0.5ML Pen; Inject 10 mg into the skin once a week.  Dispense: 2 mL; Refill: 0  2. Vitamin D deficiency Lori Montgomery agrees to continue to take prescription Vitamin D 50,000 IU every week and she will follow-up for routine testing of Vitamin D, at least 2-3 times per year to avoid over-replacement.  3. Obesity with current BMI of 39.54 Lori Montgomery is currently in the action stage of change. As such, her goal is to continue with weight loss  efforts. She has agreed to the Category 3 Plan.   Exercise goals: No exercise has been prescribed at this time.  Behavioral modification strategies: decreasing simple carbohydrates and travel eating strategies.  Lori Montgomery has agreed to follow-up with our clinic in 2-3 weeks. She was informed of the importance of frequent follow-up visits to maximize her success with intensive lifestyle modifications for her multiple health conditions.   Objective:   Blood pressure 112/75, pulse 81, temperature (!) 97.5 F (36.4 C), height 5\' 8"  (1.727 m), weight 260 lb (117.9 kg), SpO2 98 %. Body mass index is 39.53 kg/m.  General: Cooperative, alert, well developed, in no acute distress. HEENT: Conjunctivae and lids unremarkable. Cardiovascular: Regular rhythm.  Lungs: Normal work of breathing. Neurologic: No focal deficits.   Lab Results  Component Value Date   CREATININE 0.74 04/17/2021   BUN 15 04/17/2021   NA 143 04/17/2021   K 4.4 04/17/2021   CL 104 04/17/2021   CO2 26 04/17/2021   Lab Results  Component Value Date   ALT 16 04/17/2021   AST 13 04/17/2021   ALKPHOS 72 04/17/2021   BILITOT 0.3 04/17/2021   Lab Results  Component Value Date   HGBA1C 5.8 (H) 04/17/2021   Lab Results  Component Value Date   INSULIN 18.6 04/17/2021   Lab Results  Component Value Date   TSH 4.240 04/17/2021  Lab Results  Component Value Date   CHOL 215 (H) 04/17/2021   HDL 54 04/17/2021   LDLCALC 141 (H) 04/17/2021   TRIG 110 04/17/2021   CHOLHDL 4.0 11/01/2013   Lab Results  Component Value Date   VD25OH 25.2 (L) 04/17/2021   Lab Results  Component Value Date   WBC 6.1 04/17/2021   HGB 13.2 04/17/2021   HCT 41.7 04/17/2021   MCV 91 04/17/2021   PLT 245 04/17/2021   No results found for: IRON, TIBC, FERRITIN  Attestation Statements:   Reviewed by clinician on day of visit: allergies, medications, problem list, medical history, surgical history, family history, social history, and  previous encounter notes.  I, Lori Montgomery, RMA, am acting as Location manager for [employer], [place].   I have reviewed the above documentation for accuracy and completeness, and I agree with the above. -  Georgianne Fick, FNP

## 2021-08-04 ENCOUNTER — Other Ambulatory Visit (INDEPENDENT_AMBULATORY_CARE_PROVIDER_SITE_OTHER): Payer: Self-pay | Admitting: Family Medicine

## 2021-08-04 DIAGNOSIS — R7303 Prediabetes: Secondary | ICD-10-CM

## 2021-08-12 ENCOUNTER — Other Ambulatory Visit: Payer: Self-pay

## 2021-08-12 ENCOUNTER — Encounter (INDEPENDENT_AMBULATORY_CARE_PROVIDER_SITE_OTHER): Payer: Self-pay | Admitting: Family Medicine

## 2021-08-12 ENCOUNTER — Ambulatory Visit (INDEPENDENT_AMBULATORY_CARE_PROVIDER_SITE_OTHER): Payer: 59 | Admitting: Family Medicine

## 2021-08-12 VITALS — BP 90/62 | HR 90 | Temp 97.8°F | Ht 68.0 in | Wt 254.0 lb

## 2021-08-12 DIAGNOSIS — R7303 Prediabetes: Secondary | ICD-10-CM

## 2021-08-12 DIAGNOSIS — I10 Essential (primary) hypertension: Secondary | ICD-10-CM | POA: Diagnosis not present

## 2021-08-12 DIAGNOSIS — Z9189 Other specified personal risk factors, not elsewhere classified: Secondary | ICD-10-CM

## 2021-08-12 DIAGNOSIS — Z6841 Body Mass Index (BMI) 40.0 and over, adult: Secondary | ICD-10-CM | POA: Diagnosis not present

## 2021-08-12 MED ORDER — TIRZEPATIDE 10 MG/0.5ML ~~LOC~~ SOAJ: 10.0000 mg | SUBCUTANEOUS | 0 refills | Status: DC

## 2021-08-13 ENCOUNTER — Ambulatory Visit (INDEPENDENT_AMBULATORY_CARE_PROVIDER_SITE_OTHER): Payer: 59 | Admitting: Family Medicine

## 2021-08-13 NOTE — Progress Notes (Signed)
Chief Complaint:   OBESITY Lori Montgomery is here to discuss her progress with her obesity treatment plan along with follow-up of her obesity related diagnoses. Lori Montgomery is on the Category 3 Plan and states she is following her eating plan approximately 50% of the time. Lori Montgomery states she is doing 0 minutes 0 times per week.  Today's visit was #: 9 Starting weight: 290 lbs Starting date: 04/17/2021 Today's weight: 254 lbs Today's date: 08/12/2021 Total lbs lost to date: 36 Total lbs lost since last in-office visit: 6  Interim History: Lori Montgomery continues to do very well with weight loss even over the holidays. Her hunger is controlled but she is deviating from her plan more. She is open to looking at other eating plan options.  Subjective:   1. Pre-diabetes Lori Montgomery is doing well on Mounjaro. She notes decreased polyphagia, but she has some minor constipation.  2. Essential hypertension Lori Montgomery's blood pressure is low today, and she notes feeling a bit lightheaded. She has not drank much water lately, and she feels she may be dehydrated.  3. At risk for dehydration Lori Montgomery is at risk for dehydration due to inadequate water intake.  Assessment/Plan:   1. Pre-diabetes We will refill Mounjaro for 1 month. Lori Montgomery will increase her water intake and stool softener as needed. She will continue to work on weight loss, exercise, and decreasing simple carbohydrates to help decrease the risk of diabetes.   - tirzepatide (MOUNJARO) 10 MG/0.5ML Pen; Inject 10 mg into the skin once a week.  Dispense: 2 mL; Refill: 0  2. Essential hypertension Lori Montgomery is to increase her water intake and contact her primary care provider to discuss adjusting her medications, especially her diuretics if appropriate. We will watch for signs of hypotension as she continues her lifestyle modifications.  3. At risk for dehydration Lori Montgomery was given approximately 15 minutes dehydration prevention counseling today. Lori Montgomery is at risk for dehydration due to  weight loss and current medication(s). She was encouraged to hydrate and monitor fluid status to avoid dehydration as well as weight loss plateaus.   4. Obesity with current BMI of 38.7 Lori Montgomery is currently in the action stage of change. As such, her goal is to continue with weight loss efforts. She has agreed to change to keeping a food journal and adhering to recommended goals of 1300-1600 calories and 90+ grams of protein daily.   Behavioral modification strategies: increasing lean protein intake, increasing water intake, and meal planning and cooking strategies.  Lori Montgomery has agreed to follow-up with our clinic in 3 weeks. She was informed of the importance of frequent follow-up visits to maximize her success with intensive lifestyle modifications for her multiple health conditions.   Objective:   Blood pressure 90/62, pulse 90, temperature 97.8 F (36.6 C), temperature source Oral, height 5\' 8"  (1.727 m), weight 254 lb (115.2 kg), SpO2 98 %. Body mass index is 38.62 kg/m.  General: Cooperative, alert, well developed, in no acute distress. HEENT: Conjunctivae and lids unremarkable. Cardiovascular: Regular rhythm.  Lungs: Normal work of breathing. Neurologic: No focal deficits.   Lab Results  Component Value Date   CREATININE 0.74 04/17/2021   BUN 15 04/17/2021   NA 143 04/17/2021   K 4.4 04/17/2021   CL 104 04/17/2021   CO2 26 04/17/2021   Lab Results  Component Value Date   ALT 16 04/17/2021   AST 13 04/17/2021   ALKPHOS 72 04/17/2021   BILITOT 0.3 04/17/2021   Lab Results  Component Value  Date   HGBA1C 5.8 (H) 04/17/2021   Lab Results  Component Value Date   INSULIN 18.6 04/17/2021   Lab Results  Component Value Date   TSH 4.240 04/17/2021   Lab Results  Component Value Date   CHOL 215 (H) 04/17/2021   HDL 54 04/17/2021   LDLCALC 141 (H) 04/17/2021   TRIG 110 04/17/2021   CHOLHDL 4.0 11/01/2013   Lab Results  Component Value Date   VD25OH 25.2 (L) 04/17/2021    Lab Results  Component Value Date   WBC 6.1 04/17/2021   HGB 13.2 04/17/2021   HCT 41.7 04/17/2021   MCV 91 04/17/2021   PLT 245 04/17/2021   No results found for: IRON, TIBC, FERRITIN  Attestation Statements:   Reviewed by clinician on day of visit: allergies, medications, problem list, medical history, surgical history, family history, social history, and previous encounter notes.   I, Trixie Dredge, am acting as transcriptionist for Dennard Nip, MD.  I have reviewed the above documentation for accuracy and completeness, and I agree with the above. -  Dennard Nip, MD

## 2021-08-30 ENCOUNTER — Other Ambulatory Visit (INDEPENDENT_AMBULATORY_CARE_PROVIDER_SITE_OTHER): Payer: Self-pay | Admitting: Family Medicine

## 2021-08-30 DIAGNOSIS — R7303 Prediabetes: Secondary | ICD-10-CM

## 2021-09-01 NOTE — Telephone Encounter (Signed)
Dr.Beasley 

## 2021-09-02 ENCOUNTER — Ambulatory Visit (INDEPENDENT_AMBULATORY_CARE_PROVIDER_SITE_OTHER): Payer: 59 | Admitting: Family Medicine

## 2021-09-02 ENCOUNTER — Encounter (INDEPENDENT_AMBULATORY_CARE_PROVIDER_SITE_OTHER): Payer: Self-pay | Admitting: Family Medicine

## 2021-09-02 ENCOUNTER — Other Ambulatory Visit: Payer: Self-pay

## 2021-09-02 ENCOUNTER — Other Ambulatory Visit (HOSPITAL_COMMUNITY): Payer: Self-pay

## 2021-09-02 VITALS — BP 87/61 | HR 78 | Temp 97.4°F | Ht 68.0 in | Wt 252.0 lb

## 2021-09-02 DIAGNOSIS — E669 Obesity, unspecified: Secondary | ICD-10-CM

## 2021-09-02 DIAGNOSIS — E7849 Other hyperlipidemia: Secondary | ICD-10-CM

## 2021-09-02 DIAGNOSIS — E559 Vitamin D deficiency, unspecified: Secondary | ICD-10-CM

## 2021-09-02 DIAGNOSIS — Z6838 Body mass index (BMI) 38.0-38.9, adult: Secondary | ICD-10-CM

## 2021-09-02 DIAGNOSIS — R7303 Prediabetes: Secondary | ICD-10-CM | POA: Diagnosis not present

## 2021-09-02 MED ORDER — VITAMIN D (ERGOCALCIFEROL) 1.25 MG (50000 UNIT) PO CAPS
50000.0000 [IU] | ORAL_CAPSULE | ORAL | 0 refills | Status: DC
  Filled 2021-09-02: qty 4, 28d supply, fill #0

## 2021-09-02 MED ORDER — TIRZEPATIDE 12.5 MG/0.5ML ~~LOC~~ SOAJ
12.5000 mg | SUBCUTANEOUS | 0 refills | Status: DC
  Filled 2021-09-02: qty 2, 28d supply, fill #0

## 2021-09-02 MED ORDER — VITAMIN D (ERGOCALCIFEROL) 1.25 MG (50000 UNIT) PO CAPS: 50000.0000 [IU] | ORAL_CAPSULE | ORAL | 0 refills | Status: DC

## 2021-09-02 NOTE — Progress Notes (Signed)
Chief Complaint:   OBESITY Lori Montgomery is here to discuss her progress with her obesity treatment plan along with follow-up of her obesity related diagnoses. Lori Montgomery is on keeping a food journal and adhering to recommended goals of 1300-1600 calories and 90 grams of protein and states she is following her eating plan approximately 0% of the time. Lori Montgomery states she is doing 0 minutes 0 times per week.  Today's visit was #: 10 Starting weight: 290 lbs Starting date: 04/17/2021 Today's weight: 252 lbs Today's date: 09/02/2021 Total lbs lost to date: 38 lbs Total lbs lost since last in-office visit: 2 lbs  Interim History: Lori Montgomery is struggling with plan adherence. She feels she needs more structure in her plan. She is not getting in adequate protein. She has not been journaling.  However, she continues to lose weight steadily.   Subjective:   1. Pre-diabetes Lori Montgomery appetite is well controlled on 10 mg Moujaro. She denies side effects.  Lab Results  Component Value Date   HGBA1C 5.8 (H) 04/17/2021   Lab Results  Component Value Date   INSULIN 18.6 04/17/2021    2. Other hyperlipidemia Lori Montgomery's LDL is elevated at 141. Her HDL and Triglycerides were within normal limits. She is not on a statin.  Lab Results  Component Value Date   CHOL 215 (H) 04/17/2021   HDL 54 04/17/2021   LDLCALC 141 (H) 04/17/2021   TRIG 110 04/17/2021   CHOLHDL 4.0 11/01/2013   Lab Results  Component Value Date   ALT 16 04/17/2021   AST 13 04/17/2021   ALKPHOS 72 04/17/2021   BILITOT 0.3 04/17/2021   The ASCVD Risk score (Arnett DK, et al., 2019) failed to calculate for the following reasons:   The valid systolic blood pressure range is 90 to 200 mmHg  3. Vitamin D deficiency Lori Montgomery's Vitamin D is low at 25.2. She is on weekly prescription Vitamin D.  Lab Results  Component Value Date   VD25OH 25.2 (L) 04/17/2021    Assessment/Plan:   1. Pre-diabetes Lori Montgomery agrees to increase dose Mounjaro 12.5 mg. 10 mg  Mounjaro is not available. She will continue to work on improving the quality of the food that she eats.   - Comprehensive metabolic panel  - Hemoglobin A1c  - Insulin, random  - tirzepatide (MOUNJARO) 12.5 MG/0.5ML Pen; Inject 12.5 mg into the skin once a week.  Dispense: 2 mL; Refill: 0  2. Other hyperlipidemia We will check FLP today.  - Lipid Panel With LDL/HDL Ratio  3. Vitamin D deficiency We will check Vitamin D today. We will refill prescription Vitamin D 50,000 IU every week and Lori Montgomery will follow-up for routine testing of Vitamin D, at least 2-3 times per year to avoid over-replacement.  - VITAMIN D 25 Hydroxy (Vit-D Deficiency, Fractures)  - Vitamin D, Ergocalciferol, (DRISDOL) 1.25 MG (50000 UNIT) CAPS capsule; Take 1 capsule (50,000 Units total) by mouth every 7 (seven) days.  Dispense: 4 capsule; Refill: 0  4. Obesity with current BMI of 38.33 Lori Montgomery is currently in the action stage of change. As such, her goal is to continue with weight loss efforts. She has agreed to the Category 3 Plan.   Exercise goals: No exercise has been prescribed at this time.  Behavioral modification strategies: increasing lean protein intake, decreasing simple carbohydrates, and meal planning and cooking strategies.  Lori Montgomery has agreed to follow-up with our clinic in 3-4 weeks.  Objective:   Blood pressure (!) 87/61, pulse 78, temperature (!)  97.4 F (36.3 C), height 5\' 8"  (1.727 m), weight 252 lb (114.3 kg), SpO2 97 %. Body mass index is 38.32 kg/m.  General: Cooperative, alert, well developed, in no acute distress. HEENT: Conjunctivae and lids unremarkable. Cardiovascular: Regular rhythm.  Lungs: Normal work of breathing. Neurologic: No focal deficits.   Lab Results  Component Value Date   CREATININE 0.74 04/17/2021   BUN 15 04/17/2021   NA 143 04/17/2021   K 4.4 04/17/2021   CL 104 04/17/2021   CO2 26 04/17/2021   Lab Results  Component Value Date   ALT 16 04/17/2021   AST  13 04/17/2021   ALKPHOS 72 04/17/2021   BILITOT 0.3 04/17/2021   Lab Results  Component Value Date   HGBA1C 5.8 (H) 04/17/2021   Lab Results  Component Value Date   INSULIN 18.6 04/17/2021   Lab Results  Component Value Date   TSH 4.240 04/17/2021   Lab Results  Component Value Date   CHOL 215 (H) 04/17/2021   HDL 54 04/17/2021   LDLCALC 141 (H) 04/17/2021   TRIG 110 04/17/2021   CHOLHDL 4.0 11/01/2013   Lab Results  Component Value Date   VD25OH 25.2 (L) 04/17/2021   Lab Results  Component Value Date   WBC 6.1 04/17/2021   HGB 13.2 04/17/2021   HCT 41.7 04/17/2021   MCV 91 04/17/2021   PLT 245 04/17/2021   No results found for: IRON, TIBC, FERRITIN  Attestation Statements:   Reviewed by clinician on day of visit: allergies, medications, problem list, medical history, surgical history, family history, social history, and previous encounter notes.  I, Lori Montgomery, RMA, am acting as Location manager for [employer], [place].  I have reviewed the above documentation for accuracy and completeness, and I agree with the above. -  Lori Fick, FNP

## 2021-09-03 DIAGNOSIS — E7849 Other hyperlipidemia: Secondary | ICD-10-CM | POA: Insufficient documentation

## 2021-09-03 LAB — LIPID PANEL WITH LDL/HDL RATIO
Cholesterol, Total: 176 mg/dL (ref 100–199)
HDL: 46 mg/dL (ref >39)
LDL Chol Calc (NIH): 115 mg/dL — ABNORMAL HIGH (ref 0–99)
LDL/HDL Ratio: 2.5 ratio (ref 0.0–3.2)
Triglycerides: 83 mg/dL (ref 0–149)
VLDL Cholesterol Cal: 15 mg/dL (ref 5–40)

## 2021-09-03 LAB — VITAMIN D 25 HYDROXY (VIT D DEFICIENCY, FRACTURES): Vit D, 25-Hydroxy: 39.5 ng/mL (ref 30.0–100.0)

## 2021-09-03 LAB — COMPREHENSIVE METABOLIC PANEL
ALT: 13 IU/L (ref 0–32)
AST: 19 IU/L (ref 0–40)
Albumin/Globulin Ratio: 1.7 (ref 1.2–2.2)
Albumin: 4.3 g/dL (ref 3.8–4.9)
Alkaline Phosphatase: 82 IU/L (ref 44–121)
BUN/Creatinine Ratio: 17 (ref 9–23)
BUN: 13 mg/dL (ref 6–24)
Bilirubin Total: 0.4 mg/dL (ref 0.0–1.2)
CO2: 24 mmol/L (ref 20–29)
Calcium: 9.6 mg/dL (ref 8.7–10.2)
Chloride: 104 mmol/L (ref 96–106)
Creatinine, Ser: 0.77 mg/dL (ref 0.57–1.00)
Globulin, Total: 2.6 g/dL (ref 1.5–4.5)
Glucose: 81 mg/dL (ref 70–99)
Potassium: 4.3 mmol/L (ref 3.5–5.2)
Sodium: 142 mmol/L (ref 134–144)
Total Protein: 6.9 g/dL (ref 6.0–8.5)
eGFR: 93 mL/min/{1.73_m2} (ref >59)

## 2021-09-03 LAB — INSULIN, RANDOM: INSULIN: 8.2 u[IU]/mL (ref 2.6–24.9)

## 2021-09-03 LAB — HEMOGLOBIN A1C
Est. average glucose Bld gHb Est-mCnc: 114 mg/dL
Hgb A1c MFr Bld: 5.6 % (ref 4.8–5.6)

## 2021-09-15 ENCOUNTER — Other Ambulatory Visit: Payer: Self-pay | Admitting: Obstetrics and Gynecology

## 2021-09-15 DIAGNOSIS — Z9189 Other specified personal risk factors, not elsewhere classified: Secondary | ICD-10-CM

## 2021-09-16 NOTE — Telephone Encounter (Signed)
Can you please close this message out? I am not able to close it. Patient therapy was discontinued  Lori Montgomery). Thanks!

## 2021-09-23 ENCOUNTER — Ambulatory Visit (INDEPENDENT_AMBULATORY_CARE_PROVIDER_SITE_OTHER): Payer: 59 | Admitting: Family Medicine

## 2021-09-23 ENCOUNTER — Other Ambulatory Visit: Payer: Self-pay

## 2021-09-23 ENCOUNTER — Other Ambulatory Visit (HOSPITAL_COMMUNITY): Payer: Self-pay

## 2021-09-23 ENCOUNTER — Encounter (INDEPENDENT_AMBULATORY_CARE_PROVIDER_SITE_OTHER): Payer: Self-pay | Admitting: Family Medicine

## 2021-09-23 VITALS — BP 105/72 | HR 88 | Temp 97.7°F | Ht 68.0 in | Wt 246.0 lb

## 2021-09-23 DIAGNOSIS — Z6837 Body mass index (BMI) 37.0-37.9, adult: Secondary | ICD-10-CM

## 2021-09-23 DIAGNOSIS — R7303 Prediabetes: Secondary | ICD-10-CM

## 2021-09-23 DIAGNOSIS — Z9189 Other specified personal risk factors, not elsewhere classified: Secondary | ICD-10-CM

## 2021-09-23 DIAGNOSIS — E669 Obesity, unspecified: Secondary | ICD-10-CM

## 2021-09-23 DIAGNOSIS — Z6841 Body Mass Index (BMI) 40.0 and over, adult: Secondary | ICD-10-CM

## 2021-09-23 DIAGNOSIS — E559 Vitamin D deficiency, unspecified: Secondary | ICD-10-CM

## 2021-09-23 DIAGNOSIS — E7849 Other hyperlipidemia: Secondary | ICD-10-CM

## 2021-09-23 MED ORDER — VITAMIN D (ERGOCALCIFEROL) 1.25 MG (50000 UNIT) PO CAPS
50000.0000 [IU] | ORAL_CAPSULE | ORAL | 0 refills | Status: DC
  Filled 2021-09-23: qty 4, 28d supply, fill #0

## 2021-09-23 MED ORDER — TIRZEPATIDE 12.5 MG/0.5ML ~~LOC~~ SOAJ
12.5000 mg | SUBCUTANEOUS | 0 refills | Status: DC
  Filled 2021-09-23: qty 2, 28d supply, fill #0

## 2021-09-23 NOTE — Progress Notes (Signed)
Chief Complaint:   OBESITY Lori Montgomery is here to discuss her progress with her obesity treatment plan along with follow-up of her obesity related diagnoses. Lori Montgomery is on the Category 3 Plan and states she is following her eating plan approximately 50% of the time. Lori Montgomery states she is doing 0 minutes 0 times per week.  Today's visit was #: 10 Starting weight: 290 lbs Starting date: 04/17/2021 Today's weight: 246 lbs Today's date: 09/23/2021 Total lbs lost to date: 44 Total lbs lost since last in-office visit: 6  Interim History: Lori Montgomery continues to do well with weight loss. She travels frequently and she continues to make healthy choices while traveling. She is struggling with exercising due to her busy work schedule.  Subjective:   1. Pre-diabetes Lori Montgomery is taking Mounjaro 12.5 mg, and she denies side effects. Last A1c looked better. I discussed labs with the patient today.  2. Vitamin D deficiency Lori Montgomery is taking Vitamin D 50,000 IU  weekly, and she denies side effects. Last Vit D level looked better at 39.5. I discussed labs with the patient today.  3. Other hyperlipidemia Lori Montgomery's last cholesterol looked better with diet and weight loss. I discussed labs with the patient today.  4. At risk for diabetes mellitus Lori Montgomery is at higher than average risk for developing diabetes due to her obesity.  Assessment/Plan:   1. Pre-diabetes We will  refill Mounjaro 12.5 mg for 1 month. Josanne will continue working on dietary changes, exercise, and weight loss to help decrease the risk of diabetes.   - tirzepatide (MOUNJARO) 12.5 MG/0.5ML Pen; Inject 12.5 mg into the skin once a week.  Dispense: 2 mL; Refill: 0  2. Vitamin D deficiency Low Vitamin D level contributes to fatigue and are associated with obesity, breast, and colon cancer. We will refill prescription Vitamin D 50,000 IU every week and will follow-up for routine testing of Vitamin D, at least 2-3 times per year to avoid over-replacement.  -  Vitamin D, Ergocalciferol, (DRISDOL) 1.25 MG (50000 UNIT) CAPS capsule; Take 1 capsule (50,000 Units total) by mouth every 7 (seven) days.  Dispense: 4 capsule; Refill: 0  3. Other hyperlipidemia Cardiovascular risk and specific lipid/LDL goals reviewed. We discussed several lifestyle modifications today. Lori Montgomery will continue working on dietary changes, exercise, and weight loss. Orders and follow up as documented in patient record.   4. At risk for diabetes mellitus Lori Montgomery was given approximately 15 minutes of diabetic education and counseling today. We discussed intensive lifestyle modifications today with an emphasis on weight loss as well as increasing exercise and decreasing simple carbohydrates in her diet. We also reviewed medication options with an emphasis on risk versus benefits of those discussed.  Repetitive spaced learning was employed today to elicit superior memory formation and behavioral change.  5. Obesity with current BMI of 37.5 Lori Montgomery is currently in the action stage of change. As such, her goal is to continue with weight loss efforts. She has agreed to the Category 3 Plan or keeping a food journal and adhering to recommended goals of 1300-1600 calories and 90+ grams of protein daily.   Exercise goals: All adults should avoid inactivity. Some physical activity is better than none, and adults who participate in any amount of physical activity gain some health benefits.  Behavioral modification strategies: increasing lean protein intake, increasing water intake, no skipping meals, and meal planning and cooking strategies.  [name] has agreed to follow-up with our clinic in 3 weeks. She was informed of  the importance of frequent follow-up visits to maximize her success with intensive lifestyle modifications for her multiple health conditions.   Objective:   Blood pressure 105/72, pulse 88, temperature 97.7 F (36.5 C), height 5\' 8"  (1.727 m), weight 246 lb (111.6 kg), SpO2 97 %. Body  mass index is 37.4 kg/m.  General: Cooperative, alert, well developed, in no acute distress. HEENT: Conjunctivae and lids unremarkable. Cardiovascular: Regular rhythm.  Lungs: Normal work of breathing. Neurologic: No focal deficits.   Lab Results  Component Value Date   CREATININE 0.77 09/02/2021   BUN 13 09/02/2021   NA 142 09/02/2021   K 4.3 09/02/2021   CL 104 09/02/2021   CO2 24 09/02/2021   Lab Results  Component Value Date   ALT 13 09/02/2021   AST 19 09/02/2021   ALKPHOS 82 09/02/2021   BILITOT 0.4 09/02/2021   Lab Results  Component Value Date   HGBA1C 5.6 09/02/2021   HGBA1C 5.8 (H) 04/17/2021   Lab Results  Component Value Date   INSULIN 8.2 09/02/2021   INSULIN 18.6 04/17/2021   Lab Results  Component Value Date   TSH 4.240 04/17/2021   Lab Results  Component Value Date   CHOL 176 09/02/2021   HDL 46 09/02/2021   LDLCALC 115 (H) 09/02/2021   TRIG 83 09/02/2021   CHOLHDL 4.0 11/01/2013   Lab Results  Component Value Date   VD25OH 39.5 09/02/2021   VD25OH 25.2 (L) 04/17/2021   Lab Results  Component Value Date   WBC 6.1 04/17/2021   HGB 13.2 04/17/2021   HCT 41.7 04/17/2021   MCV 91 04/17/2021   PLT 245 04/17/2021   No results found for: IRON, TIBC, FERRITIN  Attestation Statements:   Reviewed by clinician on day of visit: allergies, medications, problem list, medical history, surgical history, family history, social history, and previous encounter notes.   I, Trixie Dredge, am acting as transcriptionist for Dennard Nip, MD.  I have reviewed the above documentation for accuracy and completeness, and I agree with the above. -  Dennard Nip, MD

## 2021-09-24 ENCOUNTER — Other Ambulatory Visit (HOSPITAL_COMMUNITY): Payer: Self-pay

## 2021-09-26 ENCOUNTER — Other Ambulatory Visit (HOSPITAL_COMMUNITY): Payer: Self-pay

## 2021-10-21 ENCOUNTER — Ambulatory Visit (INDEPENDENT_AMBULATORY_CARE_PROVIDER_SITE_OTHER): Payer: 59 | Admitting: Family Medicine

## 2021-10-21 ENCOUNTER — Other Ambulatory Visit (HOSPITAL_COMMUNITY): Payer: Self-pay

## 2021-10-21 ENCOUNTER — Other Ambulatory Visit: Payer: Self-pay

## 2021-10-21 ENCOUNTER — Encounter (INDEPENDENT_AMBULATORY_CARE_PROVIDER_SITE_OTHER): Payer: Self-pay | Admitting: Family Medicine

## 2021-10-21 VITALS — BP 113/77 | HR 81 | Temp 97.7°F | Ht 68.0 in | Wt 242.0 lb

## 2021-10-21 DIAGNOSIS — E559 Vitamin D deficiency, unspecified: Secondary | ICD-10-CM | POA: Diagnosis not present

## 2021-10-21 DIAGNOSIS — E669 Obesity, unspecified: Secondary | ICD-10-CM | POA: Diagnosis not present

## 2021-10-21 DIAGNOSIS — Z9189 Other specified personal risk factors, not elsewhere classified: Secondary | ICD-10-CM

## 2021-10-21 DIAGNOSIS — R7303 Prediabetes: Secondary | ICD-10-CM

## 2021-10-21 DIAGNOSIS — Z6836 Body mass index (BMI) 36.0-36.9, adult: Secondary | ICD-10-CM

## 2021-10-21 MED ORDER — TIRZEPATIDE 15 MG/0.5ML ~~LOC~~ SOAJ
15.0000 mg | SUBCUTANEOUS | 0 refills | Status: DC
  Filled 2021-10-21: qty 2, 28d supply, fill #0

## 2021-10-21 MED ORDER — VITAMIN D (ERGOCALCIFEROL) 1.25 MG (50000 UNIT) PO CAPS
50000.0000 [IU] | ORAL_CAPSULE | ORAL | 0 refills | Status: DC
  Filled 2021-10-21: qty 4, 28d supply, fill #0

## 2021-10-22 ENCOUNTER — Other Ambulatory Visit (HOSPITAL_COMMUNITY): Payer: Self-pay

## 2021-10-27 NOTE — Progress Notes (Signed)
? ? ? ?Chief Complaint:  ? ?OBESITY ?Lori Montgomery is here to discuss her progress with her obesity treatment plan along with follow-up of her obesity related diagnoses. Lori Montgomery is on the Category 3 Plan or keeping a food journal and adhering to recommended goals of 1300-1600 calories and 90+ grams of protein daily and states she is following her eating plan approximately 0% of the time. Lori Montgomery states she is doing 0 minutes 0 times per week. ? ?Today's visit was #: 31 ?Starting weight: 290 lbs ?Starting date: 04/17/2021 ?Today's weight: 242 lbs ?Today's date: 10/21/2021 ?Total lbs lost to date: 56 ?Total lbs lost since last in-office visit: 4 ? ?Interim History: Lori Montgomery continues to do well with weight loss. She has some gallstone issues, but she is starting to get back to her normal routine. Her hunger is mostly controlled.  ? ?Subjective:  ? ?1. Pre-diabetes ?Lori Montgomery is doing well with diet and weight loss, but she notes her polyphagia has increased. ? ?2. Vitamin D deficiency ?Lori Montgomery is stable on Vitamin D, and she requests a refill. She denies nausea, vomiting, or muscle weakness. ? ?3. At risk for impaired metabolic function ?Lori Montgomery is at increased risk for impaired metabolic function if calories or protein decreases. ? ?Assessment/Plan:  ? ?1. Pre-diabetes ?Lori Montgomery agreed to increase Mounjaro to 15 mg q week with no refills. She will continue to work on weight loss, exercise, and decreasing simple carbohydrates to help decrease the risk of diabetes.  ? ?- tirzepatide (MOUNJARO) 15 MG/0.5ML Pen; Inject 15 mg into the skin once a week.  Dispense: 2 mL; Refill: 0 ? ?2. Vitamin D deficiency ?We will refill prescription Vitamin D 50,000 IU every week for 1 month. Lori Montgomery will follow-up for routine testing of Vitamin D, at least 2-3 times per year to avoid over-replacement. ? ?- Vitamin D, Ergocalciferol, (DRISDOL) 1.25 MG (50000 UNIT) CAPS capsule; Take 1 capsule (50,000 Units total) by mouth every 7 (seven) days.  Dispense: 4 capsule; Refill:  0 ? ?3. At risk for impaired metabolic function ?Lori Montgomery was given approximately 15 minutes of impaired  metabolic function prevention counseling today. We discussed intensive lifestyle modifications today with an emphasis on specific nutrition and exercise instructions and strategies.  ? ?Repetitive spaced learning was employed today to elicit superior memory formation and behavioral change. ? ?4. Obesity with current BMI of 36.8 ?Lori Montgomery is currently in the action stage of change. As such, her goal is to continue with weight loss efforts. She has agreed to the Category 3 Plan.  ? ?Behavioral modification strategies: increasing lean protein intake, no skipping meals, and meal planning and cooking strategies. ? ?Lori Montgomery has agreed to follow-up with our clinic in 2 to 3 weeks. She was informed of the importance of frequent follow-up visits to maximize her success with intensive lifestyle modifications for her multiple health conditions.  ? ?Objective:  ? ?Blood pressure 113/77, pulse 81, temperature 97.7 ?F (36.5 ?C), height '5\' 8"'$  (1.727 m), weight 242 lb (109.8 kg), SpO2 99 %. ?Body mass index is 36.8 kg/m?. ? ?General: Cooperative, alert, well developed, in no acute distress. ?HEENT: Conjunctivae and lids unremarkable. ?Cardiovascular: Regular rhythm.  ?Lungs: Normal work of breathing. ?Neurologic: No focal deficits.  ? ?Lab Results  ?Component Value Date  ? CREATININE 0.77 09/02/2021  ? BUN 13 09/02/2021  ? NA 142 09/02/2021  ? K 4.3 09/02/2021  ? CL 104 09/02/2021  ? CO2 24 09/02/2021  ? ?Lab Results  ?Component Value Date  ? ALT 13 09/02/2021  ?  AST 19 09/02/2021  ? ALKPHOS 82 09/02/2021  ? BILITOT 0.4 09/02/2021  ? ?Lab Results  ?Component Value Date  ? HGBA1C 5.6 09/02/2021  ? HGBA1C 5.8 (H) 04/17/2021  ? ?Lab Results  ?Component Value Date  ? INSULIN 8.2 09/02/2021  ? INSULIN 18.6 04/17/2021  ? ?Lab Results  ?Component Value Date  ? TSH 4.240 04/17/2021  ? ?Lab Results  ?Component Value Date  ? CHOL 176 09/02/2021  ?  HDL 46 09/02/2021  ? LDLCALC 115 (H) 09/02/2021  ? TRIG 83 09/02/2021  ? CHOLHDL 4.0 11/01/2013  ? ?Lab Results  ?Component Value Date  ? VD25OH 39.5 09/02/2021  ? VD25OH 25.2 (L) 04/17/2021  ? ?Lab Results  ?Component Value Date  ? WBC 6.1 04/17/2021  ? HGB 13.2 04/17/2021  ? HCT 41.7 04/17/2021  ? MCV 91 04/17/2021  ? PLT 245 04/17/2021  ? ?No results found for: IRON, TIBC, FERRITIN ? ?Attestation Statements:  ? ?Reviewed by clinician on day of visit: allergies, medications, problem list, medical history, surgical history, family history, social history, and previous encounter notes. ? ? ?I, Trixie Dredge, am acting as transcriptionist for Dennard Nip, MD. ? ?I have reviewed the above documentation for accuracy and completeness, and I agree with the above. -  Dennard Nip, MD ? ? ?

## 2021-11-11 ENCOUNTER — Ambulatory Visit (INDEPENDENT_AMBULATORY_CARE_PROVIDER_SITE_OTHER): Payer: 59 | Admitting: Nurse Practitioner

## 2021-11-13 ENCOUNTER — Encounter (INDEPENDENT_AMBULATORY_CARE_PROVIDER_SITE_OTHER): Payer: Self-pay | Admitting: Nurse Practitioner

## 2021-11-13 ENCOUNTER — Ambulatory Visit (INDEPENDENT_AMBULATORY_CARE_PROVIDER_SITE_OTHER): Payer: 59 | Admitting: Nurse Practitioner

## 2021-11-13 ENCOUNTER — Other Ambulatory Visit (HOSPITAL_COMMUNITY): Payer: Self-pay

## 2021-11-13 VITALS — BP 110/75 | HR 71 | Temp 97.6°F | Ht 68.0 in | Wt 238.0 lb

## 2021-11-13 DIAGNOSIS — E559 Vitamin D deficiency, unspecified: Secondary | ICD-10-CM | POA: Diagnosis not present

## 2021-11-13 DIAGNOSIS — R7303 Prediabetes: Secondary | ICD-10-CM | POA: Diagnosis not present

## 2021-11-13 DIAGNOSIS — Z6836 Body mass index (BMI) 36.0-36.9, adult: Secondary | ICD-10-CM

## 2021-11-13 DIAGNOSIS — E669 Obesity, unspecified: Secondary | ICD-10-CM | POA: Diagnosis not present

## 2021-11-13 MED ORDER — TIRZEPATIDE 15 MG/0.5ML ~~LOC~~ SOAJ
15.0000 mg | SUBCUTANEOUS | 0 refills | Status: DC
  Filled 2021-11-13: qty 2, 28d supply, fill #0

## 2021-11-14 NOTE — Progress Notes (Signed)
? ? ? ?Chief Complaint:  ? ?OBESITY ?Lori Montgomery is here to discuss her progress with her obesity treatment plan along with follow-up of her obesity related diagnoses. Stanley is on the Category 3 Plan and states she is following her eating plan approximately 50% of the time. Saadiya states she is walking 120 minutes 4 times per week. ? ?Today's visit was #: 12 ?Starting weight: 290 lbs ?Starting date: 04/17/2021 ?Today's weight: 238 lbs ?Today's date: 11/13/2021 ?Total lbs lost to date: 38 ?Total lbs lost since last in-office visit: 4 ? ?Interim History: [name] has done well with weight loss since her last visit. Her hunger and cravings are under control. She travels frequently for her job and continues to do well. ? ?Subjective:  ? ?1. Pre-diabetes ?[name]'s appetite is controlled on Mounjaro 15 mg. Denies any side effects. ? ?2. Vitamin D deficiency ?[name] is taking Vit D 50,000 IU every other week. Denies any nausea, vomiting or muscle weakness. ? ?Assessment/Plan:  ? ?1. Pre-diabetes ?Shavona will continue to work on weight loss, exercise, and decreasing simple carbohydrates to help decrease the risk of diabetes.   ? ?We will refill Mounjaro 15 mg for 1 month with no refills. ? ?-Refills tirzepatide (MOUNJARO) 15 MG/0.5ML Pen; Inject 15 mg into the skin once a week.  Dispense: 2 mL; Refill: 0 ? ?2. Vitamin D deficiency ?Nashika will continue Vit D as directed. ? ?3. Obesity with current BMI of 36.3 ?[name] is currently in the action stage of change. As such, her goal is to continue with weight loss efforts. She has agreed to the Category 3 Plan.  ? ?Exercise goals: As is. ? ?Behavioral modification strategies: increasing lean protein intake, increasing water intake, no skipping meals, and travel eating strategies. ? ?[name] has agreed to follow-up with our clinic in 2 weeks. She was informed of the importance of frequent follow-up visits to maximize her success with intensive lifestyle modifications for her multiple health conditions.   ? ?Objective:  ? ?Blood pressure 110/75, pulse 71, temperature 97.6 ?F (36.4 ?C), SpO2 99 %. ?There is no height or weight on file to calculate BMI. ? ?General: Cooperative, alert, well developed, in no acute distress. ?HEENT: Conjunctivae and lids unremarkable. ?Cardiovascular: Regular rhythm.  ?Lungs: Normal work of breathing. ?Neurologic: No focal deficits.  ? ?Lab Results  ?Component Value Date  ? CREATININE 0.77 09/02/2021  ? BUN 13 09/02/2021  ? NA 142 09/02/2021  ? K 4.3 09/02/2021  ? CL 104 09/02/2021  ? CO2 24 09/02/2021  ? ?Lab Results  ?Component Value Date  ? ALT 13 09/02/2021  ? AST 19 09/02/2021  ? ALKPHOS 82 09/02/2021  ? BILITOT 0.4 09/02/2021  ? ?Lab Results  ?Component Value Date  ? HGBA1C 5.6 09/02/2021  ? HGBA1C 5.8 (H) 04/17/2021  ? ?Lab Results  ?Component Value Date  ? INSULIN 8.2 09/02/2021  ? INSULIN 18.6 04/17/2021  ? ?Lab Results  ?Component Value Date  ? TSH 4.240 04/17/2021  ? ?Lab Results  ?Component Value Date  ? CHOL 176 09/02/2021  ? HDL 46 09/02/2021  ? LDLCALC 115 (H) 09/02/2021  ? TRIG 83 09/02/2021  ? CHOLHDL 4.0 11/01/2013  ? ?Lab Results  ?Component Value Date  ? VD25OH 39.5 09/02/2021  ? VD25OH 25.2 (L) 04/17/2021  ? ?Lab Results  ?Component Value Date  ? WBC 6.1 04/17/2021  ? HGB 13.2 04/17/2021  ? HCT 41.7 04/17/2021  ? MCV 91 04/17/2021  ? PLT 245 04/17/2021  ? ?No results found  for: IRON, TIBC, FERRITIN ? ?Attestation Statements:  ? ?Reviewed by clinician on day of visit: allergies, medications, problem list, medical history, surgical history, family history, social history, and previous encounter notes. ? ?I, Brendell Tyus, am acting as transcriptionist for Everardo Pacific, FNP.Marland Kitchen ? ?I have reviewed the above documentation for accuracy and completeness, and I agree with the above. Everardo Pacific, FNP  ?

## 2021-11-27 ENCOUNTER — Encounter (INDEPENDENT_AMBULATORY_CARE_PROVIDER_SITE_OTHER): Payer: Self-pay | Admitting: Family Medicine

## 2021-11-30 NOTE — Progress Notes (Signed)
?TeleHealth Visit:  ?Due to the COVID-19 pandemic, this visit was completed with telemedicine (audio/video) technology to reduce patient and provider exposure as well as to preserve personal protective equipment.  ? ?Lori Montgomery has verbally consented to this TeleHealth visit. The patient is located at home, the provider is located at home. The participants in this visit include the listed provider and patient. The visit was conducted today via MyChart video. ? ?OBESITY ?Lori Montgomery is here to discuss her progress with her obesity treatment plan along with follow-up of her obesity related diagnoses.  ? ?Today's visit was # 13 ?Starting weight: 290 lbs ?Starting date: 04/17/2021 ?Total weight loss: 52 lbs at last in office visit on 11/13/21. ?Weight at last in office visit: 238 lbs on 11/13/21 ?Today's reported weight: 240 lbs  ? ?Nutrition Plan: the Category 3 Plan at 0% of time. ?Hunger is well controlled. Cravings are poorly controlled.  ? ?Interim History: Lori Montgomery reports she has been really been struggling recently.  She has been unable to adhere well to the her eating plan.  Overall she has done an excellent job with the meal plan and has lost 52 pounds since September 2022. ?She has been wondering if the Darcel Bayley is helping her.  However her hunger is well controlled.  She notes cravings, especially at night. ?She reports she feels better when she eats well.  She also feels she needs to drink more water. ?She sometimes feels that the category 3 is too much food for her to lose weight even though she knows this is not rational. ? ?Assessment/Plan:  ?1. Prediabetes ?Lori Montgomery has a diagnosis of prediabetes based on her elevated HgA1c.  A1c down to 5.6 from 5.8. ?She denies polyphagia. ?Medication(s): Mounjaro 15 mg weekly.  Denies side effects. ?Lab Results  ?Component Value Date  ? HGBA1C 5.6 09/02/2021  ? ?Lab Results  ?Component Value Date  ? INSULIN 8.2 09/02/2021  ? INSULIN 18.6 04/17/2021  ? ? ?Plan: ?Refill Mounjaro 15 mg  weekly. ? ?2. Mood disorder with emotional eating ?Lori Montgomery is struggling with cravings recently.  Cravings are worse at night.  She is currently on Lexapro and BuSpar.  Mood is stable overall. ? ?Plan: ?New prescription- topiramate 25 mg every evening. ?Denies history of glaucoma or cholelithiasis. ? ? ?3. Obesity: Current BMI 36.8 ?Lori Montgomery is currently in the action stage of change. As such, her goal is to continue with weight loss efforts.  ?She has agreed to the Category 3 Plan and keeping a food journal and adhering to recommended goals of 250-350 calories and 20-25 gms protein at breakfast ? ?Exercise goals: not discussed. ? ?Behavioral modification strategies: increasing lean protein intake and decreasing simple carbohydrates. ? ?Lori Montgomery has agreed to follow-up with our clinic in 2 weeks.  ? ?No orders of the defined types were placed in this encounter. ? ? ?Medications Discontinued During This Encounter  ?Medication Reason  ? tirzepatide [facility]) 15 MG/0.5ML Pen Reorder  ?  ? ?Meds ordered this encounter  ?Medications  ? topiramate (TOPAMAX) 25 MG tablet  ?  Sig: Take 1 tablet (25 mg total) by mouth every evening.  ?  Dispense:  30 tablet  ?  Refill:  0  ?  Order Specific Question:   Supervising Provider  ?  Answer:   Dennard Nip D [[id number]]  ? tirzepatide [facility]) 15 MG/0.5ML Pen  ?  Sig: Inject 15 mg into the skin once a week.  ?  Dispense:  2 mL  ?  Refill:  0  ?  Order Specific Question:   Supervising Provider  ?  Answer:   Dennard Nip D [[id number]]  ?   ? ?Objective:  ? ?VITALS: Per patient if applicable, see vitals. ?GENERAL: Alert and in no acute distress. ?CARDIOPULMONARY: No increased WOB. Speaking in clear sentences.  ?PSYCH: Pleasant and cooperative. Speech normal rate and rhythm. Affect is appropriate. Insight and judgement are appropriate. Attention is focused, linear, and appropriate.  ?NEURO: Oriented as arrived to appointment on time with no prompting.  ? ?Lab Results  ?Component Value Date  ?  CREATININE 0.77 09/02/2021  ? BUN 13 09/02/2021  ? NA 142 09/02/2021  ? K 4.3 09/02/2021  ? CL 104 09/02/2021  ? CO2 24 09/02/2021  ? ?Lab Results  ?Component Value Date  ? ALT 13 09/02/2021  ? AST 19 09/02/2021  ? ALKPHOS 82 09/02/2021  ? BILITOT 0.4 09/02/2021  ? ?Lab Results  ?Component Value Date  ? HGBA1C 5.6 09/02/2021  ? HGBA1C 5.8 (H) 04/17/2021  ? ?Lab Results  ?Component Value Date  ? INSULIN 8.2 09/02/2021  ? INSULIN 18.6 04/17/2021  ? ?Lab Results  ?Component Value Date  ? TSH 4.240 04/17/2021  ? ?Lab Results  ?Component Value Date  ? CHOL 176 09/02/2021  ? HDL 46 09/02/2021  ? LDLCALC 115 (H) 09/02/2021  ? TRIG 83 09/02/2021  ? CHOLHDL 4.0 11/01/2013  ? ?Lab Results  ?Component Value Date  ? WBC 6.1 04/17/2021  ? HGB 13.2 04/17/2021  ? HCT 41.7 04/17/2021  ? MCV 91 04/17/2021  ? PLT 245 04/17/2021  ? ?No results found for: IRON, TIBC, FERRITIN ?Lab Results  ?Component Value Date  ? VD25OH 39.5 09/02/2021  ? VD25OH 25.2 (L) 04/17/2021  ? ? ?Attestation Statements:  ? ?Reviewed by clinician on day of visit: allergies, medications, problem list, medical history, surgical history, family history, social history, and previous encounter notes. ? ?

## 2021-12-01 ENCOUNTER — Other Ambulatory Visit (HOSPITAL_COMMUNITY): Payer: Self-pay

## 2021-12-01 ENCOUNTER — Telehealth (INDEPENDENT_AMBULATORY_CARE_PROVIDER_SITE_OTHER): Payer: 59 | Admitting: Family Medicine

## 2021-12-01 ENCOUNTER — Encounter (INDEPENDENT_AMBULATORY_CARE_PROVIDER_SITE_OTHER): Payer: Self-pay | Admitting: Family Medicine

## 2021-12-01 DIAGNOSIS — E669 Obesity, unspecified: Secondary | ICD-10-CM

## 2021-12-01 DIAGNOSIS — R7303 Prediabetes: Secondary | ICD-10-CM

## 2021-12-01 DIAGNOSIS — F39 Unspecified mood [affective] disorder: Secondary | ICD-10-CM | POA: Diagnosis not present

## 2021-12-01 DIAGNOSIS — Z6836 Body mass index (BMI) 36.0-36.9, adult: Secondary | ICD-10-CM

## 2021-12-01 MED ORDER — TIRZEPATIDE 15 MG/0.5ML ~~LOC~~ SOAJ
15.0000 mg | SUBCUTANEOUS | 0 refills | Status: DC
  Filled 2021-12-01 – 2021-12-05 (×2): qty 2, 28d supply, fill #0

## 2021-12-01 MED ORDER — TOPIRAMATE 25 MG PO TABS
25.0000 mg | ORAL_TABLET | Freq: Every evening | ORAL | 0 refills | Status: DC
Start: 2021-12-01 — End: 2021-12-15

## 2021-12-01 NOTE — Telephone Encounter (Signed)
Please advise 

## 2021-12-01 NOTE — Telephone Encounter (Signed)
Dawn, would you like to try to get her in to discuss?

## 2021-12-05 ENCOUNTER — Other Ambulatory Visit (HOSPITAL_COMMUNITY): Payer: Self-pay

## 2021-12-07 ENCOUNTER — Encounter (INDEPENDENT_AMBULATORY_CARE_PROVIDER_SITE_OTHER): Payer: Self-pay | Admitting: Family Medicine

## 2021-12-08 NOTE — Telephone Encounter (Signed)
Please advise pt

## 2021-12-10 NOTE — Progress Notes (Signed)
?TeleHealth Visit:  ?This visit was completed with telemedicine (audio/video) technology. ?[name] has verbally consented to this TeleHealth visit. The patient is located at home, the provider is located at home. The participants in this visit include the listed provider and patient. The visit was conducted today via MyChart video. ? ?OBESITY ?Lori Montgomery is here to discuss her progress with her obesity treatment plan along with follow-up of her obesity related diagnoses.  ? ?Today's visit was # 14 ?Starting weight: 290 lbs ?Starting date: 04/17/2021 ?Total weight loss: 52 lbs at last in office visit on 11/13/21. ?Weight at last in office visit: 238 lbs on 11/13/21 ?Today's reported weight: 230 lbs  ? ?Nutrition Plan: Category 3 Plan and keeping a food journal and adhering to recommended goals of 250-350 calories and 20-25 gms protein at breakfast.  ?Hunger is well controlled. Cravings are well controlled.  ?Current exercise: none ? ?Interim History: Lori Montgomery is doing very well with her meal plan.  She is thrilled with how well the topiramate is helping with her food cravings.  She recently celebrated her daughter's graduation and her birthday but was able to mostly stick to plan.  She is down a total of 58 pounds since September 2022.  She is not exercising but plans on starting. ? ?Assessment/Plan:  ?1. Mood disorder with emotional eating ?Cravings are very well controlled with new addition of topiramate 25 mg.  She tried 50 mg one evening but felt like it was too high of a dose.  She does have dysgeusia but reports that it has stopped her from drinking diet soda and she is drinking much more water. ? ?Plan: ?Refill topiramate 25 mg every evening ? ?2. Prediabetes ?[name] has a diagnosis of prediabetes based on her elevated HgA1c. ?She denies polyphagia. ?Medication(s): Mounjaro 15 mg weekly ?Lab Results  ?Component Value Date  ? HGBA1C 5.6 09/02/2021  ? ?Lab Results  ?Component Value Date  ? INSULIN 8.2 09/02/2021  ? INSULIN  18.6 04/17/2021  ? ? ?Plan: ?Refill Mounjaro 15 mg weekly ? ?3. Obesity: Current BMI 36.8 ?[name] is currently in the action stage of change. As such, her goal is to continue with weight loss efforts.  ?She has agreed to Category 3 Plan and keeping a food journal and adhering to recommended goals of 250-350 calories and 20-25 gms protein at breakfast. .  ? ?Exercise goals: walk or recumbent 15 minutes 3 days per week ? ?Behavioral modification strategies: increasing lean protein intake. ? ?[name] has agreed to follow-up with our clinic in 2 weeks.  ? ?No orders of the defined types were placed in this encounter. ? ? ?Medications Discontinued During This Encounter  ?Medication Reason  ? tirzepatide [facility]) 15 MG/0.5ML Pen Reorder  ? topiramate (TOPAMAX) 25 MG tablet Reorder  ?  ? ?Meds ordered this encounter  ?Medications  ? tirzepatide (MOUNJARO) 15 MG/0.5ML Pen  ?  Sig: Inject 15 mg into the skin once a week.  ?  Dispense:  2 mL  ?  Refill:  0  ?  Order Specific Question:   Supervising Provider  ?  Answer:   Dennard Nip D [[id number]]  ? topiramate (TOPAMAX) 25 MG tablet  ?  Sig: Take 1 tablet (25 mg total) by mouth every evening.  ?  Dispense:  30 tablet  ?  Refill:  0  ?  Order Specific Question:   Supervising Provider  ?  Answer:   Dennard Nip D [[id number]]  ?   ? ?Objective:  ? ?VITALS:  Per patient if applicable, see vitals. ?GENERAL: Alert and in no acute distress. ?CARDIOPULMONARY: No increased WOB. Speaking in clear sentences.  ?PSYCH: Pleasant and cooperative. Speech normal rate and rhythm. Affect is appropriate. Insight and judgement are appropriate. Attention is focused, linear, and appropriate.  ?NEURO: Oriented as arrived to appointment on time with no prompting.  ? ?Lab Results  ?Component Value Date  ? CREATININE 0.77 09/02/2021  ? BUN 13 09/02/2021  ? NA 142 09/02/2021  ? K 4.3 09/02/2021  ? CL 104 09/02/2021  ? CO2 24 09/02/2021  ? ?Lab Results  ?Component Value Date  ? ALT 13 09/02/2021  ? AST 19  09/02/2021  ? ALKPHOS 82 09/02/2021  ? BILITOT 0.4 09/02/2021  ? ?Lab Results  ?Component Value Date  ? HGBA1C 5.6 09/02/2021  ? HGBA1C 5.8 (H) 04/17/2021  ? ?Lab Results  ?Component Value Date  ? INSULIN 8.2 09/02/2021  ? INSULIN 18.6 04/17/2021  ? ?Lab Results  ?Component Value Date  ? TSH 4.240 04/17/2021  ? ?Lab Results  ?Component Value Date  ? CHOL 176 09/02/2021  ? HDL 46 09/02/2021  ? LDLCALC 115 (H) 09/02/2021  ? TRIG 83 09/02/2021  ? CHOLHDL 4.0 11/01/2013  ? ?Lab Results  ?Component Value Date  ? WBC 6.1 04/17/2021  ? HGB 13.2 04/17/2021  ? HCT 41.7 04/17/2021  ? MCV 91 04/17/2021  ? PLT 245 04/17/2021  ? ?No results found for: IRON, TIBC, FERRITIN ?Lab Results  ?Component Value Date  ? VD25OH 39.5 09/02/2021  ? VD25OH 25.2 (L) 04/17/2021  ? ? ?Attestation Statements:  ? ?Reviewed by clinician on day of visit: allergies, medications, problem list, medical history, surgical history, family history, social history, and previous encounter notes. ? ? ? ?

## 2021-12-15 ENCOUNTER — Other Ambulatory Visit (HOSPITAL_COMMUNITY): Payer: Self-pay

## 2021-12-15 ENCOUNTER — Telehealth (INDEPENDENT_AMBULATORY_CARE_PROVIDER_SITE_OTHER): Payer: 59 | Admitting: Family Medicine

## 2021-12-15 ENCOUNTER — Encounter (INDEPENDENT_AMBULATORY_CARE_PROVIDER_SITE_OTHER): Payer: Self-pay | Admitting: Family Medicine

## 2021-12-15 DIAGNOSIS — R7303 Prediabetes: Secondary | ICD-10-CM

## 2021-12-15 DIAGNOSIS — Z6836 Body mass index (BMI) 36.0-36.9, adult: Secondary | ICD-10-CM | POA: Diagnosis not present

## 2021-12-15 DIAGNOSIS — E669 Obesity, unspecified: Secondary | ICD-10-CM | POA: Diagnosis not present

## 2021-12-15 DIAGNOSIS — F39 Unspecified mood [affective] disorder: Secondary | ICD-10-CM | POA: Diagnosis not present

## 2021-12-15 MED ORDER — TOPIRAMATE 25 MG PO TABS
25.0000 mg | ORAL_TABLET | Freq: Every evening | ORAL | 0 refills | Status: DC
  Filled 2021-12-15 – 2021-12-29 (×2): qty 30, 30d supply, fill #0

## 2021-12-15 MED ORDER — TIRZEPATIDE 15 MG/0.5ML ~~LOC~~ SOAJ
15.0000 mg | SUBCUTANEOUS | 0 refills | Status: DC
  Filled 2021-12-15 – 2021-12-29 (×2): qty 2, 28d supply, fill #0

## 2021-12-22 ENCOUNTER — Ambulatory Visit (INDEPENDENT_AMBULATORY_CARE_PROVIDER_SITE_OTHER): Payer: 59 | Admitting: Nurse Practitioner

## 2021-12-29 ENCOUNTER — Ambulatory Visit (INDEPENDENT_AMBULATORY_CARE_PROVIDER_SITE_OTHER): Payer: 59 | Admitting: Nurse Practitioner

## 2021-12-29 ENCOUNTER — Other Ambulatory Visit (INDEPENDENT_AMBULATORY_CARE_PROVIDER_SITE_OTHER): Payer: Self-pay | Admitting: Family Medicine

## 2021-12-29 ENCOUNTER — Other Ambulatory Visit (HOSPITAL_COMMUNITY): Payer: Self-pay

## 2021-12-29 ENCOUNTER — Encounter (INDEPENDENT_AMBULATORY_CARE_PROVIDER_SITE_OTHER): Payer: Self-pay | Admitting: Nurse Practitioner

## 2021-12-29 VITALS — BP 105/73 | HR 78 | Temp 97.9°F | Ht 68.0 in | Wt 227.0 lb

## 2021-12-29 DIAGNOSIS — E669 Obesity, unspecified: Secondary | ICD-10-CM | POA: Diagnosis not present

## 2021-12-29 DIAGNOSIS — F39 Unspecified mood [affective] disorder: Secondary | ICD-10-CM | POA: Diagnosis not present

## 2021-12-29 DIAGNOSIS — E559 Vitamin D deficiency, unspecified: Secondary | ICD-10-CM

## 2021-12-29 DIAGNOSIS — Z6834 Body mass index (BMI) 34.0-34.9, adult: Secondary | ICD-10-CM

## 2021-12-29 DIAGNOSIS — R7303 Prediabetes: Secondary | ICD-10-CM

## 2021-12-29 MED ORDER — TIRZEPATIDE 15 MG/0.5ML ~~LOC~~ SOAJ
15.0000 mg | SUBCUTANEOUS | 0 refills | Status: DC
  Filled 2021-12-29 – 2021-12-30 (×3): qty 2, 28d supply, fill #0

## 2021-12-29 MED ORDER — VITAMIN D (ERGOCALCIFEROL) 1.25 MG (50000 UNIT) PO CAPS
50000.0000 [IU] | ORAL_CAPSULE | ORAL | 0 refills | Status: DC
  Filled 2021-12-29: qty 4, 28d supply, fill #0

## 2021-12-29 MED ORDER — TOPIRAMATE 25 MG PO TABS
25.0000 mg | ORAL_TABLET | Freq: Two times a day (BID) | ORAL | 0 refills | Status: DC
Start: 2021-12-29 — End: 2022-01-29
  Filled 2021-12-29 (×2): qty 60, 30d supply, fill #0

## 2021-12-30 ENCOUNTER — Other Ambulatory Visit (HOSPITAL_COMMUNITY): Payer: Self-pay

## 2021-12-30 NOTE — Progress Notes (Unsigned)
Chief Complaint:   OBESITY Lori Montgomery is here to discuss her progress with her obesity treatment plan along with follow-up of her obesity related diagnoses. Lori Montgomery is on the Category 3 Plan and keeping a food journal and adhering to recommended goals of 250-350 calories and 20-25 grams of  protein with breakfast options and states she is following her eating plan approximately 0% of the time. Lori Montgomery states she is walking for 15-20 minutes 2 times per week.  Today's visit was #: 15 Starting weight: 290 lbs Starting date: 04/17/2021 Today's weight: 227 lbs Today's date: 12/29/2021 Total lbs lost to date: 63 lbs Total lbs lost since last in-office visit: 11 lbs  Interim History: Lori Montgomery overall has done well with weight loss. She is following on PC/[facility]. She doesn't feel she is meeting her protein goals. She is drinking water and stopped soda intake after starting Topamax. She started walking since her last visit. She is traveling a lot this summer.   Subjective:   1. Vitamin D deficiency Lori Montgomery is taking Vitamin D 50,000 IU weekly. She denies nausea, vomiting, and muscle weakness.   2. Pre-diabetes Lori Montgomery is taking Mounjaro 15 mg. She denies side effects. She is doing well. She denies hunger or cravings.   3. Mood disorder (Lori Montgomery) with emotional eating Lori Montgomery stopped taking Topamax 50 mg 2 days ago due to numbness in her hands and eyelid twitching. She felt it was beneficial and would like to restart taking it again.   Assessment/Plan:   1. Vitamin D deficiency Low Vitamin D level contributes to fatigue and are associated with obesity, breast, and colon cancer. We will refill prescription Vitamin D 50,000 IU every week for  1 month with 1 refill and Lori Montgomery will follow-up for routine testing of Vitamin D, at least 2-3 times per year to avoid over-replacement. We discussed side effects.   - Vitamin D, Ergocalciferol, (DRISDOL) 1.25 MG (50000 UNIT) CAPS capsule; Take 1 capsule (50,000 Units total) by mouth  every 7 (seven) days.  Dispense: 4 capsule; Refill: 0  2. Pre-diabetes Lori Montgomery will continue to work on weight loss, exercise, and decreasing simple carbohydrates to help decrease the risk of diabetes. We will refill Mounjaro 15 mg for 1 month with no refills. We discussed side effects.   - tirzepatide (MOUNJARO) 15 MG/0.5ML Pen; Inject 15 mg into the skin once a week.  Dispense: 2 mL; Refill: 0  3. Mood disorder ([facility]) with emotional eating We will refill Topamax 25 mg for 1 month with no refills. She will change to 2 times daily. We discuss side effects. She will take 25 mg daily for 1 week then increase to 2 times daily. Behavior modification techniques were discussed today to help Lori Montgomery deal with her emotional/non-hunger eating behaviors.  Orders and follow up as documented in patient record.   - topiramate (TOPAMAX) 25 MG tablet; Take 1 tablet (25 mg total) by mouth 2 (two) times daily.  Dispense: 60 tablet; Refill: 0  4. Obesity with current BMI of 34.5 Lori Montgomery is currently in the action stage of change. As such, her goal is to continue with weight loss efforts. She has agreed to keeping a food journal and adhering to recommended goals of 1500 calories and 90 plus grams of protein.   Handouts: [employer] was provided today. We will obtain labs at next in office visit.   Exercise goals:  As is.   Behavioral modification strategies: increasing lean protein intake, increasing vegetables, increasing water intake,  no skipping meals, and planning for success.  Lori Montgomery has agreed to follow-up with our clinic in 3 weeks. She was informed of the importance of frequent follow-up visits to maximize her success with intensive lifestyle modifications for her multiple health conditions.   Objective:   Blood pressure 105/73, pulse 78, temperature 97.9 F (36.6 C), height '5\' 8"'$  (1.727 m), weight 227 lb (103 kg), SpO2 98 %. Body mass index is 34.52 kg/m.  General: Cooperative, alert, well developed, in  no acute distress. HEENT: Conjunctivae and lids unremarkable. Cardiovascular: Regular rhythm.  Lungs: Normal work of breathing. Neurologic: No focal deficits.   Lab Results  Component Value Date   CREATININE 0.77 09/02/2021   BUN 13 09/02/2021   NA 142 09/02/2021   K 4.3 09/02/2021   CL 104 09/02/2021   CO2 24 09/02/2021   Lab Results  Component Value Date   ALT 13 09/02/2021   AST 19 09/02/2021   ALKPHOS 82 09/02/2021   BILITOT 0.4 09/02/2021   Lab Results  Component Value Date   HGBA1C 5.6 09/02/2021   HGBA1C 5.8 (H) 04/17/2021   Lab Results  Component Value Date   INSULIN 8.2 09/02/2021   INSULIN 18.6 04/17/2021   Lab Results  Component Value Date   TSH 4.240 04/17/2021   Lab Results  Component Value Date   CHOL 176 09/02/2021   HDL 46 09/02/2021   LDLCALC 115 (H) 09/02/2021   TRIG 83 09/02/2021   CHOLHDL 4.0 11/01/2013   Lab Results  Component Value Date   VD25OH 39.5 09/02/2021   VD25OH 25.2 (L) 04/17/2021   Lab Results  Component Value Date   WBC 6.1 04/17/2021   HGB 13.2 04/17/2021   HCT 41.7 04/17/2021   MCV 91 04/17/2021   PLT 245 04/17/2021   No results found for: IRON, TIBC, FERRITIN  Attestation Statements:   Reviewed by clinician on day of visit: allergies, medications, problem list, medical history, surgical history, family history, social history, and previous encounter notes.  I, Lizbeth Bark, RMA, am acting as Location manager for Everardo Pacific, FNP.   I have reviewed the above documentation for accuracy and completeness, and I agree with the above. Everardo Pacific, FNP

## 2021-12-31 ENCOUNTER — Other Ambulatory Visit (HOSPITAL_COMMUNITY): Payer: Self-pay

## 2021-12-31 MED ORDER — TIRZEPATIDE 15 MG/0.5ML ~~LOC~~ SOAJ: 15.0000 mg | SUBCUTANEOUS | 0 refills | Status: DC

## 2022-01-01 ENCOUNTER — Telehealth (INDEPENDENT_AMBULATORY_CARE_PROVIDER_SITE_OTHER): Payer: Self-pay | Admitting: Nurse Practitioner

## 2022-01-01 ENCOUNTER — Encounter (INDEPENDENT_AMBULATORY_CARE_PROVIDER_SITE_OTHER): Payer: Self-pay

## 2022-01-01 NOTE — Telephone Encounter (Signed)
Lori Montgomery - Prior authorization denied for [employer]. Patient already uses copay card. Patient sent denial message via mychart.

## 2022-01-08 NOTE — Progress Notes (Deleted)
TeleHealth Visit:  This visit was completed with telemedicine (audio/video) technology. Lori Montgomery has verbally consented to this TeleHealth visit. The patient is located at home, the provider is located at home. The participants in this visit include the listed provider and patient. The visit was conducted today via MyChart video.  Lori Montgomery is here to discuss her progress with her Lori treatment plan along with follow-up of her Lori related diagnoses.   Today's visit was # 16 Starting weight: 290 lbs Starting date: 04/17/2021 Weight at last in office visit: 227 lbs on 12/29/21 Total weight loss: 63 lbs at last in office visit on 12/29/21. Today's reported weight: *** lbs No weight reported.  Nutrition Plan: keeping a food journal and adhering to recommended goals of 1500 calories and 90 protein.  Hunger is {EWCONTROLASSESSMENT:24261}. Cravings are {EWCONTROLASSESSMENT:24261}.  Current exercise: {exercise types:16438}  Interim History: ***  Assessment/Plan:  1. ***  2. ***  3. ***  Lori: Current BMI *** Lori Montgomery {CHL AMB IS/IS NOT:210130109} currently in the action stage of change. As such, her goal is to {MWMwtloss#1:210800005}.  She has agreed to {MWMwtlossportion/plan2:23431}.   Exercise goals: {MWM EXERCISE RECS:23473}  Behavioral modification strategies: {MWMwtlossdietstrategies3:23432}.  Lori Montgomery has agreed to follow-up with our clinic in {NUMBER 1-10:22536} weeks.   No orders of the defined types were placed in this encounter.   There are no discontinued medications.   No orders of the defined types were placed in this encounter.     Objective:   VITALS: Per patient if applicable, see vitals. GENERAL: Alert and in no acute distress. CARDIOPULMONARY: No increased WOB. Speaking in clear sentences.  PSYCH: Pleasant and cooperative. Speech normal rate and rhythm. Affect is appropriate. Insight and judgement are appropriate. Attention is focused, linear, and  appropriate.  NEURO: Oriented as arrived to appointment on time with no prompting.   Lab Results  Component Value Date   CREATININE 0.77 09/02/2021   BUN 13 09/02/2021   NA 142 09/02/2021   K 4.3 09/02/2021   CL 104 09/02/2021   CO2 24 09/02/2021   Lab Results  Component Value Date   ALT 13 09/02/2021   AST 19 09/02/2021   ALKPHOS 82 09/02/2021   BILITOT 0.4 09/02/2021   Lab Results  Component Value Date   HGBA1C 5.6 09/02/2021   HGBA1C 5.8 (H) 04/17/2021   Lab Results  Component Value Date   INSULIN 8.2 09/02/2021   INSULIN 18.6 04/17/2021   Lab Results  Component Value Date   TSH 4.240 04/17/2021   Lab Results  Component Value Date   CHOL 176 09/02/2021   HDL 46 09/02/2021   LDLCALC 115 (H) 09/02/2021   TRIG 83 09/02/2021   CHOLHDL 4.0 11/01/2013   Lab Results  Component Value Date   WBC 6.1 04/17/2021   HGB 13.2 04/17/2021   HCT 41.7 04/17/2021   MCV 91 04/17/2021   PLT 245 04/17/2021   No results found for: IRON, TIBC, FERRITIN Lab Results  Component Value Date   VD25OH 39.5 09/02/2021   VD25OH 25.2 (L) 04/17/2021    Attestation Statements:   Reviewed by clinician on day of visit: allergies, medications, problem list, medical history, surgical history, family history, social history, and previous encounter notes.  ***(delete if time-based billing not used)Time spent on visit including pre-visit chart review and post-visit charting and care was *** minutes.  This time may include: -preparing to see the patient (e.g., review of tests) -obtaining and/or reviewing separately obtained history -performing a medically appropriate  examination and/or evaluation -counseling and educating the patient/family/caregiver -ordering medications, tests, or procedures -referring and communicating with other health care professionals (when not separately reported) -documenting clinical information in the electronic or other health record -independently  interpreting results (not separately reported) and communicating results to the patient/ family/caregiver -care coordination (not separately reported)

## 2022-01-12 ENCOUNTER — Telehealth (INDEPENDENT_AMBULATORY_CARE_PROVIDER_SITE_OTHER): Payer: 59 | Admitting: Family Medicine

## 2022-01-14 NOTE — Progress Notes (Signed)
TeleHealth Visit:  This visit was completed with telemedicine (audio/video) technology. Lori Montgomery has verbally consented to this TeleHealth visit. The patient is located at home, the provider is located at home. The participants in this visit include the listed provider and patient. The visit was conducted today via MyChart video.  OBESITY Lori Montgomery is here to discuss her progress with her obesity treatment plan along with follow-up of her obesity related diagnoses.   Today's visit was # 16 Starting weight: 290 lbs Starting date: 04/17/2021 Weight at last in office visit: 227 lbs on 12/29/21 Total weight loss: 63 lbs at last in office visit on 12/29/21. Today's reported weight: 224 lbs   Nutrition Plan: Cat 3 plan 50- 75%.  Hunger is well controlled. Cravings are well controlled.  Current exercise:  walking dogs for 15 minutes.  Interim History: Lori Montgomery reports she has not been journaling but has been following the category 3 plan when she can.  She travels for work which makes journaling harder. She also recently moved her mother into assisted living which has been stressful.  She has done very well and has lost 63 pounds since starting our program last September. She reports she is not drinking enough water and wants to work on this.  Recently she had issues with an eye twitch and paresthesias but says this has stopped since she started taking vitamin C.  She is concerned about what to do once she is unable to get [facility].  Assessment/Plan:  1. Prediabetes Anael has a diagnosis of prediabetes based on her elevated HgA1c. She denies polyphagia. Medication(s): Mounjaro 15 mg weekly.  Tolerating well. She is concerned about coverage for [facility] ending.  She says that her insurance wants the patient to have tried metformin. Lab Results  Component Value Date   HGBA1C 5.6 09/02/2021   Lab Results  Component Value Date   INSULIN 8.2 09/02/2021   INSULIN 18.6 04/17/2021    Plan: If  unable to get [facility] covered, we will try to get Ozempic covered. Refill Mounjaro. tirzepatide [facility]) 15 MG/0.5ML Pen   Sig: Inject 15 mg into the skin once a week.   Dispense:  2 mL   Refill:  0   We will start a low dose of metformin: metFORMIN (GLUCOPHAGE) 500 MG tablet   Sig: Take 1 tablet (500 mg total) by mouth daily with breakfast.   Dispense:  30 tablet   Refill:  0   2.  Mood disorder with emotional eating Lashuna reports she had a recent increase in anxiety/depression.  Her PCP increased her BuSpar to 10 mg twice daily and she reports her mood is stable. She is currently on BuSpar 10 mg twice daily and Lexapro 20 mg daily.  Also has Rx for Valium 10 mg by mouth as needed for anxiety. She is on topiramate 25 mg twice daily and reports cravings are very well controlled.  Plan: Continue all medications at current dosages.  3. Obesity: Current BMI 34.5 Lori Montgomery is currently in the action stage of change. As such, her goal is to continue with weight loss efforts.  She has agreed to the Category 3 Plan.   Exercise goals: as is.  Behavioral modification strategies: increasing lean protein intake, decreasing simple carbohydrates, and planning for success.  Lori Montgomery has agreed to follow-up with our clinic in 2 weeks, fasting.  No orders of the defined types were placed in this encounter.   Medications Discontinued During This Encounter  Medication Reason   tirzepatide [facility]) 15 MG/0.5ML  Pen Reorder   busPIRone (BUSPAR) 5 MG tablet Dose change   metFORMIN (GLUCOPHAGE) 500 MG tablet Reorder     Meds ordered this encounter  Medications   tirzepatide (MOUNJARO) 15 MG/0.5ML Pen    Sig: Inject 15 mg into the skin once a week.    Dispense:  2 mL    Refill:  0    Order Specific Question:   Supervising Provider    Answer:   Starlyn Skeans [[id number]]   DISCONTD: metFORMIN (GLUCOPHAGE) 500 MG tablet    Sig: Take 1 tablet (500 mg total) by mouth daily with breakfast.     Dispense:  30 tablet    Refill:  0    Order Specific Question:   Supervising Provider    Answer:   Dennard Nip D [[id number]]   metFORMIN (GLUCOPHAGE) 500 MG tablet    Sig: Take 1 tablet (500 mg total) by mouth daily with breakfast.    Dispense:  30 tablet    Refill:  0    Order Specific Question:   Supervising Provider    Answer:   Dennard Nip D [[id number]]      Objective:   VITALS: Per patient if applicable, see vitals. GENERAL: Alert and in no acute distress. CARDIOPULMONARY: No increased WOB. Speaking in clear sentences.  PSYCH: Pleasant and cooperative. Speech normal rate and rhythm. Affect is appropriate. Insight and judgement are appropriate. Attention is focused, linear, and appropriate.  NEURO: Oriented as arrived to appointment on time with no prompting.   Lab Results  Component Value Date   CREATININE 0.77 09/02/2021   BUN 13 09/02/2021   NA 142 09/02/2021   K 4.3 09/02/2021   CL 104 09/02/2021   CO2 24 09/02/2021   Lab Results  Component Value Date   ALT 13 09/02/2021   AST 19 09/02/2021   ALKPHOS 82 09/02/2021   BILITOT 0.4 09/02/2021   Lab Results  Component Value Date   HGBA1C 5.6 09/02/2021   HGBA1C 5.8 (H) 04/17/2021   Lab Results  Component Value Date   INSULIN 8.2 09/02/2021   INSULIN 18.6 04/17/2021   Lab Results  Component Value Date   TSH 4.240 04/17/2021   Lab Results  Component Value Date   CHOL 176 09/02/2021   HDL 46 09/02/2021   LDLCALC 115 (H) 09/02/2021   TRIG 83 09/02/2021   CHOLHDL 4.0 11/01/2013   Lab Results  Component Value Date   WBC 6.1 04/17/2021   HGB 13.2 04/17/2021   HCT 41.7 04/17/2021   MCV 91 04/17/2021   PLT 245 04/17/2021   No results found for: "IRON", "TIBC", "FERRITIN" Lab Results  Component Value Date   VD25OH 39.5 09/02/2021   VD25OH 25.2 (L) 04/17/2021    Attestation Statements:   Reviewed by clinician on day of visit: allergies, medications, problem list, medical history, surgical history,  family history, social history, and previous encounter notes.

## 2022-01-15 ENCOUNTER — Encounter (INDEPENDENT_AMBULATORY_CARE_PROVIDER_SITE_OTHER): Payer: Self-pay | Admitting: Family Medicine

## 2022-01-15 ENCOUNTER — Telehealth (INDEPENDENT_AMBULATORY_CARE_PROVIDER_SITE_OTHER): Payer: 59 | Admitting: Family Medicine

## 2022-01-15 ENCOUNTER — Other Ambulatory Visit (HOSPITAL_COMMUNITY): Payer: Self-pay

## 2022-01-15 DIAGNOSIS — R7303 Prediabetes: Secondary | ICD-10-CM

## 2022-01-15 DIAGNOSIS — F39 Unspecified mood [affective] disorder: Secondary | ICD-10-CM | POA: Diagnosis not present

## 2022-01-15 DIAGNOSIS — Z6834 Body mass index (BMI) 34.0-34.9, adult: Secondary | ICD-10-CM

## 2022-01-15 DIAGNOSIS — Z7984 Long term (current) use of oral hypoglycemic drugs: Secondary | ICD-10-CM

## 2022-01-15 DIAGNOSIS — E669 Obesity, unspecified: Secondary | ICD-10-CM

## 2022-01-15 MED ORDER — METFORMIN HCL 500 MG PO TABS: 500.0000 mg | ORAL_TABLET | Freq: Every day | ORAL | 0 refills | Status: DC

## 2022-01-15 MED ORDER — TIRZEPATIDE 15 MG/0.5ML ~~LOC~~ SOAJ
15.0000 mg | SUBCUTANEOUS | 0 refills | Status: DC
Start: 2022-01-15 — End: 2022-02-16
  Filled 2022-01-15: qty 2, 28d supply, fill #0

## 2022-01-15 MED ORDER — METFORMIN HCL 500 MG PO TABS
500.0000 mg | ORAL_TABLET | Freq: Every day | ORAL | 0 refills | Status: DC
  Filled 2022-01-15: qty 30, 30d supply, fill #0

## 2022-01-20 ENCOUNTER — Other Ambulatory Visit: Payer: Self-pay | Admitting: Obstetrics and Gynecology

## 2022-01-20 ENCOUNTER — Other Ambulatory Visit (HOSPITAL_COMMUNITY): Payer: Self-pay

## 2022-01-20 DIAGNOSIS — N6320 Unspecified lump in the left breast, unspecified quadrant: Secondary | ICD-10-CM

## 2022-01-20 NOTE — Telephone Encounter (Signed)
FYI

## 2022-01-26 ENCOUNTER — Other Ambulatory Visit (HOSPITAL_COMMUNITY): Payer: Self-pay

## 2022-01-26 ENCOUNTER — Encounter (INDEPENDENT_AMBULATORY_CARE_PROVIDER_SITE_OTHER): Payer: Self-pay | Admitting: Family Medicine

## 2022-01-26 ENCOUNTER — Ambulatory Visit (INDEPENDENT_AMBULATORY_CARE_PROVIDER_SITE_OTHER): Payer: 59 | Admitting: Nurse Practitioner

## 2022-01-26 ENCOUNTER — Encounter (INDEPENDENT_AMBULATORY_CARE_PROVIDER_SITE_OTHER): Payer: Self-pay | Admitting: Nurse Practitioner

## 2022-01-26 NOTE — Telephone Encounter (Signed)
Appointment change request.

## 2022-01-27 ENCOUNTER — Telehealth (INDEPENDENT_AMBULATORY_CARE_PROVIDER_SITE_OTHER): Payer: Self-pay | Admitting: Family Medicine

## 2022-01-27 ENCOUNTER — Ambulatory Visit
Admission: RE | Admit: 2022-01-27 | Discharge: 2022-01-27 | Disposition: A | Discharge status: Home / Self Care | Payer: 59 | Source: Ambulatory Visit | Attending: Obstetrics and Gynecology | Admitting: Obstetrics and Gynecology

## 2022-01-27 ENCOUNTER — Encounter (INDEPENDENT_AMBULATORY_CARE_PROVIDER_SITE_OTHER): Payer: Self-pay

## 2022-01-27 ENCOUNTER — Ambulatory Visit: Payer: 59

## 2022-01-27 DIAGNOSIS — N6320 Unspecified lump in the left breast, unspecified quadrant: Secondary | ICD-10-CM

## 2022-01-27 NOTE — Telephone Encounter (Signed)
Lori Montgomery - Prior authorization denied for Mounjaro. Patient already uses copay card. Patient sent denial message via mychart.  

## 2022-01-29 ENCOUNTER — Encounter (INDEPENDENT_AMBULATORY_CARE_PROVIDER_SITE_OTHER): Payer: Self-pay | Admitting: Nurse Practitioner

## 2022-01-29 ENCOUNTER — Ambulatory Visit (INDEPENDENT_AMBULATORY_CARE_PROVIDER_SITE_OTHER): Payer: 59 | Admitting: Nurse Practitioner

## 2022-01-29 VITALS — BP 101/70 | HR 68 | Temp 97.8°F | Ht 68.0 in | Wt 222.0 lb

## 2022-01-29 DIAGNOSIS — Z7985 Long-term (current) use of injectable non-insulin antidiabetic drugs: Secondary | ICD-10-CM

## 2022-01-29 DIAGNOSIS — E7849 Other hyperlipidemia: Secondary | ICD-10-CM | POA: Diagnosis not present

## 2022-01-29 DIAGNOSIS — R7303 Prediabetes: Secondary | ICD-10-CM | POA: Diagnosis not present

## 2022-01-29 DIAGNOSIS — I10 Essential (primary) hypertension: Secondary | ICD-10-CM

## 2022-01-29 DIAGNOSIS — Z7984 Long term (current) use of oral hypoglycemic drugs: Secondary | ICD-10-CM

## 2022-01-29 DIAGNOSIS — E559 Vitamin D deficiency, unspecified: Secondary | ICD-10-CM | POA: Diagnosis not present

## 2022-01-29 DIAGNOSIS — E669 Obesity, unspecified: Secondary | ICD-10-CM

## 2022-01-29 DIAGNOSIS — Z6833 Body mass index (BMI) 33.0-33.9, adult: Secondary | ICD-10-CM

## 2022-01-30 ENCOUNTER — Other Ambulatory Visit: Payer: 59

## 2022-01-30 LAB — LIPID PANEL WITH LDL/HDL RATIO
Cholesterol, Total: 178 mg/dL (ref 100–199)
HDL: 55 mg/dL (ref >39)
LDL Chol Calc (NIH): 111 mg/dL — ABNORMAL HIGH (ref 0–99)
LDL/HDL Ratio: 2 ratio (ref 0.0–3.2)
Triglycerides: 65 mg/dL (ref 0–149)
VLDL Cholesterol Cal: 12 mg/dL (ref 5–40)

## 2022-01-30 LAB — COMPREHENSIVE METABOLIC PANEL
ALT: 16 IU/L (ref 0–32)
AST: 16 IU/L (ref 0–40)
Albumin/Globulin Ratio: 1.9 (ref 1.2–2.2)
Albumin: 4.3 g/dL (ref 3.8–4.9)
Alkaline Phosphatase: 60 IU/L (ref 44–121)
BUN/Creatinine Ratio: 15 (ref 9–23)
BUN: 13 mg/dL (ref 6–24)
Bilirubin Total: 0.6 mg/dL (ref 0.0–1.2)
CO2: 24 mmol/L (ref 20–29)
Calcium: 9.5 mg/dL (ref 8.7–10.2)
Chloride: 102 mmol/L (ref 96–106)
Creatinine, Ser: 0.85 mg/dL (ref 0.57–1.00)
Globulin, Total: 2.3 g/dL (ref 1.5–4.5)
Glucose: 82 mg/dL (ref 70–99)
Potassium: 3.8 mmol/L (ref 3.5–5.2)
Sodium: 142 mmol/L (ref 134–144)
Total Protein: 6.6 g/dL (ref 6.0–8.5)
eGFR: 82 mL/min/{1.73_m2} (ref >59)

## 2022-01-30 LAB — VITAMIN D 25 HYDROXY (VIT D DEFICIENCY, FRACTURES): Vit D, 25-Hydroxy: 26 ng/mL — ABNORMAL LOW (ref 30.0–100.0)

## 2022-01-30 LAB — HEMOGLOBIN A1C
Est. average glucose Bld gHb Est-mCnc: 111 mg/dL
Hgb A1c MFr Bld: 5.5 % (ref 4.8–5.6)

## 2022-01-30 LAB — INSULIN, RANDOM: INSULIN: 13.7 u[IU]/mL (ref 2.6–24.9)

## 2022-02-10 NOTE — Progress Notes (Deleted)
TeleHealth Visit:  This visit was completed with telemedicine (audio/video) technology. Kiandra has verbally consented to this TeleHealth visit. The patient is located at home, the provider is located at home. The participants in this visit include the listed provider and patient. The visit was conducted today via MyChart video.  OBESITY Nyeshia is here to discuss her progress with her obesity treatment plan along with follow-up of her obesity related diagnoses.   Today's visit was # 17 Starting weight: 290 lbs Starting date: 04/17/2021 Weight at last in office visit: 222 lbs on 01/29/22 Total weight loss: 68 lbs at last in office visit on 01/29/22. Today's reported weight: *** lbs No weight reported. Weight change since last visit: ***   Nutrition Plan: {MWMwtlossportion/plan2:23431}.  Hunger is {EWCONTROLASSESSMENT:24261}. Cravings are {EWCONTROLASSESSMENT:24261}.  Current exercise: {exercise types:16438}  Interim History: ***  Assessment/Plan:  1. ***  2. ***  3. ***  Obesity: Current BMI *** Deneka {CHL AMB IS/IS NOT:210130109} currently in the action stage of change. As such, her goal is to {MWMwtloss#1:210800005}.  She has agreed to {MWMwtlossportion/plan2:23431}.   Exercise goals: {MWM EXERCISE RECS:23473}  Behavioral modification strategies: {MWMwtlossdietstrategies3:23432}.  Lucilla has agreed to follow-up with our clinic in {NUMBER 1-10:22536} weeks.   No orders of the defined types were placed in this encounter.   There are no discontinued medications.   No orders of the defined types were placed in this encounter.     Objective:   VITALS: Per patient if applicable, see vitals. GENERAL: Alert and in no acute distress. CARDIOPULMONARY: No increased WOB. Speaking in clear sentences.  PSYCH: Pleasant and cooperative. Speech normal rate and rhythm. Affect is appropriate. Insight and judgement are appropriate. Attention is focused, linear, and appropriate.   NEURO: Oriented as arrived to appointment on time with no prompting.   Lab Results  Component Value Date   CREATININE 0.85 01/29/2022   BUN 13 01/29/2022   NA 142 01/29/2022   K 3.8 01/29/2022   CL 102 01/29/2022   CO2 24 01/29/2022   Lab Results  Component Value Date   ALT 16 01/29/2022   AST 16 01/29/2022   ALKPHOS 60 01/29/2022   BILITOT 0.6 01/29/2022   Lab Results  Component Value Date   HGBA1C 5.5 01/29/2022   HGBA1C 5.6 09/02/2021   HGBA1C 5.8 (H) 04/17/2021   Lab Results  Component Value Date   INSULIN 13.7 01/29/2022   INSULIN 8.2 09/02/2021   INSULIN 18.6 04/17/2021   Lab Results  Component Value Date   TSH 4.240 04/17/2021   Lab Results  Component Value Date   CHOL 178 01/29/2022   HDL 55 01/29/2022   LDLCALC 111 (H) 01/29/2022   TRIG 65 01/29/2022   CHOLHDL 4.0 11/01/2013   Lab Results  Component Value Date   WBC 6.1 04/17/2021   HGB 13.2 04/17/2021   HCT 41.7 04/17/2021   MCV 91 04/17/2021   PLT 245 04/17/2021   No results found for: "IRON", "TIBC", "FERRITIN" Lab Results  Component Value Date   VD25OH 26.0 (L) 01/29/2022   VD25OH 39.5 09/02/2021   VD25OH 25.2 (L) 04/17/2021    Attestation Statements:   Reviewed by clinician on day of visit: allergies, medications, problem list, medical history, surgical history, family history, social history, and previous encounter notes.  ***(delete if time-based billing not used) Time spent on visit including the items listed below was *** minutes.  -preparing to see the patient (e.g., review of tests, history, previous notes) -obtaining and/or reviewing separately obtained  history -counseling and educating the patient/family/caregiver -documenting clinical information in the electronic or other health record -ordering medications, tests, or procedures -independently interpreting results and communicating results to the patient/ family/caregiver -referring and communicating with other health  care professionals  -care coordination

## 2022-02-11 ENCOUNTER — Telehealth (INDEPENDENT_AMBULATORY_CARE_PROVIDER_SITE_OTHER): Payer: 59 | Admitting: Family Medicine

## 2022-02-16 ENCOUNTER — Encounter (INDEPENDENT_AMBULATORY_CARE_PROVIDER_SITE_OTHER): Payer: Self-pay | Admitting: Family Medicine

## 2022-02-16 ENCOUNTER — Telehealth (INDEPENDENT_AMBULATORY_CARE_PROVIDER_SITE_OTHER): Payer: 59 | Admitting: Family Medicine

## 2022-02-16 ENCOUNTER — Other Ambulatory Visit (HOSPITAL_COMMUNITY): Payer: Self-pay

## 2022-02-16 DIAGNOSIS — F509 Eating disorder, unspecified: Secondary | ICD-10-CM | POA: Diagnosis not present

## 2022-02-16 DIAGNOSIS — Z6833 Body mass index (BMI) 33.0-33.9, adult: Secondary | ICD-10-CM

## 2022-02-16 DIAGNOSIS — R7303 Prediabetes: Secondary | ICD-10-CM | POA: Diagnosis not present

## 2022-02-16 DIAGNOSIS — E669 Obesity, unspecified: Secondary | ICD-10-CM | POA: Diagnosis not present

## 2022-02-16 MED ORDER — TIRZEPATIDE 15 MG/0.5ML ~~LOC~~ SOAJ
15.0000 mg | SUBCUTANEOUS | 0 refills | Status: DC
  Filled 2022-02-16: qty 2, 28d supply, fill #0

## 2022-02-16 MED ORDER — TOPIRAMATE 25 MG PO TABS: 25.0000 mg | ORAL_TABLET | Freq: Every day | ORAL | 0 refills | Status: DC

## 2022-02-16 MED ORDER — METFORMIN HCL 500 MG PO TABS
500.0000 mg | ORAL_TABLET | Freq: Every day | ORAL | 0 refills | Status: DC
  Filled 2022-02-16: qty 30, 30d supply, fill #0

## 2022-02-16 NOTE — Progress Notes (Signed)
TeleHealth Visit:  This visit was completed with telemedicine (audio/video) technology. Lori Montgomery has verbally consented to this TeleHealth visit. The patient is located at home, the provider is located at home. The participants in this visit include the listed provider and patient. The visit was conducted today via MyChart video.  OBESITY Lori Montgomery is here to discuss her progress with her obesity treatment plan along with follow-up of her obesity related diagnoses.   Today's visit was # 17 Starting weight: 290 lbs Starting date: 04/17/2021 Weight at last in office visit: 222 lbs on 01/29/22 Total weight loss: 68 lbs at last in office visit on 01/29/22. Today's reported weight: 224 lbs -scales are 2 lbs heavy  Nutrition Plan: the Category 3 Plan. Off plan until  2 days ago. Hunger is moderately controlled. Cravings are moderately controlled.  Current exercise: walking 3 days/week for 20 minutes  Interim History: Lori Montgomery is upset with herself because she had been off plan over the past few weeks and gained 2 pounds.  However she has been on plan since Saturday when she went to the grocery store to get the food she needed.  Hunger is well controlled but she has  cravings in the evening. She has history of lap band and then revision with lap band removal and gastric bypass.  High weight was 283, nadir 240 pounds.  She still has portion restriction from the bypass.  Assessment/Plan:  1. Prediabetes A1c is now at goal-5.5.Marland Kitchen  Was 5.8 in September 22 when she started our program. Medication(s): Mounjaro 15 mg weekly.  Tolerating well.  Also on metformin 500 mg once daily. Lab Results  Component Value Date   HGBA1C 5.5 01/29/2022   Lab Results  Component Value Date   INSULIN 13.7 01/29/2022   INSULIN 8.2 09/02/2021   INSULIN 18.6 04/17/2021    Plan: Refill Mounjaro 15 mg weekly.   2. Eating disorder/emotional eating Lori Montgomery has had issues with stress/emotional eating in the  evening. Currently this is poorly controlled. Overall mood is stable.  She reports she does not have bipolar disorder and is unsure why this is on her chart.  She has anxiety and she notes this is currently well controlled with BuSpar and Lexapro. Medication(s): Had been on Topamax 25 mg twice daily but stopped due to side effects-dysgeusia, eye twitching.  She noticed more cravings without it so she started taking 1 dose in the morning which she is tolerating well..  Plan: Continue Topamax 25 mg daily with supper.  3. Obesity: Current BMI 33.8 Lori Montgomery is currently in the action stage of change. As such, her goal is to continue with weight loss efforts.  She has agreed to the Category 3 Plan.   Exercise goals: as is  Behavioral modification strategies: increasing lean protein intake, decreasing simple carbohydrates, and keeping healthy foods in the home.  Lori Montgomery has agreed to follow-up with our clinic in 2 weeks.   No orders of the defined types were placed in this encounter.   Medications Discontinued During This Encounter  Medication Reason   tirzepatide [facility]) 15 MG/0.5ML Pen Reorder   metFORMIN (GLUCOPHAGE) 500 MG tablet Reorder     Meds ordered this encounter  Medications   tirzepatide (MOUNJARO) 15 MG/0.5ML Pen    Sig: Inject 15 mg into the skin once a week.    Dispense:  2 mL    Refill:  0    Order Specific Question:   Supervising Provider    Answer:   Dennard Nip D [id number]  metFORMIN (GLUCOPHAGE) 500 MG tablet    Sig: Take 1 tablet (500 mg total) by mouth daily with breakfast.    Dispense:  30 tablet    Refill:  0    Order Specific Question:   Supervising Provider    Answer:   Dennard Nip D [[id number]]   topiramate (TOPAMAX) 25 MG tablet    Sig: Take 1 tablet (25 mg total) by mouth daily with supper.    Dispense:  30 tablet    Refill:  0    Order Specific Question:   Supervising Provider    Answer:   Dennard Nip D [[id number]]      Objective:   VITALS: Per  patient if applicable, see vitals. GENERAL: Alert and in no acute distress. CARDIOPULMONARY: No increased WOB. Speaking in clear sentences.  PSYCH: Pleasant and cooperative. Speech normal rate and rhythm. Affect is appropriate. Insight and judgement are appropriate. Attention is focused, linear, and appropriate.  NEURO: Oriented as arrived to appointment on time with no prompting.   Lab Results  Component Value Date   CREATININE 0.85 01/29/2022   BUN 13 01/29/2022   NA 142 01/29/2022   K 3.8 01/29/2022   CL 102 01/29/2022   CO2 24 01/29/2022   Lab Results  Component Value Date   ALT 16 01/29/2022   AST 16 01/29/2022   ALKPHOS 60 01/29/2022   BILITOT 0.6 01/29/2022   Lab Results  Component Value Date   HGBA1C 5.5 01/29/2022   HGBA1C 5.6 09/02/2021   HGBA1C 5.8 (H) 04/17/2021   Lab Results  Component Value Date   INSULIN 13.7 01/29/2022   INSULIN 8.2 09/02/2021   INSULIN 18.6 04/17/2021   Lab Results  Component Value Date   TSH 4.240 04/17/2021   Lab Results  Component Value Date   CHOL 178 01/29/2022   HDL 55 01/29/2022   LDLCALC 111 (H) 01/29/2022   TRIG 65 01/29/2022   CHOLHDL 4.0 11/01/2013   Lab Results  Component Value Date   WBC 6.1 04/17/2021   HGB 13.2 04/17/2021   HCT 41.7 04/17/2021   MCV 91 04/17/2021   PLT 245 04/17/2021   No results found for: "IRON", "TIBC", "FERRITIN" Lab Results  Component Value Date   VD25OH 26.0 (L) 01/29/2022   VD25OH 39.5 09/02/2021   VD25OH 25.2 (L) 04/17/2021    Attestation Statements:   Reviewed by clinician on day of visit: allergies, medications, problem list, medical history, surgical history, family history, social history, and previous encounter notes.

## 2022-02-17 ENCOUNTER — Encounter (INDEPENDENT_AMBULATORY_CARE_PROVIDER_SITE_OTHER): Payer: Self-pay | Admitting: Family Medicine

## 2022-02-17 ENCOUNTER — Other Ambulatory Visit (INDEPENDENT_AMBULATORY_CARE_PROVIDER_SITE_OTHER): Payer: Self-pay | Admitting: Family Medicine

## 2022-02-17 ENCOUNTER — Other Ambulatory Visit (HOSPITAL_COMMUNITY): Payer: Self-pay

## 2022-02-17 DIAGNOSIS — R7303 Prediabetes: Secondary | ICD-10-CM

## 2022-02-18 ENCOUNTER — Encounter (INDEPENDENT_AMBULATORY_CARE_PROVIDER_SITE_OTHER): Payer: Self-pay

## 2022-02-18 ENCOUNTER — Other Ambulatory Visit (HOSPITAL_COMMUNITY): Payer: Self-pay

## 2022-02-18 ENCOUNTER — Other Ambulatory Visit (INDEPENDENT_AMBULATORY_CARE_PROVIDER_SITE_OTHER): Payer: Self-pay | Admitting: Family Medicine

## 2022-02-18 ENCOUNTER — Telehealth (INDEPENDENT_AMBULATORY_CARE_PROVIDER_SITE_OTHER): Payer: Self-pay | Admitting: Family Medicine

## 2022-02-18 DIAGNOSIS — R7303 Prediabetes: Secondary | ICD-10-CM

## 2022-02-18 MED ORDER — WEGOVY 2.4 MG/0.75ML ~~LOC~~ SOAJ
2.4000 mg | SUBCUTANEOUS | 0 refills | Status: DC
  Filled 2022-02-18: qty 3, 28d supply, fill #0

## 2022-02-18 NOTE — Telephone Encounter (Signed)
Pt called to say WEGOVY is covered by insurance as long as her X4I is a certain level and she is taking metformin.  Pt requesting stop the [place] process and start PA for [facility]

## 2022-02-18 NOTE — Telephone Encounter (Signed)
Please review

## 2022-02-18 NOTE — Telephone Encounter (Signed)
Appeal letter has been faxed to [facility]:  [phone].

## 2022-02-18 NOTE — Telephone Encounter (Signed)
Please advise 

## 2022-02-18 NOTE — Telephone Encounter (Signed)
Dawn [employer] - Prior authorization denied for [employer]. Per insurance: plan exclusion/not a covered benefit. Patient sent denial message via mychart.

## 2022-02-19 ENCOUNTER — Other Ambulatory Visit (HOSPITAL_COMMUNITY): Payer: Self-pay

## 2022-02-19 ENCOUNTER — Other Ambulatory Visit (INDEPENDENT_AMBULATORY_CARE_PROVIDER_SITE_OTHER): Payer: Self-pay | Admitting: Family Medicine

## 2022-02-19 DIAGNOSIS — R7303 Prediabetes: Secondary | ICD-10-CM

## 2022-02-19 MED ORDER — OZEMPIC (2 MG/DOSE) 8 MG/3ML ~~LOC~~ SOPN
PEN_INJECTOR | SUBCUTANEOUS | 0 refills | Status: DC
  Filled 2022-02-19: qty 3, 28d supply, fill #0

## 2022-02-19 MED ORDER — SEMAGLUTIDE (2 MG/DOSE) 8 MG/3ML ~~LOC~~ SOPN
2.0000 mg | PEN_INJECTOR | SUBCUTANEOUS | 0 refills | Status: DC
  Filled 2022-02-19: qty 3, 28d supply, fill #0
  Filled 2022-02-20: qty 3, 5d supply, fill #0
  Filled 2022-02-20: qty 3, 28d supply, fill #0

## 2022-02-20 ENCOUNTER — Other Ambulatory Visit (HOSPITAL_COMMUNITY): Payer: Self-pay

## 2022-02-22 ENCOUNTER — Encounter (INDEPENDENT_AMBULATORY_CARE_PROVIDER_SITE_OTHER): Payer: Self-pay | Admitting: Family Medicine

## 2022-02-23 ENCOUNTER — Telehealth (INDEPENDENT_AMBULATORY_CARE_PROVIDER_SITE_OTHER): Payer: Self-pay | Admitting: Family Medicine

## 2022-02-23 ENCOUNTER — Encounter (INDEPENDENT_AMBULATORY_CARE_PROVIDER_SITE_OTHER): Payer: Self-pay

## 2022-02-23 NOTE — Telephone Encounter (Signed)
Lori Montgomery - Prior authorization denied for [employer]. Per insurance: patient does not have type 2 diabetes. Patient sent denial message via mychart.

## 2022-02-25 ENCOUNTER — Telehealth (INDEPENDENT_AMBULATORY_CARE_PROVIDER_SITE_OTHER): Payer: Self-pay

## 2022-02-25 NOTE — Telephone Encounter (Addendum)
Appeal Received: 1 week ago [name], [name], FNP  [name], CMA Hi,  [name] was denied for this patient.  She had been on [facility] but her coupon finally ran out.  I wrote a letter for the appeal that is in communications in her chart.  Please include this in the appeal.   02/25/2022  Appeal letter for Lori Montgomery has been faxed and confirmation has been received.

## 2022-02-25 NOTE — Telephone Encounter (Signed)
[name], [name], FNP  [name], CMA  They have now denied Ozempic for this patient and she would like to appeal.  I have written a new letter for the appeal if you will include it.  This letter mentions Ozempic in the body of the letter rather than [facility].   02/25/2022  Appeal letter has been faxed for Ozempic. Fax confirmation received.

## 2022-03-04 ENCOUNTER — Encounter (INDEPENDENT_AMBULATORY_CARE_PROVIDER_SITE_OTHER): Payer: Self-pay | Admitting: Nurse Practitioner

## 2022-03-04 ENCOUNTER — Ambulatory Visit (INDEPENDENT_AMBULATORY_CARE_PROVIDER_SITE_OTHER): Payer: 59 | Admitting: Nurse Practitioner

## 2022-03-04 VITALS — BP 107/73 | HR 69 | Temp 97.7°F | Ht 68.0 in | Wt 222.0 lb

## 2022-03-04 DIAGNOSIS — E669 Obesity, unspecified: Secondary | ICD-10-CM

## 2022-03-04 DIAGNOSIS — R7303 Prediabetes: Secondary | ICD-10-CM

## 2022-03-04 DIAGNOSIS — Z6833 Body mass index (BMI) 33.0-33.9, adult: Secondary | ICD-10-CM

## 2022-03-04 MED ORDER — CONTRAVE 8-90 MG PO TB12: ORAL_TABLET | ORAL | 0 refills | Status: DC

## 2022-03-04 MED ORDER — METFORMIN HCL 500 MG PO TABS: 500.0000 mg | ORAL_TABLET | Freq: Every day | ORAL | 0 refills | Status: DC

## 2022-03-05 ENCOUNTER — Telehealth (INDEPENDENT_AMBULATORY_CARE_PROVIDER_SITE_OTHER): Payer: Self-pay | Admitting: Nurse Practitioner

## 2022-03-05 ENCOUNTER — Encounter (INDEPENDENT_AMBULATORY_CARE_PROVIDER_SITE_OTHER): Payer: Self-pay

## 2022-03-05 NOTE — Telephone Encounter (Signed)
Lori Montgomery - Prior authorization denied for Contrave. Per insurance: not a covered benefit/plan exclusion. Patient sent denial message.

## 2022-03-09 NOTE — Progress Notes (Unsigned)
Chief Complaint:   OBESITY Ladean is here to discuss her progress with her obesity treatment plan along with follow-up of her obesity related diagnoses. Trysten is on the Category 3 Plan and states she is following her eating plan approximately 0% of the time. Lenka states she is walking 30 minutes 5 times per week.  Today's visit was #: 18 Starting weight: 290 lbs Starting date: 04/17/2021 Today's weight: 222 lbs Today's date: 03/04/2022 Total lbs lost to date: 68 lbs Total lbs lost since last in-office visit: 0  Interim History: Avie has been traveling for the last past 2 weeks. She has not been able to eat on plan due to traveling. She has no vacations upcoming. She has tried Phentermine; Topamax. She stopped Topamax due to side effects. Drinking water and sodas.   Subjective:   1. Pre-diabetes Donia is currently taking Metformin 500 mg. Denies any side effects. She recently stopped Mounjaro due to cost.  Assessment/Plan:   1. Pre-diabetes Refill Metformin 500 mg by mouth daily for 1 month with 0 refills.  Jovon will continue to work on weight loss, exercise, and decreasing simple carbohydrates to help decrease the risk of diabetes.    -Refill metFORMIN (GLUCOPHAGE) 500 MG tablet; Take 1 tablet (500 mg total) by mouth daily with breakfast.  Dispense: 30 tablet; Refill: 0  2. Obesity with current BMI of 33.8 START Contrave 8-90 mg by mouth as directed for 1 month with 0 refills.  Side effects discussed.  Patient to avoid narcotics and alcohol while taking.    -Start Naltrexone-buPROPion HCl ER (CONTRAVE) 8-90 MG TB12; Start 1 tablet every morning for 7 days, then 1 tablet twice daily for 7 days, then 2 tablet am and 1 pm for 7 days and then 2 tablets every morning and 2 every evening  Dispense: 120 tablet; Refill: 0  Tora is currently in the action stage of change. As such, her goal is to continue with weight loss efforts. She has agreed to following a lower carbohydrate, vegetable  and lean protein rich diet plan.   Exercise goals: As is.  Behavioral modification strategies: increasing lean protein intake, increasing water intake, and no skipping meals.  Kayonna has agreed to follow-up with our clinic in 2 weeks. She was informed of the importance of frequent follow-up visits to maximize her success with intensive lifestyle modifications for her multiple health conditions.   Objective:   Blood pressure 107/73, pulse 69, temperature 97.7 F (36.5 C), height '5\' 8"'$  (1.727 m), weight 222 lb (100.7 kg), SpO2 99 %. Body mass index is 33.75 kg/m.  General: Cooperative, alert, well developed, in no acute distress. HEENT: Conjunctivae and lids unremarkable. Cardiovascular: Regular rhythm.  Lungs: Normal work of breathing. Neurologic: No focal deficits.   Lab Results  Component Value Date   CREATININE 0.85 01/29/2022   BUN 13 01/29/2022   NA 142 01/29/2022   K 3.8 01/29/2022   CL 102 01/29/2022   CO2 24 01/29/2022   Lab Results  Component Value Date   ALT 16 01/29/2022   AST 16 01/29/2022   ALKPHOS 60 01/29/2022   BILITOT 0.6 01/29/2022   Lab Results  Component Value Date   HGBA1C 5.5 01/29/2022   HGBA1C 5.6 09/02/2021   HGBA1C 5.8 (H) 04/17/2021   Lab Results  Component Value Date   INSULIN 13.7 01/29/2022   INSULIN 8.2 09/02/2021   INSULIN 18.6 04/17/2021   Lab Results  Component Value Date   TSH 4.240 04/17/2021  Lab Results  Component Value Date   CHOL 178 01/29/2022   HDL 55 01/29/2022   LDLCALC 111 (H) 01/29/2022   TRIG 65 01/29/2022   CHOLHDL 4.0 11/01/2013   Lab Results  Component Value Date   VD25OH 26.0 (L) 01/29/2022   VD25OH 39.5 09/02/2021   VD25OH 25.2 (L) 04/17/2021   Lab Results  Component Value Date   WBC 6.1 04/17/2021   HGB 13.2 04/17/2021   HCT 41.7 04/17/2021   MCV 91 04/17/2021   PLT 245 04/17/2021   No results found for: "IRON", "TIBC", "FERRITIN"  Attestation Statements:   Reviewed by clinician on day of  visit: allergies, medications, problem list, medical history, surgical history, family history, social history, and previous encounter notes.  I, Brendell Tyus, RMA, am acting as transcriptionist for Everardo Pacific, FNP.  I have reviewed the above documentation for accuracy and completeness, and I agree with the above. Everardo Pacific, FNP

## 2022-03-17 NOTE — Progress Notes (Deleted)
TeleHealth Visit:  This visit was completed with telemedicine (audio/video) technology. [name] has verbally consented to this TeleHealth visit. The patient is located at home, the provider is located at home. The participants in this visit include the listed provider and patient. The visit was conducted today via MyChart video.  OBESITY Lori Montgomery is here to discuss her progress with her obesity treatment plan along with follow-up of her obesity related diagnoses.   Today's visit was # 19 Starting weight: 290 lbs Starting date: 04/17/2021 Weight at last in office visit: 222 lbs on 03/04/22 Total weight loss: 68 lbs at last in office visit on 03/04/22. Today's reported weight: *** lbs No weight reported.  Nutrition Plan: following a lower carbohydrate, vegetable and lean protein rich diet plan.  Hunger is {EWCONTROLASSESSMENT:24261}. Cravings are {EWCONTROLASSESSMENT:24261}.  Current exercise: {exercise types:16438}  Interim History: ***  Assessment/Plan:  1. ***  2. ***  3. ***  Obesity: Current BMI *** Lori Montgomery {CHL AMB IS/IS NOT:210130109} currently in the action stage of change. As such, her goal is to {MWMwtloss#1:210800005}.  She has agreed to {MWMwtlossportion/plan2:23431}.   Exercise goals: {MWM EXERCISE RECS:23473}  Behavioral modification strategies: {MWMwtlossdietstrategies3:23432}.  [name] has agreed to follow-up with our clinic in {NUMBER 1-10:22536} weeks.   No orders of the defined types were placed in this encounter.   There are no discontinued medications.   No orders of the defined types were placed in this encounter.     Objective:   VITALS: Per patient if applicable, see vitals. GENERAL: Alert and in no acute distress. CARDIOPULMONARY: No increased WOB. Speaking in clear sentences.  PSYCH: Pleasant and cooperative. Speech normal rate and rhythm. Affect is appropriate. Insight and judgement are appropriate. Attention is focused, linear, and appropriate.   NEURO: Oriented as arrived to appointment on time with no prompting.   Lab Results  Component Value Date   CREATININE 0.85 01/29/2022   BUN 13 01/29/2022   NA 142 01/29/2022   K 3.8 01/29/2022   CL 102 01/29/2022   CO2 24 01/29/2022   Lab Results  Component Value Date   ALT 16 01/29/2022   AST 16 01/29/2022   ALKPHOS 60 01/29/2022   BILITOT 0.6 01/29/2022   Lab Results  Component Value Date   HGBA1C 5.5 01/29/2022   HGBA1C 5.6 09/02/2021   HGBA1C 5.8 (H) 04/17/2021   Lab Results  Component Value Date   INSULIN 13.7 01/29/2022   INSULIN 8.2 09/02/2021   INSULIN 18.6 04/17/2021   Lab Results  Component Value Date   TSH 4.240 04/17/2021   Lab Results  Component Value Date   CHOL 178 01/29/2022   HDL 55 01/29/2022   LDLCALC 111 (H) 01/29/2022   TRIG 65 01/29/2022   CHOLHDL 4.0 11/01/2013   Lab Results  Component Value Date   WBC 6.1 04/17/2021   HGB 13.2 04/17/2021   HCT 41.7 04/17/2021   MCV 91 04/17/2021   PLT 245 04/17/2021   No results found for: "IRON", "TIBC", "FERRITIN" Lab Results  Component Value Date   VD25OH 26.0 (L) 01/29/2022   VD25OH 39.5 09/02/2021   VD25OH 25.2 (L) 04/17/2021    Attestation Statements:   Reviewed by clinician on day of visit: allergies, medications, problem list, medical history, surgical history, family history, social history, and previous encounter notes.  ***(delete if time-based billing not used) Time spent on visit including the items listed below was *** minutes.  -preparing to see the patient (e.g., review of tests, history, previous notes) -obtaining and/or  reviewing separately obtained history -counseling and educating the patient/family/caregiver -documenting clinical information in the electronic or other health record -ordering medications, tests, or procedures -independently interpreting results and communicating results to the patient/ family/caregiver -referring and communicating with other health  care professionals  -care coordination

## 2022-03-18 ENCOUNTER — Encounter (INDEPENDENT_AMBULATORY_CARE_PROVIDER_SITE_OTHER): Payer: Self-pay

## 2022-03-18 ENCOUNTER — Telehealth (INDEPENDENT_AMBULATORY_CARE_PROVIDER_SITE_OTHER): Payer: 59 | Admitting: Family Medicine

## 2022-03-23 ENCOUNTER — Other Ambulatory Visit (INDEPENDENT_AMBULATORY_CARE_PROVIDER_SITE_OTHER): Payer: Self-pay | Admitting: Family Medicine

## 2022-03-23 ENCOUNTER — Encounter (INDEPENDENT_AMBULATORY_CARE_PROVIDER_SITE_OTHER): Payer: Self-pay | Admitting: Family Medicine

## 2022-03-23 ENCOUNTER — Other Ambulatory Visit (HOSPITAL_COMMUNITY): Payer: Self-pay

## 2022-03-23 DIAGNOSIS — R7303 Prediabetes: Secondary | ICD-10-CM

## 2022-03-23 MED ORDER — OZEMPIC (2 MG/DOSE) 8 MG/3ML ~~LOC~~ SOPN
2.0000 mg | PEN_INJECTOR | SUBCUTANEOUS | 0 refills | Status: DC
  Filled 2022-03-23 – 2022-03-24 (×2): qty 3, 28d supply, fill #0

## 2022-03-24 ENCOUNTER — Other Ambulatory Visit (HOSPITAL_COMMUNITY): Payer: Self-pay

## 2022-03-26 ENCOUNTER — Encounter (INDEPENDENT_AMBULATORY_CARE_PROVIDER_SITE_OTHER): Payer: Self-pay

## 2022-03-26 ENCOUNTER — Telehealth (INDEPENDENT_AMBULATORY_CARE_PROVIDER_SITE_OTHER): Payer: Self-pay | Admitting: Family Medicine

## 2022-03-26 NOTE — Telephone Encounter (Signed)
Lori Montgomery - Prior authorization denied for [employer]. Per insurance: patient does not have type 2 diabetes. Patient sent denial message via mychart.

## 2022-04-01 ENCOUNTER — Ambulatory Visit (INDEPENDENT_AMBULATORY_CARE_PROVIDER_SITE_OTHER): Payer: 59 | Admitting: Nurse Practitioner

## 2022-04-02 ENCOUNTER — Other Ambulatory Visit (INDEPENDENT_AMBULATORY_CARE_PROVIDER_SITE_OTHER): Payer: Self-pay | Admitting: Nurse Practitioner

## 2022-04-02 DIAGNOSIS — R7303 Prediabetes: Secondary | ICD-10-CM

## 2022-04-16 ENCOUNTER — Encounter (INDEPENDENT_AMBULATORY_CARE_PROVIDER_SITE_OTHER): Payer: Self-pay | Admitting: Family Medicine

## 2022-04-23 ENCOUNTER — Other Ambulatory Visit (HOSPITAL_COMMUNITY): Payer: Self-pay

## 2022-11-25 ENCOUNTER — Other Ambulatory Visit (HOSPITAL_COMMUNITY): Payer: Self-pay

## 2022-11-25 MED ORDER — ZEPBOUND 5 MG/0.5ML ~~LOC~~ SOAJ
5.0000 mg | SUBCUTANEOUS | 1 refills | Status: DC
  Filled 2022-11-25 – 2023-05-17 (×2): qty 2, 28d supply, fill #0

## 2022-12-11 ENCOUNTER — Other Ambulatory Visit (HOSPITAL_COMMUNITY): Payer: Self-pay

## 2022-12-14 ENCOUNTER — Other Ambulatory Visit (HOSPITAL_COMMUNITY): Payer: Self-pay

## 2022-12-22 ENCOUNTER — Other Ambulatory Visit (HOSPITAL_COMMUNITY): Payer: Self-pay

## 2022-12-29 ENCOUNTER — Other Ambulatory Visit: Payer: Self-pay | Admitting: Obstetrics and Gynecology

## 2022-12-29 DIAGNOSIS — Z803 Family history of malignant neoplasm of breast: Secondary | ICD-10-CM

## 2023-01-05 ENCOUNTER — Other Ambulatory Visit (HOSPITAL_COMMUNITY): Payer: Self-pay

## 2023-01-07 ENCOUNTER — Other Ambulatory Visit (HOSPITAL_COMMUNITY): Payer: Self-pay

## 2023-02-05 ENCOUNTER — Other Ambulatory Visit: Payer: Self-pay | Admitting: Family

## 2023-02-05 DIAGNOSIS — N61 Mastitis without abscess: Secondary | ICD-10-CM

## 2023-02-08 ENCOUNTER — Telehealth: Payer: Self-pay | Admitting: Neurology

## 2023-02-08 ENCOUNTER — Ambulatory Visit: Payer: 59 | Admitting: Neurology

## 2023-02-08 ENCOUNTER — Encounter: Payer: Self-pay | Admitting: Neurology

## 2023-02-08 NOTE — Telephone Encounter (Signed)
NS for new consult.

## 2023-02-12 ENCOUNTER — Ambulatory Visit
Admission: RE | Admit: 2023-02-12 | Discharge: 2023-02-12 | Disposition: A | Discharge status: Home / Self Care | Payer: 59 | Source: Ambulatory Visit | Attending: Family | Admitting: Family

## 2023-02-12 DIAGNOSIS — N61 Mastitis without abscess: Secondary | ICD-10-CM

## 2023-03-25 ENCOUNTER — Ambulatory Visit: Payer: 59 | Admitting: Neurology

## 2023-05-05 ENCOUNTER — Inpatient Hospital Stay: Admission: RE | Admit: 2023-05-05 | Payer: 59 | Source: Ambulatory Visit

## 2023-05-17 ENCOUNTER — Other Ambulatory Visit (HOSPITAL_COMMUNITY): Payer: Self-pay

## 2023-05-18 ENCOUNTER — Other Ambulatory Visit (HOSPITAL_COMMUNITY): Payer: Self-pay

## 2023-05-19 ENCOUNTER — Other Ambulatory Visit (HOSPITAL_COMMUNITY): Payer: Self-pay

## 2023-05-25 ENCOUNTER — Other Ambulatory Visit (HOSPITAL_COMMUNITY): Payer: Self-pay

## 2023-05-25 MED ORDER — ZEPBOUND 10 MG/0.5ML ~~LOC~~ SOAJ
10.0000 mg | SUBCUTANEOUS | 0 refills | Status: DC
  Filled 2023-05-25: qty 2, 28d supply, fill #0

## 2023-05-25 MED ORDER — ONDANSETRON HCL 4 MG PO TABS
4.0000 mg | ORAL_TABLET | Freq: Three times a day (TID) | ORAL | 0 refills | Status: DC | PRN
  Filled 2023-05-25: qty 30, 10d supply, fill #0

## 2023-05-27 ENCOUNTER — Ambulatory Visit
Admission: RE | Admit: 2023-05-27 | Discharge: 2023-05-27 | Disposition: A | Discharge status: Home / Self Care | Payer: 59 | Source: Ambulatory Visit | Attending: Obstetrics and Gynecology | Admitting: Obstetrics and Gynecology

## 2023-05-27 DIAGNOSIS — Z803 Family history of malignant neoplasm of breast: Secondary | ICD-10-CM

## 2023-05-27 MED ORDER — GADOPICLENOL 0.5 MMOL/ML IV SOLN
9.0000 mL | Freq: Once | INTRAVENOUS | Status: AC | PRN
  Administered 2023-05-27: 9 mL via INTRAVENOUS

## 2023-05-28 ENCOUNTER — Other Ambulatory Visit (HOSPITAL_COMMUNITY): Payer: Self-pay

## 2023-05-29 ENCOUNTER — Other Ambulatory Visit (HOSPITAL_COMMUNITY): Payer: Self-pay

## 2023-05-30 ENCOUNTER — Ambulatory Visit: Admission: EM | Admit: 2023-05-30 | Discharge: 2023-05-30 | Discharge status: Against Medical Advice | Payer: 59

## 2023-06-05 ENCOUNTER — Other Ambulatory Visit (HOSPITAL_COMMUNITY): Payer: Self-pay

## 2023-07-23 ENCOUNTER — Other Ambulatory Visit: Payer: Self-pay | Admitting: Orthopedic Surgery

## 2023-07-26 LAB — SURGICAL PATHOLOGY

## 2023-08-25 ENCOUNTER — Telehealth: Payer: Self-pay | Admitting: Emergency Medicine

## 2023-08-25 DIAGNOSIS — E7849 Other hyperlipidemia: Secondary | ICD-10-CM

## 2023-08-25 NOTE — Telephone Encounter (Signed)
 Dr Alvis Ba spoke with this patient over the phone. Discussed ordering a CT Calcium  Score.  Croitoru, Karyl Paget, MD  Marlon Simpson, RN Can we please order a coronary calcium  score for her? Thanks

## 2023-09-27 ENCOUNTER — Ambulatory Visit (HOSPITAL_BASED_OUTPATIENT_CLINIC_OR_DEPARTMENT_OTHER)
Admission: RE | Admit: 2023-09-27 | Discharge: 2023-09-27 | Disposition: A | Discharge status: Home / Self Care | Payer: Self-pay | Source: Ambulatory Visit | Attending: Cardiovascular Disease | Admitting: Cardiovascular Disease

## 2023-09-27 ENCOUNTER — Encounter: Payer: Self-pay | Admitting: Cardiovascular Disease

## 2023-09-27 DIAGNOSIS — I2510 Atherosclerotic heart disease of native coronary artery without angina pectoris: Secondary | ICD-10-CM

## 2023-09-27 DIAGNOSIS — E7849 Other hyperlipidemia: Secondary | ICD-10-CM

## 2023-09-27 DIAGNOSIS — E785 Hyperlipidemia, unspecified: Secondary | ICD-10-CM

## 2023-09-27 MED ORDER — ROSUVASTATIN CALCIUM 10 MG PO TABS
10.0000 mg | ORAL_TABLET | Freq: Every day | ORAL | 3 refills | Status: DC
Start: 2023-09-27 — End: 2024-05-10

## 2023-10-01 ENCOUNTER — Ambulatory Visit: Payer: 59 | Admitting: Family

## 2023-10-01 ENCOUNTER — Encounter: Payer: Self-pay | Admitting: Family

## 2023-10-01 VITALS — BP 119/72 | HR 66 | Temp 98.0°F | Resp 16 | Ht 67.5 in | Wt 205.0 lb

## 2023-10-01 DIAGNOSIS — E66811 Obesity, class 1: Secondary | ICD-10-CM

## 2023-10-01 DIAGNOSIS — M25569 Pain in unspecified knee: Secondary | ICD-10-CM

## 2023-10-01 DIAGNOSIS — I10 Essential (primary) hypertension: Secondary | ICD-10-CM

## 2023-10-01 DIAGNOSIS — F32A Depression, unspecified: Secondary | ICD-10-CM

## 2023-10-01 DIAGNOSIS — Z9884 Bariatric surgery status: Secondary | ICD-10-CM

## 2023-10-01 DIAGNOSIS — G8929 Other chronic pain: Secondary | ICD-10-CM

## 2023-10-01 DIAGNOSIS — L659 Nonscarring hair loss, unspecified: Secondary | ICD-10-CM | POA: Insufficient documentation

## 2023-10-01 DIAGNOSIS — Z0000 Encounter for general adult medical examination without abnormal findings: Secondary | ICD-10-CM | POA: Insufficient documentation

## 2023-10-01 DIAGNOSIS — F419 Anxiety disorder, unspecified: Secondary | ICD-10-CM

## 2023-10-01 DIAGNOSIS — K219 Gastro-esophageal reflux disease without esophagitis: Secondary | ICD-10-CM | POA: Insufficient documentation

## 2023-10-01 DIAGNOSIS — Z23 Encounter for immunization: Secondary | ICD-10-CM | POA: Diagnosis not present

## 2023-10-01 DIAGNOSIS — G4700 Insomnia, unspecified: Secondary | ICD-10-CM | POA: Diagnosis not present

## 2023-10-01 DIAGNOSIS — R0602 Shortness of breath: Secondary | ICD-10-CM | POA: Insufficient documentation

## 2023-10-01 DIAGNOSIS — E785 Hyperlipidemia, unspecified: Secondary | ICD-10-CM | POA: Diagnosis not present

## 2023-10-01 DIAGNOSIS — M25562 Pain in left knee: Secondary | ICD-10-CM | POA: Insufficient documentation

## 2023-10-01 DIAGNOSIS — E559 Vitamin D deficiency, unspecified: Secondary | ICD-10-CM

## 2023-10-01 DIAGNOSIS — E7849 Other hyperlipidemia: Secondary | ICD-10-CM

## 2023-10-01 DIAGNOSIS — R7303 Prediabetes: Secondary | ICD-10-CM

## 2023-10-01 DIAGNOSIS — R5383 Other fatigue: Secondary | ICD-10-CM | POA: Insufficient documentation

## 2023-10-01 MED ORDER — OZEMPIC (1 MG/DOSE) 4 MG/3ML ~~LOC~~ SOPN: PEN_INJECTOR | SUBCUTANEOUS | Status: DC

## 2023-10-01 MED ORDER — BUPROPION HCL ER (XL) 300 MG PO TB24: 300.0000 mg | ORAL_TABLET | Freq: Every day | ORAL | Status: DC

## 2023-10-01 NOTE — Assessment & Plan Note (Signed)
Using valium prn.   Contract signed, update UDS.

## 2023-10-01 NOTE — Assessment & Plan Note (Signed)
Check labs as ordered.  Suspect grief reaction from losing her mother recently along with stress is the primary culprit. I suggested that she establish with a counselor and I have given her information on scheduling.

## 2023-10-01 NOTE — Assessment & Plan Note (Signed)
Stable- on aldactone, proscar which is being managed by dermatology.

## 2023-10-01 NOTE — Progress Notes (Signed)
Subjective:     Patient ID: Lori Montgomery, female    DOB: 1971/04/26, 53 y.o.   MRN: [id number]  Chief Complaint  Patient presents with   New Patient (Initial Visit)    HPI  Discussed the use of AI scribe software for clinical note transcription with the patient, who gave verbal consent to proceed.  History of Present Illness   Estephania Montgomery is a 53 year old female who presents with fatigue and concerns about cardiac health. She was referred by her sister's cardiologist for evaluation of cardiac health.  She has been experiencing significant fatigue for the past month to month and a half, despite taking vitamin D supplements (50,000 units weekly for two months). She is concerned about her cardiac health after a recent calcium scoring test showed she was in the 85th percentile. She has a follow-up scheduled in three months with her cardiologist, who was also her mother's cardiologist.  She has a history of smoking but quit five years ago. She is currently taking Crestor, which was recently prescribed due to her cardiac CT findings. She is also taking hydrochlorothiazide 12.5 mg for blood pressure, finasteride for hair loss, and escitalopram for depression and anxiety. She takes buspirone and bupropion for anxiety and depression, and diazepam occasionally for sleep.  She has been using semaglutide for weight loss, which has been effective, resulting in a loss of almost 100 pounds. She previously had a lap band and gastric bypass but did not lose weight with those procedures.  She has a history of constipation, which she attributes to past bulimia and laxative use. She takes eight Senna Plus tablets every few days to manage this. She has had polyps removed during colonoscopies and has a family history of colon cancer, which she monitors with regular screenings.  She reports a history of mild sleep apnea diagnosed through a sleep study, but she has not yet obtained a CPAP machine due to cost  concerns. She attributes her sleep disturbances to her three dogs sleeping on her bed rather than the apnea itself.  She has a history of granuloma annulare, which manifests as overgeneration of skin cells, primarily on her legs.  She also has a history of knee pain due to ACL replacement and meniscus repairs, but this has improved significantly with weight loss.  She experiences reflux and manages it with dietary adjustments and positional changes during sleep. She has a history of gallbladder removal.  She has a history of depression and anxiety, for which she takes multiple medications. She has experienced two anxiety attacks in the past six months, which she managed with an additional dose of buspirone. She is currently dealing with grief following her mother's recent passing, which has been a significant source of stress.       [facility]   Health Maintenance Due  Topic Date Due   HIV Screening  Never done   Hepatitis C Screening  Never done   Cervical Cancer Screening (HPV/Pap Cotest)  12/04/2017   DTaP/Tdap/Td (2 - Td or Tdap) 07/22/2021   COVID-19 Vaccine (1 - 2024-25 season) Never done    Past Medical History:  Diagnosis Date   Anxiety    Bilateral knee pain    Constipation    Depression    GERD (gastroesophageal reflux disease)    Granuloma annulare    Hypertension    IBS (irritable bowel syndrome)    Morbid obesity with BMI of 40.0-44.9, adult (HCC)    Other fatigue  PONV (postoperative nausea and vomiting)    states also had localized reaction to IV meds during/following endoscopy 2011 requiring IV Benadryl- ? location of test   Swallowing difficulty    Vitamin D deficiency     Past Surgical History:  Procedure Laterality Date   ABDOMINAL HYSTERECTOMY N/A 07/11/2015   Procedure: HYSTERECTOMY ABDOMINAL;  Surgeon: Zelphia Cairo, MD;  Location: [facility];  Service: Gynecology;  Laterality: N/A;   BILATERAL SALPINGECTOMY  07/11/2015   Procedure:  BILATERAL SALPINGECTOMY;  Surgeon: Zelphia Cairo, MD;  Location: [facility];  Service: Gynecology;;   BREAST REDUCTION SURGERY  08/10/2006   with scar revision   BREAST SURGERY     CYST REMOVAL HAND     DIAGNOSTIC LAPAROSCOPY     DILATION AND CURETTAGE OF UTERUS     x 3 for miscarriages   GASTRIC ROUX-EN-Y N/A 02/27/2014   Procedure:  LAPAROSCOPIC REMOVAL OF LAP BAND AND PORT,LAPAROSCOPIC ROUX-EN-Y GASTRIC BYPASS WITH UPPER ENDOSCOPY, ;  Surgeon: Atilano Ina, MD;  Location: [facility];  Service: General;  Laterality: N/A;   KNEE ARTHROSCOPY     right knee/ with ACL REPAIR   LAPAROSCOPIC CHOLECYSTECTOMY  03/10/2010   Dr Michaell Cowing   LAPAROSCOPIC GASTRIC BANDING N/A 10/04/2012   Procedure: LAPAROSCOPIC GASTRIC BANDING;  Surgeon: Atilano Ina, MD,FACS;  Location: [facility];  Service: General;  Laterality: N/A;  Laparoscopic Adjustable Gastric Banding,    MESH APPLIED TO LAP PORT N/A 10/04/2012   Procedure: MESH APPLIED TO LAP PORT;  Surgeon: Atilano Ina, MD,FACS;  Location: [facility];  Service: General;  Laterality: N/A;    Family History  Problem Relation Age of Onset   Obesity Mother    Eating disorder Mother    Depression Mother    Stroke Mother    Hyperlipidemia Mother    Hypertension Mother    Diabetes Mother        stroke, hypertension   Heart failure Mother    Sleep apnea Father    Depression Father    Kidney disease Father    Hyperlipidemia Father    Hypertension Father    Diabetes Father        minor heart attack   Lung cancer Maternal Grandmother    Heart attack Maternal Grandfather    Heart disease Paternal Grandmother        MI   Heart disease Paternal Grandfather        MI   Breast cancer Maternal Aunt    Kidney disease Other    Depression Other    Sleep apnea Other    Eating disorder Other    Obesity Other     Social History   Socioeconomic History   Marital status: Married    Spouse name: Renae Fickle   Number of children: 4   Years of education: Not on file    Highest education level: Not on file  Occupational History   Occupation: Investment banker, corporate: [employer]  Tobacco Use   Smoking status: Former    Current packs/day: 0.00    Average packs/day: 0.5 packs/day for 33.0 years (17.1 ttl pk-yrs)    Types: Cigarettes    Start date: 09/10/1987    Quit date: 08/10/2019    Years since quitting: 4.1   Smokeless tobacco: Never   Tobacco comments:    1 pack/week (05/10/14)  Vaping Use   Vaping status: Never Used  Substance and Sexual Activity   Alcohol use: Yes    Comment: rarely  Drug use: No   Sexual activity: Yes    Partners: Male  Other Topics Concern   Not on file  Social History Narrative   One son- 2001  works for [employer], lives on his own   On daughter- 2006 senior in [employer]   Married   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)   Completed some college   Enjoys crafts   3 dogs   Social Drivers of Corporate investment banker Strain: Not on [employer] Insecurity: Not on file  Transportation Needs: Not on file  Physical Activity: Not on file  Stress: Not on file  Social Connections: Unknown (12/15/2021)   Received from [facility], [facility]   Social Network    Social Network: Not on file  Intimate Partner Violence: Unknown (11/10/2021)   Received from [facility], [facility]   HITS    Physically Hurt: Not on file    Insult or Talk Down To: Not on file    Threaten Physical Harm: Not on file    Scream or Curse: Not on file    Outpatient Medications Prior to Visit  Medication Sig Dispense Refill   busPIRone (BUSPAR) 10 MG tablet Take 10 mg by mouth 2 (two) times daily.     diazepam (VALIUM) 10 MG tablet Take 10 mg by mouth as needed for anxiety.     escitalopram (LEXAPRO) 20 MG tablet Take 20 mg by mouth daily.     finasteride (PROSCAR) 5 MG tablet Take 2.5 mg by mouth daily.     hydrochlorothiazide (HYDRODIURIL) 12.5 MG tablet Take 12.5 mg by mouth daily.     rosuvastatin (CRESTOR) 10 MG tablet  Take 1 tablet (10 mg total) by mouth daily. 90 tablet 3   spironolactone (ALDACTONE) 100 MG tablet Take 100 mg by mouth daily.     metFORMIN (GLUCOPHAGE) 500 MG tablet Take 1 tablet (500 mg total) by mouth daily with breakfast. 30 tablet 0   Naltrexone-buPROPion HCl ER (CONTRAVE) 8-90 MG TB12 Start 1 tablet every morning for 7 days, then 1 tablet twice daily for 7 days, then 2 tablet am and 1 pm for 7 days and then 2 tablets every morning and 2 every evening 120 tablet 0   topiramate (TOPAMAX) 25 MG tablet Take 1 tablet (25 mg total) by mouth daily with supper. 30 tablet 0   celecoxib (CELEBREX) 200 MG capsule Take 200 mg by mouth as needed.     furosemide (LASIX) 20 MG tablet Take 20 mg by mouth as needed.     hydrochlorothiazide (HYDRODIURIL) 25 MG tablet Take 25 mg by mouth daily.     ondansetron (ZOFRAN) 4 MG tablet Take 1 tablet (4 mg total) by mouth every 8 (eight) hours as needed for nausea 30 tablet 0   Semaglutide, 2 MG/DOSE, (OZEMPIC, 2 MG/DOSE,) 8 MG/3ML SOPN Inject 2 mg as directed once a week. 3 mL 0   tirzepatide (ZEPBOUND) 5 MG/0.5ML Pen Inject 5 mg into the skin once a week. 2 mL 1   Vitamin D, Ergocalciferol, (DRISDOL) 1.25 MG (50000 UNIT) CAPS capsule Take 1 capsule (50,000 Units total) by mouth every 7 (seven) days. 4 capsule 0   ZEPBOUND 10 MG/0.5ML Pen Inject 10 mg into the skin once a week. 2 mL 0   No facility-administered medications prior to visit.    Allergies  Allergen Reactions   Morphine Itching    Other reaction(s): Other (See Comments)    ROS See  HPI    Objective:    Physical Exam   BP 119/72 (BP Location: Right Arm, Patient Position: Sitting, Cuff Size: Large)   Pulse 66   Temp 98 F (36.7 C) (Oral)   Resp 16   Ht 5' 7.5" (1.715 m)   Wt 205 lb (93 kg)   SpO2 100%   BMI 31.63 kg/m  Wt Readings from Last 3 Encounters:  10/01/23 205 lb (93 kg)  03/04/22 222 lb (100.7 kg)  01/29/22 222 lb (100.7 kg)  Physical Exam  Constitutional: She is  oriented to person, place, and time. She appears well-developed and well-nourished. No distress.  HENT:  Head: Normocephalic and atraumatic.  Right Ear: Tympanic membrane and ear canal normal.  Left Ear: Tympanic membrane and ear canal normal.  Mouth/Throat: Oropharynx is clear and moist.  Eyes: Pupils are equal, round, and reactive to light. No scleral icterus.  Neck: Normal range of motion. No thyromegaly present.  Cardiovascular: Normal rate and regular rhythm.   No murmur heard. Pulmonary/Chest: Effort normal and breath sounds normal. No respiratory distress. He has no wheezes. She has no rales. She exhibits no tenderness.  Abdominal: Soft. Bowel sounds are normal. She exhibits no distension and no mass. There is no tenderness. There is no rebound and no guarding.  Musculoskeletal: She exhibits no edema.  Lymphadenopathy:    She has no cervical adenopathy.  Neurological: She is alert and oriented to person, place, and time. She has normal patellar reflexes. She exhibits normal muscle tone. Coordination normal.  Skin: Skin is warm and dry.  Psychiatric: She has a normal mood and affect. Her behavior is normal. Judgment and thought content normal.            Assessment & Plan:        Assessment & Plan:   Problem List Items Addressed This Visit       Unprioritized   Vitamin D deficiency   Update level.        Relevant Orders   VITAMIN D 25 Hydroxy (Vit-D Deficiency, Fractures)   Preventative health care     General Health Maintenance -Administer shingles vaccine today and second dose in 3 months. -Continue regular mammograms and colonoscopies due to family history of colon cancer and personal history of polyps. -will request mammo/colo reports from her previous provider.  -flu shot up to date -recommended covid vaccine at pharmacy.  -Consider genetic counseling due to positive BRCA gene.       Prediabetes   Check A1C.       Relevant Orders   HgB A1c    Other hyperlipidemia   Update lipid panel.       Relevant Medications   hydrochlorothiazide (HYDRODIURIL) 12.5 MG tablet   spironolactone (ALDACTONE) 100 MG tablet   Obesity (BMI 30.0-34.9)   She continues to lose weight with compounded semaglutide which is being prescribed by an online physician.       Knee pain   Much improved with weight loss.       Insomnia   Using valium prn.   Contract signed, update UDS.      Hypertension   Bp stable on hydrochlorothiazide, continue same.       Relevant Medications   hydrochlorothiazide (HYDRODIURIL) 12.5 MG tablet   spironolactone (ALDACTONE) 100 MG tablet   Hair loss   Stable- on aldactone, proscar which is being managed by dermatology.       Fatigue   Check labs as ordered.  Suspect grief reaction  from losing her mother recently along with stress is the primary culprit. I suggested that she establish with a counselor and I have given her information on scheduling.       Relevant Orders   CBC w/Diff   Comp Met (CMET)   TSH   Bilateral knee pain   Improved following weight loss.       Anxiety and depression   Stable on wellbutrin, lexapro, valium. Controlled substance contract is updated today and UDS will be performed.       Relevant Medications   buPROPion (WELLBUTRIN XL) 300 MG 24 hr tablet   Other Relevant Orders   DRUG MONITORING, PANEL 8 WITH CONFIRMATION, URINE   Other Visit Diagnoses       Hyperlipidemia, unspecified hyperlipidemia type    -  Primary   Relevant Medications   hydrochlorothiazide (HYDRODIURIL) 12.5 MG tablet   spironolactone (ALDACTONE) 100 MG tablet   Other Relevant Orders   Lipid panel     Bariatric surgery status       Relevant Orders   B12 and Folate Panel     Need for shingles vaccine       Relevant Orders   Zoster Recombinant (Shingrix ) (Completed)       I have discontinued Maddeline Churchill's celecoxib, furosemide, Vitamin D (Ergocalciferol), topiramate, metFORMIN, Contrave,  Ozempic (2 MG/DOSE), Zepbound, ondansetron, and Zepbound. I am also having her start on buPROPion and Ozempic (1 MG/DOSE). Additionally, I am having her maintain her escitalopram, diazepam, busPIRone, finasteride, rosuvastatin, hydrochlorothiazide, and spironolactone.  Meds ordered this encounter  Medications   buPROPion (WELLBUTRIN XL) 300 MG 24 hr tablet    Sig: Take 1 tablet (300 mg total) by mouth daily.    Supervising Provider:   Danise Edge A [[id number]]   Semaglutide, 1 MG/DOSE, (OZEMPIC, 1 MG/DOSE,) 4 MG/3ML SOPN    Sig: Using compounded per online provider    Supervising Provider:   Danise Edge A [id number]

## 2023-10-01 NOTE — Assessment & Plan Note (Signed)
 Much improved with weight loss

## 2023-10-01 NOTE — Assessment & Plan Note (Signed)
 Update lipid panel.

## 2023-10-01 NOTE — Assessment & Plan Note (Signed)
 Check A1C

## 2023-10-01 NOTE — Assessment & Plan Note (Signed)
Update level.  

## 2023-10-01 NOTE — Assessment & Plan Note (Signed)
Stable on wellbutrin, lexapro, valium. Controlled substance contract is updated today and UDS will be performed.

## 2023-10-01 NOTE — Patient Instructions (Signed)
VISIT SUMMARY:  Today, we discussed your recent fatigue, weight management, cardiovascular risk, constipation, depression and anxiety, and gastroesophageal reflux disease (GERD). We also reviewed your general health maintenance, including vaccinations and regular screenings.  YOUR PLAN:  -FATIGUE: Fatigue is a feeling of extreme tiredness. We will check your Vitamin D level today and, if normal, start you on a maintenance dose of 3,000 units daily. We will reassess your fatigue in 3 months.  -WEIGHT MANAGEMENT: You have successfully lost weight with semaglutide and wish to discontinue metformin and topiramate. We will stop these medications as per your preference and continue with semaglutide.  -CARDIOVASCULAR RISK: A high cardiac calcium score indicates a higher risk of heart disease. You have been prescribed Crestor to manage this risk. We will draw your cholesterol levels today to assess your LDL and adjust the Crestor dose if necessary.  -CONSTIPATION: Constipation is difficulty in passing stools. You are currently managing this with Senna Plus, and we will continue with this management plan.  -DEPRESSION/ANXIETY: Depression and anxiety are mental health conditions that affect mood and behavior. You are managing these with escitalopram, buspirone, and bupropion, and occasionally using diazepam for sleep or severe anxiety attacks. We recommend considering counseling for grief and stress management.  -GASTROESOPHAGEAL REFLUX DISEASE (GERD): GERD is a condition where stomach acid frequently flows back into the tube connecting your mouth and stomach. You are managing this with dietary adjustments and positional changes during sleep, and we will continue with this management plan.  -GENERAL HEALTH MAINTENANCE: We will administer the shingles vaccine today and the second dose in 3 months. Continue with regular mammograms and colonoscopies due to your family history of colon cancer and personal  history of polyps. Consider genetic counseling due to your positive BRCA gene.

## 2023-10-01 NOTE — Assessment & Plan Note (Signed)
Improved following weight loss.

## 2023-10-01 NOTE — Assessment & Plan Note (Signed)
Bp stable on hydrochlorothiazide, continue same.

## 2023-10-01 NOTE — Assessment & Plan Note (Signed)
   General Health Maintenance -Administer shingles vaccine today and second dose in 3 months. -Continue regular mammograms and colonoscopies due to family history of colon cancer and personal history of polyps. -will request mammo/colo reports from her previous provider.  -flu shot up to date -recommended covid vaccine at pharmacy.  -Consider genetic counseling due to positive BRCA gene.

## 2023-10-01 NOTE — Assessment & Plan Note (Signed)
She continues to lose weight with compounded semaglutide which is being prescribed by an online physician.

## 2023-10-02 LAB — COMPREHENSIVE METABOLIC PANEL
AG Ratio: 2 (calc) (ref 1.0–2.5)
ALT: 11 U/L (ref 6–29)
AST: 13 U/L (ref 10–35)
Albumin: 4.5 g/dL (ref 3.6–5.1)
Alkaline phosphatase (APISO): 54 U/L (ref 37–153)
BUN: 15 mg/dL (ref 7–25)
CO2: 23 mmol/L (ref 20–32)
Calcium: 9.9 mg/dL (ref 8.6–10.4)
Chloride: 107 mmol/L (ref 98–110)
Creat: 0.84 mg/dL (ref 0.50–1.03)
Globulin: 2.3 g/dL (ref 1.9–3.7)
Glucose, Bld: 81 mg/dL (ref 65–99)
Potassium: 3.9 mmol/L (ref 3.5–5.3)
Sodium: 141 mmol/L (ref 135–146)
Total Bilirubin: 0.3 mg/dL (ref 0.2–1.2)
Total Protein: 6.8 g/dL (ref 6.1–8.1)

## 2023-10-02 LAB — CBC WITH DIFFERENTIAL/PLATELET
Absolute Lymphocytes: 3522 {cells}/uL (ref 850–3900)
Absolute Monocytes: 398 {cells}/uL (ref 200–950)
Basophils Absolute: 28 {cells}/uL (ref 0–200)
Basophils Relative: 0.4 %
Eosinophils Absolute: 270 {cells}/uL (ref 15–500)
Eosinophils Relative: 3.8 %
HCT: 41.8 % (ref 35.0–45.0)
Hemoglobin: 13.6 g/dL (ref 11.7–15.5)
MCH: 30.8 pg (ref 27.0–33.0)
MCHC: 32.5 g/dL (ref 32.0–36.0)
MCV: 94.8 fL (ref 80.0–100.0)
MPV: 11.3 fL (ref 7.5–12.5)
Monocytes Relative: 5.6 %
Neutro Abs: 2883 {cells}/uL (ref 1500–7800)
Neutrophils Relative %: 40.6 %
Platelets: 233 10*3/uL (ref 140–400)
RBC: 4.41 10*6/uL (ref 3.80–5.10)
RDW: 11.8 % (ref 11.0–15.0)
Total Lymphocyte: 49.6 %
WBC: 7.1 10*3/uL (ref 3.8–10.8)

## 2023-10-02 LAB — HEMOGLOBIN A1C
Hgb A1c MFr Bld: 5.5 %{Hb} (ref <5.7)
Mean Plasma Glucose: 111 mg/dL
eAG (mmol/L): 6.2 mmol/L

## 2023-10-02 LAB — LIPID PANEL
Cholesterol: 177 mg/dL (ref <200)
HDL: 57 mg/dL (ref >=50)
LDL Cholesterol (Calc): 104 mg/dL — ABNORMAL HIGH
Non-HDL Cholesterol (Calc): 120 mg/dL (ref <130)
Total CHOL/HDL Ratio: 3.1 (calc) (ref <5.0)
Triglycerides: 75 mg/dL (ref <150)

## 2023-10-02 LAB — TSH: TSH: 2.05 m[IU]/L

## 2023-10-02 LAB — B12 AND FOLATE PANEL
Folate: 10.4 ng/mL
Vitamin B-12: 277 pg/mL (ref 200–1100)

## 2023-10-02 LAB — VITAMIN D 25 HYDROXY (VIT D DEFICIENCY, FRACTURES): Vit D, 25-Hydroxy: 54 ng/mL (ref 30–100)

## 2023-10-03 LAB — DRUG MONITORING, PANEL 8 WITH CONFIRMATION, URINE
6 Acetylmorphine: NEGATIVE ng/mL (ref <10)
Alcohol Metabolites: NEGATIVE ng/mL (ref <500)
Alphahydroxyalprazolam: NEGATIVE ng/mL (ref <25)
Alphahydroxymidazolam: NEGATIVE ng/mL (ref <50)
Alphahydroxytriazolam: NEGATIVE ng/mL (ref <50)
Aminoclonazepam: NEGATIVE ng/mL (ref <25)
Amphetamines: NEGATIVE ng/mL (ref <500)
Benzodiazepines: POSITIVE ng/mL — AB (ref <100)
Buprenorphine, Urine: NEGATIVE ng/mL (ref <5)
Cocaine Metabolite: NEGATIVE ng/mL (ref <150)
Creatinine: 125.3 mg/dL (ref >=20.0)
Hydroxyethylflurazepam: NEGATIVE ng/mL (ref <50)
Lorazepam: NEGATIVE ng/mL (ref <50)
MDMA: NEGATIVE ng/mL (ref <500)
Marijuana Metabolite: NEGATIVE ng/mL (ref <20)
Nordiazepam: NEGATIVE ng/mL (ref <50)
Opiates: NEGATIVE ng/mL (ref <100)
Oxazepam: 100 ng/mL — ABNORMAL HIGH (ref <50)
Oxidant: NEGATIVE ug/mL (ref <200)
Oxycodone: NEGATIVE ng/mL (ref <100)
Temazepam: 84 ng/mL — ABNORMAL HIGH (ref <50)
pH: 7.2 (ref 4.5–9.0)

## 2023-10-03 LAB — DM TEMPLATE

## 2023-10-04 ENCOUNTER — Encounter: Payer: Self-pay | Admitting: Family

## 2023-10-06 ENCOUNTER — Encounter: Payer: Self-pay | Admitting: Family

## 2023-10-10 ENCOUNTER — Encounter: Payer: Self-pay | Admitting: Family

## 2023-10-10 DIAGNOSIS — G4733 Obstructive sleep apnea (adult) (pediatric): Secondary | ICD-10-CM | POA: Insufficient documentation

## 2023-10-10 DIAGNOSIS — E538 Deficiency of other specified B group vitamins: Secondary | ICD-10-CM | POA: Insufficient documentation

## 2023-10-13 NOTE — Telephone Encounter (Signed)
 Please schedule ECG treadmill stress test

## 2023-10-25 NOTE — Telephone Encounter (Signed)
 Thank you.  I could have sworn I put it in

## 2023-10-26 NOTE — Telephone Encounter (Signed)
 Message sent to scheduler about ETT

## 2023-11-09 ENCOUNTER — Ambulatory Visit: Attending: Cardiovascular Disease

## 2023-11-09 DIAGNOSIS — E785 Hyperlipidemia, unspecified: Secondary | ICD-10-CM | POA: Diagnosis not present

## 2023-11-09 DIAGNOSIS — I2510 Atherosclerotic heart disease of native coronary artery without angina pectoris: Secondary | ICD-10-CM | POA: Diagnosis not present

## 2023-11-09 LAB — EXERCISE TOLERANCE TEST
Angina Index: 0
Estimated workload: 8.4
Exercise duration (min): 6 min
Exercise duration (sec): 58 s
MPHR: 167 {beats}/min
Peak HR: 150 {beats}/min
Percent HR: 89 %
RPE: 16
Rest HR: 69 {beats}/min

## 2023-11-10 ENCOUNTER — Encounter: Payer: Self-pay | Admitting: Cardiovascular Disease

## 2023-11-12 ENCOUNTER — Encounter: Payer: Self-pay | Admitting: Family

## 2023-11-12 ENCOUNTER — Other Ambulatory Visit: Payer: Self-pay | Admitting: Family

## 2023-11-12 DIAGNOSIS — F32A Depression, unspecified: Secondary | ICD-10-CM

## 2023-11-15 ENCOUNTER — Other Ambulatory Visit (HOSPITAL_COMMUNITY): Payer: Self-pay

## 2023-11-15 ENCOUNTER — Other Ambulatory Visit: Payer: Self-pay

## 2023-11-15 ENCOUNTER — Other Ambulatory Visit (HOSPITAL_BASED_OUTPATIENT_CLINIC_OR_DEPARTMENT_OTHER): Payer: Self-pay

## 2023-11-15 MED ORDER — BUSPIRONE HCL 10 MG PO TABS
10.0000 mg | ORAL_TABLET | Freq: Two times a day (BID) | ORAL | 1 refills | Status: DC
Start: 2023-11-15 — End: 2024-04-03
  Filled 2023-11-15: qty 60, 30d supply, fill #0
  Filled 2023-12-15 – 2024-01-24 (×4): qty 60, 30d supply, fill #1

## 2023-11-15 MED ORDER — BUPROPION HCL ER (XL) 300 MG PO TB24
300.0000 mg | ORAL_TABLET | Freq: Every day | ORAL | 1 refills | Status: DC
  Filled 2023-11-15 – 2023-12-13 (×3): qty 30, 30d supply, fill #0
  Filled 2024-01-10 – 2024-01-24 (×3): qty 30, 30d supply, fill #1

## 2023-11-15 MED ORDER — DIAZEPAM 10 MG PO TABS
10.0000 mg | ORAL_TABLET | ORAL | 0 refills | Status: AC | PRN
  Filled 2023-11-15: qty 30, 30d supply, fill #0

## 2023-11-15 MED ORDER — HYDROCHLOROTHIAZIDE 12.5 MG PO TABS
12.5000 mg | ORAL_TABLET | Freq: Every day | ORAL | 1 refills | Status: DC
  Filled 2023-11-15: qty 30, 30d supply, fill #0
  Filled 2023-11-25: qty 90, 90d supply, fill #0
  Filled 2023-11-29: qty 30, 30d supply, fill #0
  Filled 2023-12-15: qty 90, 90d supply, fill #0
  Filled 2024-01-10 – 2024-01-24 (×3): qty 30, 30d supply, fill #0

## 2023-11-15 MED ORDER — ESCITALOPRAM OXALATE 20 MG PO TABS
20.0000 mg | ORAL_TABLET | Freq: Every day | ORAL | 1 refills | Status: DC
Start: 2023-11-15 — End: 2024-02-25
  Filled 2023-11-15: qty 30, 30d supply, fill #0
  Filled 2023-11-25 (×2): qty 90, 90d supply, fill #0
  Filled 2023-12-09: qty 30, 30d supply, fill #0
  Filled 2024-01-10 – 2024-01-24 (×3): qty 30, 30d supply, fill #1

## 2023-11-15 NOTE — Telephone Encounter (Signed)
 Requesting: Valium 10 mg Contract: 10/01/2023 UDS: 10/01/2023 Last Visit: 10/01/2023 Next Visit: 12/31/2023 Last Refill: Historical, Provider   Please Advise

## 2023-11-22 ENCOUNTER — Telehealth: Payer: Self-pay | Admitting: Neurology

## 2023-11-22 NOTE — Telephone Encounter (Signed)
 Copied from CRM [id number]. Topic: Clinical - Medication Question >> Nov 22, 2023  2:53 PM Aisha D wrote: Reason for CRM: Patient is calling regarding the medication refill request for escitalopram (LEXAPRO) 20 MG tablet, diazepam (VALIUM) 10 MG tablet, busPIRone (BUSPAR) 10 MG tablet, hydrochlorothiazide (HYDRODIURIL) 12.5 MG tablet and buPROPion (WELLBUTRIN XL) 300 MG 24 hr tablet. Patient stated that the pharmacy [facility] stated that they do not have the medications in their system. Patient stated that she has been calling since 11/12/23 and would like an update.

## 2023-11-22 NOTE — Telephone Encounter (Signed)
 Looks like these were sent to the [facility].

## 2023-11-25 ENCOUNTER — Other Ambulatory Visit (HOSPITAL_BASED_OUTPATIENT_CLINIC_OR_DEPARTMENT_OTHER): Payer: Self-pay

## 2023-11-29 ENCOUNTER — Other Ambulatory Visit (HOSPITAL_BASED_OUTPATIENT_CLINIC_OR_DEPARTMENT_OTHER): Payer: Self-pay

## 2023-12-06 ENCOUNTER — Encounter: Payer: Self-pay | Admitting: Family

## 2023-12-06 DIAGNOSIS — E65 Localized adiposity: Secondary | ICD-10-CM

## 2023-12-07 NOTE — Addendum Note (Signed)
 Addended by: Dorrene Gaucher on: 12/07/2023 10:33 AM   Modules accepted: Orders

## 2023-12-09 ENCOUNTER — Other Ambulatory Visit: Payer: Self-pay

## 2023-12-09 ENCOUNTER — Other Ambulatory Visit (HOSPITAL_BASED_OUTPATIENT_CLINIC_OR_DEPARTMENT_OTHER): Payer: Self-pay

## 2023-12-10 ENCOUNTER — Other Ambulatory Visit (HOSPITAL_BASED_OUTPATIENT_CLINIC_OR_DEPARTMENT_OTHER): Payer: Self-pay

## 2023-12-13 ENCOUNTER — Other Ambulatory Visit (HOSPITAL_BASED_OUTPATIENT_CLINIC_OR_DEPARTMENT_OTHER): Payer: Self-pay

## 2023-12-15 ENCOUNTER — Other Ambulatory Visit (HOSPITAL_BASED_OUTPATIENT_CLINIC_OR_DEPARTMENT_OTHER): Payer: Self-pay

## 2023-12-17 ENCOUNTER — Other Ambulatory Visit: Payer: Self-pay

## 2023-12-17 NOTE — Telephone Encounter (Signed)
 Pt has not been seen since 2015. Pt is not an established pt with [facility]. Would Dr. Alvis Ba like to refill this medication without pt being seen? Please address

## 2023-12-27 ENCOUNTER — Other Ambulatory Visit (HOSPITAL_BASED_OUTPATIENT_CLINIC_OR_DEPARTMENT_OTHER): Payer: Self-pay

## 2023-12-29 ENCOUNTER — Ambulatory Visit (INDEPENDENT_AMBULATORY_CARE_PROVIDER_SITE_OTHER): Admitting: Plastic Surgery

## 2023-12-29 ENCOUNTER — Encounter: Payer: Self-pay | Admitting: Plastic Surgery

## 2023-12-29 VITALS — BP 116/80 | HR 65 | Ht 67.5 in | Wt 212.4 lb

## 2023-12-29 DIAGNOSIS — E65 Localized adiposity: Secondary | ICD-10-CM

## 2023-12-29 DIAGNOSIS — M793 Panniculitis, unspecified: Secondary | ICD-10-CM | POA: Diagnosis not present

## 2023-12-29 NOTE — Progress Notes (Signed)
 Referring Provider Dorrene Gaucher, NP [address] [place],  [place] [address]   CC:  Chief Complaint  Patient presents with   Advice Only      Lori Montgomery is an 53 y.o. female.  HPI: Lori Montgomery is a 53 year old female who presents today with complaints of ongoing rashes on the posterior aspect of her pannus and in the intertriginous regions.  Patient underwent gastric bypass 7 to 8 years ago and has had a 86 pound weight loss resulting in excess skin on the anterior portion of her abdominal wall.  She states that she frequently has rashes and boils in this area and that the excess skin makes it difficult for her to find close fit appropriately.  Additionally the excess skin interferes with her ability to bend over.  Allergies  Allergen Reactions   Morphine  Itching    Other reaction(s): Other (See Comments)    Outpatient Encounter Medications as of 12/29/2023  Medication Sig   buPROPion  (WELLBUTRIN  XL) 300 MG 24 hr tablet Take 1 tablet (300 mg total) by mouth daily.   busPIRone  (BUSPAR ) 10 MG tablet Take 1 tablet (10 mg total) by mouth 2 (two) times daily.   diazepam  (VALIUM ) 10 MG tablet Take 1 tablet (10 mg total) by mouth as needed for anxiety.   escitalopram  (LEXAPRO ) 20 MG tablet Take 1 tablet (20 mg total) by mouth daily.   finasteride (PROSCAR) 5 MG tablet Take 2.5 mg by mouth daily.   hydrochlorothiazide  (HYDRODIURIL ) 12.5 MG tablet Take 1 tablet (12.5 mg total) by mouth daily.   Semaglutide , 1 MG/DOSE, (OZEMPIC , 1 MG/DOSE,) 4 MG/3ML SOPN Using compounded per online provider   spironolactone (ALDACTONE) 100 MG tablet Take 100 mg by mouth daily.   rosuvastatin  (CRESTOR ) 10 MG tablet Take 1 tablet (10 mg total) by mouth daily.   No facility-administered encounter medications on file as of 12/29/2023.     Past Medical History:  Diagnosis Date   Anxiety    B12 deficiency    Bilateral knee pain    Constipation    Depression    Depression    GERD  (gastroesophageal reflux disease)    Granuloma annulare    Hypertension    IBS (irritable bowel syndrome)    Morbid obesity with BMI of 40.0-44.9, adult (HCC)    OSA (obstructive sleep apnea)    Other fatigue    PONV (postoperative nausea and vomiting)    states also had localized reaction to IV meds during/following endoscopy 2011 requiring IV Benadryl - ? location of test   Swallowing difficulty    Vitamin D  deficiency     Past Surgical History:  Procedure Laterality Date   ABDOMINAL HYSTERECTOMY N/A 07/11/2015   Procedure: HYSTERECTOMY ABDOMINAL;  Surgeon: Ashby Lawman, MD;  Location: [facility];  Service: Gynecology;  Laterality: N/A;   BILATERAL SALPINGECTOMY  07/11/2015   Procedure: BILATERAL SALPINGECTOMY;  Surgeon: Ashby Lawman, MD;  Location: [facility];  Service: Gynecology;;   BREAST REDUCTION SURGERY  08/10/2006   with scar revision   BREAST SURGERY     CYST REMOVAL HAND     DIAGNOSTIC LAPAROSCOPY     DILATION AND CURETTAGE OF UTERUS     x 3 for miscarriages   GASTRIC ROUX-EN-Y N/A 02/27/2014   Procedure:  LAPAROSCOPIC REMOVAL OF LAP BAND AND PORT,LAPAROSCOPIC ROUX-EN-Y GASTRIC BYPASS WITH UPPER ENDOSCOPY, ;  Surgeon: Fran Imus, MD;  Location: [facility];  Service: General;  Laterality: N/A;   KNEE ARTHROSCOPY  right knee/ with ACL REPAIR   LAPAROSCOPIC CHOLECYSTECTOMY  03/10/2010   Dr Hershell Lose   LAPAROSCOPIC GASTRIC BANDING N/A 10/04/2012   Procedure: LAPAROSCOPIC GASTRIC BANDING;  Surgeon: Fran Imus, MD,FACS;  Location: [facility];  Service: General;  Laterality: N/A;  Laparoscopic Adjustable Gastric Banding,    MESH APPLIED TO LAP PORT N/A 10/04/2012   Procedure: MESH APPLIED TO LAP PORT;  Surgeon: Fran Imus, MD,FACS;  Location: [facility];  Service: General;  Laterality: N/A;    Family History  Problem Relation Age of Onset   Obesity Mother    Eating disorder Mother    Depression Mother    Stroke Mother    Hyperlipidemia Mother    Hypertension Mother     Diabetes Mother        stroke, hypertension   Heart failure Mother    Sleep apnea Father    Depression Father    Kidney disease Father    Hyperlipidemia Father    Hypertension Father    Diabetes Father        minor heart attack   Lung cancer Maternal Grandmother    Heart attack Maternal Grandfather    Heart disease Paternal Grandmother        MI   Heart disease Paternal Grandfather        MI   Breast cancer Maternal Aunt    Kidney disease Other    Depression Other    Sleep apnea Other    Eating disorder Other    Obesity Other     Social History   Social History Narrative   One son- 2001  works for [employer], lives on his own   On daughter- 2006 senior in [employer]   Married   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  There are no rashes today but she states that she just finished a course of nystatin powder.  Physical Exam    12/29/2023    1:25 PM 10/01/2023    2:25 PM 03/04/2022    8:00 AM  Vitals with BMI  Height 5' 7.5" 5' 7.5" 5\' 8"   Weight 212 lbs 6 oz 205 lbs 222 lbs  BMI 32.76 31.62 33.76  Systolic 116 119 161  Diastolic 80 72 73  Pulse 65 66 69    General:  No acute distress,  Alert and oriented, Non-Toxic, Normal speech and affect Abdomen: Patient has a moderate-sized pannus which hangs to the level of the symphysis pubis.  There is scarring on the posterior aspect of the pannus where the patient apparently has had rashes and infections in the past.  There is a small tattoo over the right lower abdominal wall. Mammogram: July 2024 BI-RADS 2 Assessment/Plan Panniculitis: Patient has a history of ongoing infections on the posterior aspect of her pannus and in the intertriginous regions.  She is  requesting surgical excision of the skin on her anterior abdominal wall.  I believe that she would be a good candidate for this.  We discussed the location of the incisions and the unpredictable nature of scarring and wound healing.  She understands there is a risk of bleeding, infection, and seroma formation.  She understands that because of the chronic infections on the  posterior aspect of the pannus she is at a much higher risk for wound healing problems most of which can be addressed with simple dressing changes.  She understands that she will need drains postoperatively and that she will wear a compressive garment for 6 weeks after surgery.  Her postoperative limitations will include no heavy lifting greater than 20 pounds, no vigorous activity, no submerging the incisions in water  for 6 weeks.  She may return to light activity as tolerated.  She will be requested to begin ambulation immediately after surgery to help decrease the risk of DVT.  All questions were answered to her satisfaction.  Photographs were obtained with her consent.  Will submit her for a panniculectomy at her request.  Teretha Ferguson 12/29/2023, 3:27 PM

## 2023-12-31 ENCOUNTER — Ambulatory Visit: Payer: 59 | Admitting: Family

## 2024-01-01 ENCOUNTER — Encounter: Payer: Self-pay | Admitting: Family

## 2024-01-10 ENCOUNTER — Other Ambulatory Visit (HOSPITAL_BASED_OUTPATIENT_CLINIC_OR_DEPARTMENT_OTHER): Payer: Self-pay

## 2024-01-11 ENCOUNTER — Other Ambulatory Visit: Payer: Self-pay

## 2024-01-11 ENCOUNTER — Other Ambulatory Visit (HOSPITAL_BASED_OUTPATIENT_CLINIC_OR_DEPARTMENT_OTHER): Payer: Self-pay

## 2024-01-24 ENCOUNTER — Other Ambulatory Visit (HOSPITAL_BASED_OUTPATIENT_CLINIC_OR_DEPARTMENT_OTHER): Payer: Self-pay

## 2024-01-24 ENCOUNTER — Ambulatory Visit: Payer: Self-pay

## 2024-01-24 NOTE — Telephone Encounter (Signed)
 FYI Only or Action Required?: FYI only for provider  Patient was last seen in primary care on 10/01/2023 by Dorrene Gaucher, NP. Called Nurse Triage reporting Mass. Symptoms began yesterday. Interventions attempted: Nothing. Symptoms are: unchanged.  Triage Disposition: Home Care  Patient/caregiver understands and will follow disposition?: No, wishes to speak with PCP  Copied from CRM [id number]. Topic: Clinical - Red Word Triage >> Jan 24, 2024 12:08 PM Mesmerise C wrote: [facility] that prompted transfer to Nurse Triage: Patient stated she has a lump about the size of a pea right under her chin felt for the first time yesterday only tender to the touch Reason for Disposition  [1] Small swelling or lump AND [2] unexplained AND [3] present < 1 week  Answer Assessment - Initial Assessment Questions 1. APPEARANCE of SWELLING: What does it look like?     Can't see it, only feel it 2. SIZE: How large is the swelling? (e.g., inches, cm; or compare to size of pinhead, tip of pen, eraser, coin, pea, grape, ping pong ball)      pea 3. LOCATION: Where is the swelling located?     Under chin 4. ONSET: When did the swelling start?     unsure 5. COLOR: What color is it? Is there more than one color?     No discoloration 6. PAIN: Is there any pain? If Yes, ask: How bad is the pain? (e.g., scale 1-10; or mild, moderate, severe)     - NONE (0): no pain   - MILD (1-3): doesn't interfere with normal activities    - MODERATE (4-7): interferes with normal activities or awakens from sleep    - SEVERE (8-10): excruciating pain, unable to do any normal activities     No pain if not touched 7. ITCH: Does it itch? If Yes, ask: How bad is the itch?      denies 8. CAUSE: What do you think caused the swelling? unsure 9 OTHER SYMPTOMS: Do you have any other symptoms? (e.g., fever)     denies  Protocols used: Skin Lump or Localized Swelling-A-AH

## 2024-01-24 NOTE — Telephone Encounter (Signed)
 Call disconnected during transfer to triage from Specialist. Attempted a callback x 1, no answer.              Copied from CRM [id number]. Topic: Clinical - Red Word Triage >> Jan 24, 2024 12:08 PM Mesmerise C wrote: [facility] that prompted transfer to Nurse Triage: Patient stated she has a lump about the size of a pea right under her chin felt for the first time yesterday only tender to the touch

## 2024-01-25 ENCOUNTER — Other Ambulatory Visit: Payer: Self-pay

## 2024-01-25 ENCOUNTER — Encounter: Payer: Self-pay | Admitting: Family Medicine

## 2024-01-25 ENCOUNTER — Other Ambulatory Visit (HOSPITAL_BASED_OUTPATIENT_CLINIC_OR_DEPARTMENT_OTHER): Payer: Self-pay

## 2024-01-25 ENCOUNTER — Ambulatory Visit (INDEPENDENT_AMBULATORY_CARE_PROVIDER_SITE_OTHER): Admitting: Family Medicine

## 2024-01-25 VITALS — BP 126/80 | HR 93 | Temp 97.7°F | Resp 16 | Ht 67.0 in | Wt 211.0 lb

## 2024-01-25 DIAGNOSIS — K1120 Sialoadenitis, unspecified: Secondary | ICD-10-CM

## 2024-01-25 DIAGNOSIS — K120 Recurrent oral aphthae: Secondary | ICD-10-CM | POA: Diagnosis not present

## 2024-01-25 MED ORDER — VALACYCLOVIR HCL 1 G PO TABS
2000.0000 mg | ORAL_TABLET | ORAL | 1 refills | Status: DC | PRN
  Filled 2024-01-25 (×2): qty 30, 30d supply, fill #0

## 2024-01-25 NOTE — Patient Instructions (Signed)
OK to take Tylenol 1000 mg (2 extra strength tabs) or 975 mg (3 regular strength tabs) every 6 hours as needed. ? ?Ice/cold pack over area for 10-15 min twice daily. ? ?Let us know if you need anything. ?

## 2024-01-25 NOTE — Progress Notes (Signed)
 Chief Complaint  Patient presents with   Mass    Lump Left Side Face    Lori Montgomery is a 53 y.o. female here for a skin complaint.  Duration: 2 days Location: L under jaw Pruritic? No Painful? Only if she touches it Drainage? No Trauma? No Other associated symptoms: canker sore preceded it Therapies tried thus far: none  Past Medical History:  Diagnosis Date   Anxiety    B12 deficiency    Bilateral knee pain    Constipation    Depression    Depression    GERD (gastroesophageal reflux disease)    Granuloma annulare    Hypertension    IBS (irritable bowel syndrome)    Morbid obesity with BMI of 40.0-44.9, adult (HCC)    OSA (obstructive sleep apnea)    Other fatigue    PONV (postoperative nausea and vomiting)    states also had localized reaction to IV meds during/following endoscopy 2011 requiring IV Benadryl - ? location of test   Swallowing difficulty    Vitamin D  deficiency     BP 126/80 (BP Location: Left Arm, Patient Position: Sitting)   Pulse 93   Temp 97.7 F (36.5 C) (Oral)   Resp 16   Ht 5' 7 (1.702 m)   Wt 211 lb (95.7 kg)   SpO2 96%   BMI 33.05 kg/m  Gen: awake, alert, appearing stated age Lungs: No accessory muscle use Mouth: MMM, ulceration with rim of erythema noted on L max gum line at base of canine and premolars Skin: mild ttp over L submand gland without obvious deformity or edema. No fluctuance, drainage, color change Psych: Age appropriate judgment and insight  Aphthous ulcer of mouth - Plan: valACYclovir (VALTREX) 1000 MG tablet  Submandibular gland inflammation  Cont topical anesthetics OTC. Valtrex prn.  2/2 #1. Tylenol  prn. Don't touch it.  F/u prn. The patient voiced understanding and agreement to the plan.  Shellie Dials [place], DO 01/25/24 8:05 AM

## 2024-02-04 ENCOUNTER — Other Ambulatory Visit (HOSPITAL_BASED_OUTPATIENT_CLINIC_OR_DEPARTMENT_OTHER): Payer: Self-pay

## 2024-02-25 ENCOUNTER — Other Ambulatory Visit (HOSPITAL_BASED_OUTPATIENT_CLINIC_OR_DEPARTMENT_OTHER): Payer: Self-pay

## 2024-02-25 ENCOUNTER — Ambulatory Visit: Admitting: Family

## 2024-02-25 VITALS — BP 120/73 | HR 79 | Resp 16 | Ht 67.0 in | Wt 213.8 lb

## 2024-02-25 DIAGNOSIS — Z6833 Body mass index (BMI) 33.0-33.9, adult: Secondary | ICD-10-CM | POA: Diagnosis not present

## 2024-02-25 DIAGNOSIS — F319 Bipolar disorder, unspecified: Secondary | ICD-10-CM

## 2024-02-25 DIAGNOSIS — E66811 Obesity, class 1: Secondary | ICD-10-CM

## 2024-02-25 DIAGNOSIS — F32A Depression, unspecified: Secondary | ICD-10-CM

## 2024-02-25 DIAGNOSIS — F419 Anxiety disorder, unspecified: Secondary | ICD-10-CM

## 2024-02-25 MED ORDER — SERTRALINE HCL 50 MG PO TABS
ORAL_TABLET | ORAL | 1 refills | Status: DC
  Filled 2024-02-25: qty 30, 30d supply, fill #0

## 2024-02-25 NOTE — Patient Instructions (Addendum)
 Psychiatric Services:  Dr. Emilio [facility]) - [phone] [facility] [place]) [phone] [facility] [place]) [phone] [facility] [place]) [phone]  VISIT SUMMARY:  During your visit, we discussed your increased irritability and agitation, knee pain, blood pressure management, and weight management. We made some changes to your medication and recommended further evaluations to better manage your symptoms.  YOUR PLAN:  DEPRESSION WITH IRRITABILITY: You have been experiencing increased irritability and agitation, which may be linked to your current medication regimen. -We recommend a psychiatric evaluation for better medication management. -We will switch your Lexapro  to Zoloft  (sertraline ) to help control your anxiety. -Increase your Buspar  dosage to 10 mg twice daily.  KNEE PAIN: You have knee pain and are scheduled for gel injections, which have been effective for you in the past. -Proceed with the scheduled gel injections for your knee pain.  HYPERTENSION: Your blood pressure is well-controlled with hydrochlorothiazide . -Continue taking hydrochlorothiazide  to manage your blood pressure.  WEIGHT MANAGEMENT: You are interested in weight loss medications and have had success with Mounjaro  in the past. We discussed the challenges and costs associated with these medications. -Monitor your A1c levels to keep track of your prediabetic status. -Consider the potential for compounded GLP-1 agonists or direct from manufacturer cash pay if the cost becomes feasible.

## 2024-02-25 NOTE — Progress Notes (Signed)
 Subjective:     Patient ID: Lori Montgomery, female    DOB: Dec 22, 1970, 53 y.o.   MRN: [id number]  Chief Complaint  Patient presents with   Depression    Says she has been more irritable than usual      HPI  Discussed the use of AI scribe software for clinical note transcription with the patient, who gave verbal consent to proceed.  History of Present Illness  Lori Montgomery is a 53 year old female who presents with increased irritability and agitation.  She experiences increased irritability and agitation despite her current medication regimen, which includes Wellbutrin  300 mg, Buspar  10 mg once daily, and Lexapro  20 mg. She is not under psychiatric care and experiences anxiety episodes but does not feel depressed. Her sleep quality has improved after training her dogs to sleep in their crates, eliminating previous disruptions. She has knee pain and is scheduled for gel shots, a treatment that has been effective before. She struggles with weight management, having found Mounjaro  effective in the past, while Wegovy  was less so. She is on hydrochlorothiazide  for blood pressure and is prediabetic, with a history of gastric bypass and lap band surgery.     Health Maintenance Due  Topic Date Due   HIV Screening  Never done   Hepatitis C Screening  Never done   Pneumococcal Vaccine 74-55 Years old (1 of 2 - PCV) Never done   Hepatitis B Vaccines (1 of 3 - 19+ 3-dose series) Never done   Cervical Cancer Screening (HPV/Pap Cotest)  12/04/2017   COVID-19 Vaccine (1 - 2024-25 season) Never done   Zoster Vaccines- Shingrix  (2 of 2) 11/26/2023    Past Medical History:  Diagnosis Date   Anxiety    B12 deficiency    Bilateral knee pain    Constipation    Depression    Depression    GERD (gastroesophageal reflux disease)    Granuloma annulare    Hypertension    IBS (irritable bowel syndrome)    Morbid obesity with BMI of 40.0-44.9, adult (HCC)    OSA (obstructive sleep apnea)     Other fatigue    PONV (postoperative nausea and vomiting)    states also had localized reaction to IV meds during/following endoscopy 2011 requiring IV Benadryl - ? location of test   Swallowing difficulty    Vitamin D  deficiency     Past Surgical History:  Procedure Laterality Date   ABDOMINAL HYSTERECTOMY N/A 07/11/2015   Procedure: HYSTERECTOMY ABDOMINAL;  Surgeon: Truman Corona, MD;  Location: [facility];  Service: Gynecology;  Laterality: N/A;   BILATERAL SALPINGECTOMY  07/11/2015   Procedure: BILATERAL SALPINGECTOMY;  Surgeon: Truman Corona, MD;  Location: [facility];  Service: Gynecology;;   BREAST REDUCTION SURGERY  08/10/2006   with scar revision   BREAST SURGERY     CYST REMOVAL HAND     DIAGNOSTIC LAPAROSCOPY     DILATION AND CURETTAGE OF UTERUS     x 3 for miscarriages   GASTRIC ROUX-EN-Y N/A 02/27/2014   Procedure:  LAPAROSCOPIC REMOVAL OF LAP BAND AND PORT,LAPAROSCOPIC ROUX-EN-Y GASTRIC BYPASS WITH UPPER ENDOSCOPY, ;  Surgeon: Camellia CHRISTELLA Blush, MD;  Location: [facility];  Service: General;  Laterality: N/A;   KNEE ARTHROSCOPY     right knee/ with ACL REPAIR   LAPAROSCOPIC CHOLECYSTECTOMY  03/10/2010   Dr Sheldon   LAPAROSCOPIC GASTRIC BANDING N/A 10/04/2012   Procedure: LAPAROSCOPIC GASTRIC BANDING;  Surgeon: Camellia CHRISTELLA Blush, MD,FACS;  Location: [facility];  Service:  General;  Laterality: N/A;  Laparoscopic Adjustable Gastric Banding,    MESH APPLIED TO LAP PORT N/A 10/04/2012   Procedure: MESH APPLIED TO LAP PORT;  Surgeon: Camellia CHRISTELLA Blush, MD,FACS;  Location: [facility];  Service: General;  Laterality: N/A;    Family History  Problem Relation Age of Onset   Obesity Mother    Eating disorder Mother    Depression Mother    Stroke Mother    Hyperlipidemia Mother    Hypertension Mother    Diabetes Mother        stroke, hypertension   Heart failure Mother    Sleep apnea Father    Depression Father    Kidney disease Father    Hyperlipidemia Father    Hypertension Father    Diabetes  Father        minor heart attack   Lung cancer Maternal Grandmother    Heart attack Maternal Grandfather    Heart disease Paternal Grandmother        MI   Heart disease Paternal Grandfather        MI   Breast cancer Maternal Aunt    Kidney disease Other    Depression Other    Sleep apnea Other    Eating disorder Other    Obesity Other     Social History   Socioeconomic History   Marital status: Married    Spouse name: Deward   Number of children: 4   Years of education: Not on file   Highest education level: Not on file  Occupational History   Occupation: Investment banker, corporate: [employer]  Tobacco Use   Smoking status: Former    Current packs/day: 0.00    Average packs/day: 0.5 packs/day for 33.0 years (17.1 ttl pk-yrs)    Types: Cigarettes    Start date: 09/10/1987    Quit date: 08/10/2019    Years since quitting: 4.5   Smokeless tobacco: Never   Tobacco comments:    1 pack/week (05/10/14)  Vaping Use   Vaping status: Never Used  Substance and Sexual Activity   Alcohol use: Yes    Comment: rarely   Drug use: No   Sexual activity: Yes    Partners: Male  Other Topics Concern   Not on file  Social History Narrative   One son- 2001  works for [employer], lives on his own   On daughter- 2006 senior in [employer]   Married   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)   Completed some college   Enjoys crafts   3 dogs   Social Drivers of Corporate investment banker Strain: Not on file  Food Insecurity: Not on file  Transportation Needs: Not on file  Physical Activity: Not on file  Stress: Not on file  Social Connections: Unknown (12/15/2021)   Received from [facility]   Social Network    Social Network: Not on file  Intimate Partner Violence: Unknown (11/10/2021)   Received from [facility]   HITS    Physically Hurt: Not on file    Insult or Talk Down To: Not on file    Threaten Physical Harm: Not on file    Scream or Curse: Not on file     Outpatient Medications Prior to Visit  Medication Sig Dispense Refill   buPROPion  (WELLBUTRIN  XL) 300 MG 24 hr tablet Take 1 tablet (300 mg total) by mouth daily. 90 tablet 1   busPIRone  (BUSPAR ) 10 MG tablet  Take 1 tablet (10 mg total) by mouth 2 (two) times daily. (Patient taking differently: Take 10 mg by mouth 1 day or 1 dose.) 180 tablet 1   diazepam  (VALIUM ) 10 MG tablet Take 1 tablet (10 mg total) by mouth as needed for anxiety. 30 tablet 0   finasteride (PROSCAR) 5 MG tablet Take 2.5 mg by mouth daily.     hydrochlorothiazide  (HYDRODIURIL ) 12.5 MG tablet Take 1 tablet (12.5 mg total) by mouth daily. 90 tablet 1   rosuvastatin  (CRESTOR ) 10 MG tablet Take 1 tablet (10 mg total) by mouth daily. 90 tablet 3   spironolactone (ALDACTONE) 100 MG tablet Take 100 mg by mouth daily.     valACYclovir  (VALTREX ) 1000 MG tablet Take 2 tablets (2,000 mg total) by mouth and repeat in 12 hours in event of an outbreak. 30 tablet 1   escitalopram  (LEXAPRO ) 20 MG tablet Take 1 tablet (20 mg total) by mouth daily. 90 tablet 1   Semaglutide , 1 MG/DOSE, (OZEMPIC , 1 MG/DOSE,) 4 MG/3ML SOPN Using compounded per online provider (Patient not taking: Reported on 02/25/2024)     No facility-administered medications prior to visit.    Allergies  Allergen Reactions   Morphine  Itching    Other reaction(s): Other (See Comments)    ROS See HPI    Objective:    Physical Exam Constitutional:      General: She is not in acute distress.    Appearance: Normal appearance. She is well-developed.  HENT:     Head: Normocephalic and atraumatic.     Right Ear: External ear normal.     Left Ear: External ear normal.  Eyes:     General: No scleral icterus. Neck:     Thyroid : No thyromegaly.  Cardiovascular:     Rate and Rhythm: Normal rate and regular rhythm.     Heart sounds: Normal heart sounds. No murmur heard. Pulmonary:     Effort: Pulmonary effort is normal. No respiratory distress.     Breath  sounds: Normal breath sounds. No wheezing.  Musculoskeletal:     Cervical back: Neck supple.  Skin:    General: Skin is warm and dry.  Neurological:     Mental Status: She is alert and oriented to person, place, and time.  Psychiatric:        Mood and Affect: Mood normal.        Behavior: Behavior normal.        Thought Content: Thought content normal.        Judgment: Judgment normal.      BP 120/73   Pulse 79   Resp 16   Ht 5' 7 (1.702 m)   Wt 213 lb 12.8 oz (97 kg)   LMP 07/02/2015 (Approximate)   SpO2 100%   BMI 33.49 kg/m  Wt Readings from Last 3 Encounters:  02/25/24 213 lb 12.8 oz (97 kg)  01/25/24 211 lb (95.7 kg)  12/29/23 212 lb 6.4 oz (96.3 kg)       Assessment & Plan:   Problem List Items Addressed This Visit       Unprioritized   Obesity (BMI 30.0-34.9)    Interested in weight loss medications. Advised against phentermine due hypertension history  and lack of long-term efficacy. Discussed high cost and insurance challenges of GLP-1 agonists. Wegovy  is 80% as effective as Mounjaro . Prediabetic status noted, suggesting A1c monitoring. - Monitor A1c levels. - Discuss potential for compounded GLP-1 agonists if cost becomes feasible or direct rx from manufacturer.  She will look into various options.       Bipolar depression (HCC)    Increased irritability and agitation. Does not appear actively manic. Psychiatric evaluation recommended for medication management.  Advised pt to schedule appointment with psychiatry.  In the meantime, since it will likely be a long wait for psychiatry, will transition from Lexapro  to Zoloft  for anxiety control. Advised pt to decrease lexapro  from 20mg  daily to 10mg  once daily for 1 week while stopping. In the meantime, will intiate zoloft  50mg , 1/2 tab once daily for 1 week then increase to a full tab on week two. Increase Buspar  to bid (had only been taking once a day)  dosage for efficacy. Specialist involvement advised due to  suboptimal outcomes on current multidrug regimen. May benefit from mood stabilizer- will defer to psychiatry. She is advised to reach out if she has any issues with this transition.       Anxiety and depression - Primary   Relevant Medications   sertraline  (ZOLOFT ) 50 MG tablet    I have discontinued Marquerite Breeze's escitalopram . I am also having her start on sertraline . Additionally, I am having her maintain her finasteride, rosuvastatin , spironolactone, Ozempic  (1 MG/DOSE), diazepam , busPIRone , hydrochlorothiazide , buPROPion , and valACYclovir .  Meds ordered this encounter  Medications   sertraline  (ZOLOFT ) 50 MG tablet    Sig: Take 1/2 tablet by mouth once daily for 1 week, then increase as directed.    Dispense:  30 tablet    Refill:  1    Supervising Provider:   DOMENICA BLACKBIRD A [[id number]]

## 2024-02-26 NOTE — Assessment & Plan Note (Signed)
  Interested in weight loss medications. Advised against phentermine due hypertension history  and lack of long-term efficacy. Discussed high cost and insurance challenges of GLP-1 agonists. Wegovy  is 80% as effective as Mounjaro . Prediabetic status noted, suggesting A1c monitoring. - Monitor A1c levels. - Discuss potential for compounded GLP-1 agonists if cost becomes feasible or direct rx from manufacturer. She will look into various options.

## 2024-02-26 NOTE — Assessment & Plan Note (Addendum)
  Increased irritability and agitation. Does not appear actively manic. Psychiatric evaluation recommended for medication management.  Advised pt to schedule appointment with psychiatry.  In the meantime, since it will likely be a long wait for psychiatry, will transition from Lexapro  to Zoloft  for anxiety control. Advised pt to decrease lexapro  from 20mg  daily to 10mg  once daily for 1 week while stopping. In the meantime, will intiate zoloft  50mg , 1/2 tab once daily for 1 week then increase to a full tab on week two. Increase Buspar  to bid (had only been taking once a day)  dosage for efficacy. Specialist involvement advised due to suboptimal outcomes on current multidrug regimen. May benefit from mood stabilizer- will defer to psychiatry. She is advised to reach out if she has any issues with this transition.

## 2024-03-02 ENCOUNTER — Encounter: Payer: Self-pay | Admitting: Family

## 2024-03-16 ENCOUNTER — Encounter: Payer: Self-pay | Admitting: Family

## 2024-03-16 DIAGNOSIS — J342 Deviated nasal septum: Secondary | ICD-10-CM

## 2024-03-17 ENCOUNTER — Telehealth: Admitting: Adult Health

## 2024-03-17 ENCOUNTER — Encounter: Payer: Self-pay | Admitting: Adult Health

## 2024-03-17 VITALS — Ht 67.0 in | Wt 210.0 lb

## 2024-03-17 DIAGNOSIS — F411 Generalized anxiety disorder: Secondary | ICD-10-CM | POA: Diagnosis not present

## 2024-03-17 DIAGNOSIS — F331 Major depressive disorder, recurrent, moderate: Secondary | ICD-10-CM | POA: Diagnosis not present

## 2024-03-17 NOTE — Progress Notes (Signed)
 Virtual Visit via Video Note  I connected with pt @ on 03/17/24 at 3:00 PM EDT by a video enabled telemedicine application and verified that I am speaking with the correct person using two identifiers.   I discussed the limitations of evaluation and management by telemedicine and the availability of in person appointments. The patient expressed understanding and agreed to proceed.  I discussed the assessment and treatment plan with the patient. The patient was provided an opportunity to ask questions and all were answered. The patient agreed with the plan and demonstrated an understanding of the instructions.   The patient was advised to call back or seek an in-person evaluation if the symptoms worsen or if the condition fails to improve as anticipated.  I provided 60 minutes of non-face-to-face time during this encounter.  The patient was located at home.  The provider was located at [facility].   Angeline LOISE Sayers, NP    [facility] MD/PA/NP Initial Note  03/17/2024 3:54 PM Lori Montgomery  MRN:  [id number]  Chief Complaint:   HPI:   Patient seen today for initial psychiatric evaluation.   Referred by PCP - reports working with PCP for psychiatric medication management and was referred to follow up with a psychiatric provider. Reports she recently stopped Lexapro  as it was no longer managing mood symptoms. She was then started on Zoloft  50mg  daily. She is also taking Wellbutrin  XL 300mg  daily and Buspar  10mg  twice daily. She does note some improvement with recent changes. Describes mood today as better. Pleasant. Mood symptoms - reports anxiety. Reports some irritability. Denies depression. Reports stable interest and motivation. Denies panic attacks - 2 to 3 in the past 2 years. Denies worry. Reports rumination and over thinking. Reports intrusive thoughts from childhood - things I wouldn't normally think of. Denies obsessive thoughts or acts. Reports mood has been more  stable with recent medication changes. Taking medications as prescribed.  Energy levels lower. Active, does not have a regular exercise routine. Works full-time  Enjoys some usual interests and activities. Married. Lives with husband and 2 children - 49 and 32. Spending time with family. Appetite adequate. Weight gain - appt with healthy weight and wellness. Sleeps better some nights than others. Averages 8 hours of broken sleep. Reports difficulties with focus and concentration stable. Reports difficulties organization. Completing tasks. Managing aspects of household. Suspects ADD, never tested. Works full time Financial controller - traveling. Denies SI or HI.  Denies AH or VH. Denies self harm.  Denies substance use.  Previous medication trials:   Wellbutrin   Buspar  Lexapro  Prozac Celexa  Visit Diagnosis:    ICD-10-CM   1. Major depressive disorder, recurrent episode, moderate (HCC)  F33.1     2. Generalized anxiety disorder  F41.1       Past Psychiatric History: Denies  Past Medical History:  Past Medical History:  Diagnosis Date   Anxiety    B12 deficiency    Bilateral knee pain    Constipation    Depression    Depression    GERD (gastroesophageal reflux disease)    Granuloma annulare    Hypertension    IBS (irritable bowel syndrome)    Morbid obesity with BMI of 40.0-44.9, adult (HCC)    OSA (obstructive sleep apnea)    Other fatigue    PONV (postoperative nausea and vomiting)    states also had localized reaction to IV meds during/following endoscopy 2011 requiring IV Benadryl - ? location of test   Swallowing difficulty  Vitamin D  deficiency     Past Surgical History:  Procedure Laterality Date   ABDOMINAL HYSTERECTOMY N/A 07/11/2015   Procedure: HYSTERECTOMY ABDOMINAL;  Surgeon: Truman Corona, MD;  Location: [facility];  Service: Gynecology;  Laterality: N/A;   BILATERAL SALPINGECTOMY  07/11/2015   Procedure: BILATERAL SALPINGECTOMY;  Surgeon: Truman Corona, MD;   Location: [facility];  Service: Gynecology;;   BREAST REDUCTION SURGERY  08/10/2006   with scar revision   BREAST SURGERY     CYST REMOVAL HAND     DIAGNOSTIC LAPAROSCOPY     DILATION AND CURETTAGE OF UTERUS     x 3 for miscarriages   GASTRIC ROUX-EN-Y N/A 02/27/2014   Procedure:  LAPAROSCOPIC REMOVAL OF LAP BAND AND PORT,LAPAROSCOPIC ROUX-EN-Y GASTRIC BYPASS WITH UPPER ENDOSCOPY, ;  Surgeon: Camellia CHRISTELLA Blush, MD;  Location: [facility];  Service: General;  Laterality: N/A;   KNEE ARTHROSCOPY     right knee/ with ACL REPAIR   LAPAROSCOPIC CHOLECYSTECTOMY  03/10/2010   Dr Sheldon   LAPAROSCOPIC GASTRIC BANDING N/A 10/04/2012   Procedure: LAPAROSCOPIC GASTRIC BANDING;  Surgeon: Camellia CHRISTELLA Blush, MD,FACS;  Location: [facility];  Service: General;  Laterality: N/A;  Laparoscopic Adjustable Gastric Banding,    MESH APPLIED TO LAP PORT N/A 10/04/2012   Procedure: MESH APPLIED TO LAP PORT;  Surgeon: Camellia CHRISTELLA Blush, MD,FACS;  Location: [facility];  Service: General;  Laterality: N/A;    Family Psychiatric History: Reports family history of mental illness - depression.   Family History:  Family History  Problem Relation Age of Onset   Obesity Mother    Eating disorder Mother    Depression Mother    Stroke Mother    Hyperlipidemia Mother    Hypertension Mother    Diabetes Mother        stroke, hypertension   Heart failure Mother    Sleep apnea Father    Depression Father    Kidney disease Father    Hyperlipidemia Father    Hypertension Father    Diabetes Father        minor heart attack   Lung cancer Maternal Grandmother    Heart attack Maternal Grandfather    Heart disease Paternal Grandmother        MI   Heart disease Paternal Grandfather        MI   Breast cancer Maternal Aunt    Kidney disease Other    Depression Other    Sleep apnea Other    Eating disorder Other    Obesity Other     Social History:  Social History   Socioeconomic History   Marital status: Married    Spouse name: Deward    Number of children: 4   Years of education: Not on file   Highest education level: Not on file  Occupational History   Occupation: Investment banker, corporate: [employer]  Tobacco Use   Smoking status: Former    Current packs/day: 0.00    Average packs/day: 0.5 packs/day for 33.0 years (17.1 ttl pk-yrs)    Types: Cigarettes    Start date: 09/10/1987    Quit date: 08/10/2019    Years since quitting: 4.6   Smokeless tobacco: Never   Tobacco comments:    1 pack/week (05/10/14)  Vaping Use   Vaping status: Never Used  Substance and Sexual Activity   Alcohol use: Yes    Comment: rarely   Drug use: No   Sexual activity: Yes    Partners:  Male  Other Topics Concern   Not on file  Social History Narrative   One son- 2001  works for [employer], lives on his own   On daughter- 2006 senior in [employer]   Married   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)   Completed some college   Enjoys crafts   3 dogs   Social Drivers of Corporate investment banker Strain: Not on [employer] Insecurity: Not on file  Transportation Needs: Not on file  Physical Activity: Not on file  Stress: Not on file  Social Connections: Unknown (12/15/2021)   Received from [facility]   Social Network    Social Network: Not on file    Allergies:  Allergies  Allergen Reactions   Morphine  Itching    Other reaction(s): Other (See Comments)    Metabolic Disorder Labs: Lab Results  Component Value Date   HGBA1C 5.5 10/01/2023   MPG 111 10/01/2023   No results found for: PROLACTIN Lab Results  Component Value Date   CHOL 177 10/01/2023   TRIG 75 10/01/2023   HDL 57 10/01/2023   CHOLHDL 3.1 10/01/2023   VLDL 24 11/01/2013   LDLCALC 104 (H) 10/01/2023   LDLCALC 111 (H) 01/29/2022   Lab Results  Component Value Date   TSH 2.05 10/01/2023   TSH 4.240 04/17/2021    Therapeutic Level Labs: No results found for: LITHIUM No results found for: VALPROATE No results found for:  CBMZ  Current Medications: Current Outpatient Medications  Medication Sig Dispense Refill   buPROPion  (WELLBUTRIN  XL) 300 MG 24 hr tablet Take 1 tablet (300 mg total) by mouth daily. 90 tablet 1   busPIRone  (BUSPAR ) 10 MG tablet Take 1 tablet (10 mg total) by mouth 2 (two) times daily. (Patient taking differently: Take 10 mg by mouth 1 day or 1 dose.) 180 tablet 1   diazepam  (VALIUM ) 10 MG tablet Take 1 tablet (10 mg total) by mouth as needed for anxiety. 30 tablet 0   finasteride (PROSCAR) 5 MG tablet Take 2.5 mg by mouth daily.     hydrochlorothiazide  (HYDRODIURIL ) 12.5 MG tablet Take 1 tablet (12.5 mg total) by mouth daily. 90 tablet 1   rosuvastatin  (CRESTOR ) 10 MG tablet Take 1 tablet (10 mg total) by mouth daily. 90 tablet 3   Semaglutide , 1 MG/DOSE, (OZEMPIC , 1 MG/DOSE,) 4 MG/3ML SOPN Using compounded per online provider (Patient not taking: Reported on 02/25/2024)     sertraline  (ZOLOFT ) 50 MG tablet Take 1/2 tablet by mouth once daily for 1 week, then increase as directed. 30 tablet 1   spironolactone (ALDACTONE) 100 MG tablet Take 100 mg by mouth daily.     valACYclovir  (VALTREX ) 1000 MG tablet Take 2 tablets (2,000 mg total) by mouth and repeat in 12 hours in event of an outbreak. 30 tablet 1   No current facility-administered medications for this visit.    Medication Side Effects: none  Orders placed this visit:  No orders of the defined types were placed in this encounter.   Psychiatric Specialty Exam:  Review of Systems  Musculoskeletal:  Negative for gait problem.  Neurological:  Negative for tremors.  Psychiatric/Behavioral:         Please refer to HPI    Last menstrual period 07/02/2015.There is no height or weight on file to calculate BMI.  General Appearance: Casual and Neat  Eye Contact:  Good  Speech:  Clear and Coherent and Normal Rate  Volume:  Normal  Mood:  Euthymic  Affect:  Appropriate and Congruent  Thought Process:  Coherent and Descriptions of  Associations: Intact  Orientation:  Full (Time, Place, and Person)  Thought Content: Logical   Suicidal Thoughts:  No  Homicidal Thoughts:  No  Memory:  WNL  Judgement:  Good  Insight:  Good  Psychomotor Activity:  Normal  Concentration:  Concentration: Good and Attention Span: Good  Recall:  Good  Fund of Knowledge: Good  Language: Good  Assets:  Communication Skills Desire for Improvement Financial Resources/Insurance Housing Intimacy Leisure Time Physical Health Resilience Social Support Talents/Skills Transportation Vocational/Educational  ADL's:  Intact  Cognition: WNL  Prognosis:  Good   Screenings:  GAD-7    Flowsheet Row Office Visit from 02/25/2024 in [facility]  Total GAD-7 Score 5   PHQ2-9    Flowsheet Row Office Visit from 02/25/2024 in [facility] Office Visit from 04/17/2021 in [facility] Total Score 2 6  PHQ-9 Total Score 7 21    Receiving Psychotherapy: No   Treatment Plan/Recommendations:   Plan:  PDMP reviewed  Wellbutrin  XL 300mg  daily Buspar  10mg  BID Zoloft  50mg  daily    60 minutes spent dedicated to the care of this patient on the date of this encounter to include pre-visit review of records, ordering of medication, post visit documentation, and face-to-face time with the patient discussing depression and anxiety. Discussed continuing current medication regimen - Zoloft  50mg  started less than a month ago.  RTC 4 weeks  Patient advised to contact office with any questions, adverse effects, or acute worsening in signs and symptoms.      Skyler Dusing N Janalynn Eder, NP

## 2024-03-24 ENCOUNTER — Ambulatory Visit: Admitting: Family

## 2024-03-26 ENCOUNTER — Encounter: Payer: Self-pay | Admitting: Adult Health

## 2024-03-26 DIAGNOSIS — F32A Depression, unspecified: Secondary | ICD-10-CM

## 2024-03-28 ENCOUNTER — Other Ambulatory Visit (HOSPITAL_BASED_OUTPATIENT_CLINIC_OR_DEPARTMENT_OTHER): Payer: Self-pay

## 2024-03-28 ENCOUNTER — Ambulatory Visit: Admitting: Family

## 2024-03-31 ENCOUNTER — Encounter: Payer: Self-pay | Admitting: Family

## 2024-03-31 ENCOUNTER — Telehealth: Payer: Self-pay | Admitting: Family

## 2024-03-31 NOTE — Telephone Encounter (Signed)
 Copied from CRM #[id number]. Topic: Appointments - Scheduling Inquiry for Clinic >> Mar 30, 2024  4:51 PM Jasmin G wrote: Reason for CRM: Pt called regarding recent appt labeled as a No Show on 8/15, she states that she called ahead of time around Monday or Tuesday of that same week to cancel and that's why she's is confused as to why it was labeled as a No Show, her main concern is being charged for appt. Please call her back at [phone] for clarification.

## 2024-04-03 ENCOUNTER — Other Ambulatory Visit: Payer: Self-pay

## 2024-04-03 MED ORDER — BUSPIRONE HCL 10 MG PO TABS: 10.0000 mg | ORAL_TABLET | Freq: Two times a day (BID) | ORAL | 1 refills | Status: AC

## 2024-04-12 ENCOUNTER — Ambulatory Visit: Payer: Self-pay | Admitting: Family

## 2024-04-14 ENCOUNTER — Ambulatory Visit (INDEPENDENT_AMBULATORY_CARE_PROVIDER_SITE_OTHER): Admitting: Family

## 2024-04-14 VITALS — BP 122/82 | HR 78 | Temp 98.4°F | Resp 16 | Ht 67.0 in | Wt 209.0 lb

## 2024-04-14 DIAGNOSIS — E66811 Obesity, class 1: Secondary | ICD-10-CM

## 2024-04-14 DIAGNOSIS — I10 Essential (primary) hypertension: Secondary | ICD-10-CM

## 2024-04-14 DIAGNOSIS — E559 Vitamin D deficiency, unspecified: Secondary | ICD-10-CM | POA: Diagnosis not present

## 2024-04-14 DIAGNOSIS — R7303 Prediabetes: Secondary | ICD-10-CM | POA: Diagnosis not present

## 2024-04-14 NOTE — Progress Notes (Signed)
 Subjective:     Patient ID: Lori Montgomery, female    DOB: [birth year], 53 y.o.   MRN: [id number]  Chief Complaint  Patient presents with   Insomnia    Here for follow up last [facility] Feb 2025   Anxiety    Here for follow up    HPI  Discussed the use of AI scribe software for clinical note transcription with the patient, who gave verbal consent to proceed.  History of Present Illness  Lori Montgomery is a 53 year old female who presents for follow-up on her mental health management and weight loss progress. She transitioned from Lexapro  to sertraline , starting at 50 mg and increasing to 100 mg, which improved her mood stability. She experienced mild anxiety at a grocery store, managed with self-talk. Her current medications include sertraline  100 mg, Wellbutrin  300 mg, and Buspar  10 mg. She is engaged in a weight loss program and initially did not see results with semaglutide . After starting tirzepatide , she lost five pounds in the first week and noted significant health improvements, including reduced blood pressure. She pays $499 fotirzepatide  and monitors her blood pressure at home, which remains stable. She underwent a partial hysterectomy with removal of the uterus but retention of the ovaries. She is current with annual exams, including mammograms and MRIs, due to a family history of breast cancer.      Health Maintenance Due  Topic Date Due   HIV Screening  Never done   Hepatitis C Screening  Never done   Pneumococcal Vaccine: 50+ Years (1 of 2 - PCV) Never done   Hepatitis B Vaccines 19-59 Average Risk (1 of 3 - 19+ 3-dose series) Never done   Zoster Vaccines- Shingrix  (2 of 2) 11/26/2023   Influenza Vaccine  03/10/2024   COVID-19 Vaccine (1 - 2024-25 season) Never done    Past Medical History:  Diagnosis Date   Anxiety    B12 deficiency    Bilateral knee pain    Constipation    Depression    Depression    GERD (gastroesophageal reflux disease)    Granuloma annulare     Hypertension    IBS (irritable bowel syndrome)    Morbid obesity with BMI of 40.0-44.9, adult (HCC)    OSA (obstructive sleep apnea)    Other fatigue    PONV (postoperative nausea and vomiting)    states also had localized reaction to IV meds during/following endoscopy 2011 requiring IV Benadryl - ? location of test   Swallowing difficulty    Vitamin D  deficiency     Past Surgical History:  Procedure Laterality Date   ABDOMINAL HYSTERECTOMY N/A 07/11/2015   Procedure: HYSTERECTOMY ABDOMINAL;  Surgeon: Truman Corona, MD;  Location: [facility];  Service: Gynecology;  Laterality: N/A;   BILATERAL SALPINGECTOMY  07/11/2015   Procedure: BILATERAL SALPINGECTOMY;  Surgeon: Truman Corona, MD;  Location: [facility];  Service: Gynecology;;   BREAST REDUCTION SURGERY  08/10/2006   with scar revision   BREAST SURGERY     CYST REMOVAL HAND     DIAGNOSTIC LAPAROSCOPY     DILATION AND CURETTAGE OF UTERUS     x 3 for miscarriages   GASTRIC ROUX-EN-Y N/A 02/27/2014   Procedure:  LAPAROSCOPIC REMOVAL OF LAP BAND AND PORT,LAPAROSCOPIC ROUX-EN-Y GASTRIC BYPASS WITH UPPER ENDOSCOPY, ;  Surgeon: Camellia CHRISTELLA Blush, MD;  Location: [facility];  Service: General;  Laterality: N/A;   KNEE ARTHROSCOPY     right knee/ with ACL REPAIR   LAPAROSCOPIC CHOLECYSTECTOMY  03/10/2010   Dr Sheldon   LAPAROSCOPIC GASTRIC BANDING N/A 10/04/2012   Procedure: LAPAROSCOPIC GASTRIC BANDING;  Surgeon: Camellia CHRISTELLA Blush, MD,FACS;  Location: [facility];  Service: General;  Laterality: N/A;  Laparoscopic Adjustable Gastric Banding,    MESH APPLIED TO LAP PORT N/A 10/04/2012   Procedure: MESH APPLIED TO LAP PORT;  Surgeon: Camellia CHRISTELLA Blush, MD,FACS;  Location: [facility];  Service: General;  Laterality: N/A;    Family History  Problem Relation Age of Onset   Obesity Mother    Eating disorder Mother    Depression Mother    Stroke Mother    Hyperlipidemia Mother    Hypertension Mother    Diabetes Mother        stroke, hypertension   Heart failure  Mother    Sleep apnea Father    Depression Father    Kidney disease Father    Hyperlipidemia Father    Hypertension Father    Diabetes Father        minor heart attack   Lung cancer Maternal Grandmother    Heart attack Maternal Grandfather    Heart disease Paternal Grandmother        MI   Heart disease Paternal Grandfather        MI   Breast cancer Maternal Aunt    Kidney disease Other    Depression Other    Sleep apnea Other    Eating disorder Other    Obesity Other     Social History   Socioeconomic History   Marital status: Married    Spouse name: Deward   Number of children: 4   Years of education: Not on file   Highest education level: Some college, no degree  Occupational History   Occupation: Investment banker, corporate: [employer]  Tobacco Use   Smoking status: Former    Current packs/day: 0.00    Average packs/day: 0.5 packs/day for 33.0 years (17.1 ttl pk-yrs)    Types: Cigarettes    Start date: 09/10/1987    Quit date: 08/10/2019    Years since quitting: 4.6   Smokeless tobacco: Never   Tobacco comments:    1 pack/week (05/10/14)  Vaping Use   Vaping status: Never Used  Substance and Sexual Activity   Alcohol use: Yes    Comment: rarely   Drug use: No   Sexual activity: Yes    Partners: Male  Other Topics Concern   Not on file  Social History Narrative   One son- 2001  works for [employer], lives on his own   On daughter- 2006 senior in [employer]   Married   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)   Completed some college   Enjoys crafts   3 dogs   Social Drivers of Corporate investment banker Strain: Low Risk  (04/14/2024)   Overall Financial Resource Strain (CARDIA)    Difficulty of Paying Living Expenses: Not hard at all  Food Insecurity: No Food Insecurity (04/14/2024)   Hunger Vital Sign    Worried About Running Out of Food in the Last Year: Never true    Ran Out of Food in the Last Year: Never true  Transportation Needs: No  Transportation Needs (04/14/2024)   PRAPARE - Administrator, Civil Service (Medical): No    Lack of Transportation (Non-Medical): No  Physical Activity: Insufficiently Active (04/14/2024)   Exercise Vital Sign    Days of Exercise per Week: 1 day  Minutes of Exercise per Session: 10 min  Stress: No Stress Concern Present (04/14/2024)   [employer] of Occupational Health - Occupational Stress Questionnaire    Feeling of Stress: Only a little  Social Connections: Moderately Integrated (04/14/2024)   Social Connection and Isolation Panel    Frequency of Communication with Friends and Family: More than three times a week    Frequency of Social Gatherings with Friends and Family: Twice a week    Attends Religious Services: 1 to 4 times per year    Active Member of [employer] or Organizations: No    Attends Engineer, structural: Not on file    Marital Status: Married  Intimate Partner Violence: Unknown (11/10/2021)   Received from [facility]   HITS    Physically Hurt: Not on file    Insult or Talk Down To: Not on file    Threaten Physical Harm: Not on file    Scream or Curse: Not on file    Outpatient Medications Prior to Visit  Medication Sig Dispense Refill   phentermine 37.5 MG capsule Take 37.5 mg by mouth every morning.     tirzepatide  (ZEPBOUND ) 15 MG/0.5ML Pen Inject 15 mg into the skin once a week. Taking 7.5     sertraline  (ZOLOFT ) 100 MG tablet Take 100 mg by mouth daily.     buPROPion  (WELLBUTRIN  XL) 300 MG 24 hr tablet Take 1 tablet (300 mg total) by mouth daily. 90 tablet 1   busPIRone  (BUSPAR ) 10 MG tablet Take 1 tablet (10 mg total) by mouth 2 (two) times daily. 180 tablet 1   diazepam  (VALIUM ) 10 MG tablet Take 1 tablet (10 mg total) by mouth as needed for anxiety. 30 tablet 0   finasteride (PROSCAR) 5 MG tablet Take 2.5 mg by mouth daily.     hydrochlorothiazide  (HYDRODIURIL ) 12.5 MG tablet Take 1 tablet (12.5 mg total) by mouth daily. 90 tablet 1    rosuvastatin  (CRESTOR ) 10 MG tablet Take 1 tablet (10 mg total) by mouth daily. 90 tablet 3   spironolactone (ALDACTONE) 100 MG tablet Take 100 mg by mouth daily.     valACYclovir  (VALTREX ) 1000 MG tablet Take 2 tablets (2,000 mg total) by mouth and repeat in 12 hours in event of an outbreak. 30 tablet 1   Semaglutide , 1 MG/DOSE, (OZEMPIC , 1 MG/DOSE,) 4 MG/3ML SOPN Using compounded per online provider (Patient not taking: Reported on 02/25/2024)     sertraline  (ZOLOFT ) 50 MG tablet Take 1/2 tablet by mouth once daily for 1 week, then increase as directed. 30 tablet 1   No facility-administered medications prior to visit.    Allergies  Allergen Reactions   Morphine  Itching    Other reaction(s): Other (See Comments)    ROS    See HPI Objective:    Physical Exam Constitutional:      General: She is not in acute distress.    Appearance: Normal appearance. She is well-developed.  HENT:     Head: Normocephalic and atraumatic.     Right Ear: External ear normal.     Left Ear: External ear normal.  Eyes:     General: No scleral icterus. Neck:     Thyroid : No thyromegaly.  Cardiovascular:     Rate and Rhythm: Normal rate and regular rhythm.     Heart sounds: Normal heart sounds. No murmur heard. Pulmonary:     Effort: Pulmonary effort is normal. No respiratory distress.     Breath sounds: Normal breath sounds.  No wheezing.  Musculoskeletal:     Cervical back: Neck supple.  Skin:    General: Skin is warm and dry.  Neurological:     Mental Status: She is alert and oriented to person, place, and time.  Psychiatric:        Mood and Affect: Mood normal.        Behavior: Behavior normal.        Thought Content: Thought content normal.        Judgment: Judgment normal.      BP 122/82 (BP Location: Right Arm, Patient Position: Sitting, Cuff Size: Normal)   Pulse 78   Temp 98.4 F (36.9 C) (Oral)   Resp 16   Ht 5' 7 (1.702 m)   Wt 209 lb (94.8 kg)   LMP 07/02/2015  (Approximate)   SpO2 100%   BMI 32.73 kg/m  Wt Readings from Last 3 Encounters:  04/14/24 209 lb (94.8 kg)  02/25/24 213 lb 12.8 oz (97 kg)  01/25/24 211 lb (95.7 kg)       Assessment & Plan:   Problem List Items Addressed This Visit       Unprioritized   Vitamin D  deficiency - Primary   Relevant Orders   Vitamin D  (25 hydroxy) (Completed)   Prediabetes   Relevant Orders   HgB A1c (Completed)   Obesity (BMI 30.0-34.9)    Tirzepatide  (Zepbound ) effective with significant weight loss and improved blood pressure. Cost concern noted but committed to treatment. - Continue tirzepatide  (Zepbound ). - Monitor weight and blood pressure regularly. - Follow up with [facility].       Hypertension    Potential overtreatment with continued weight loss noted. - Continue current antihypertensive regimen.  - Monitor blood pressure weekly at home - Report if systolic blood pressure drops below 100 mmHg.       Relevant Orders   Basic Metabolic Panel (BMET) (Completed)    I have discontinued Palak Marty's Ozempic  (1 MG/DOSE) and sertraline . I am also having her maintain her finasteride, rosuvastatin , spironolactone, diazepam , hydrochlorothiazide , buPROPion , valACYclovir , busPIRone , Zepbound , and phentermine.  No orders of the defined types were placed in this encounter.

## 2024-04-15 LAB — BASIC METABOLIC PANEL WITH GFR
BUN: 18 mg/dL (ref 7–25)
CO2: 27 mmol/L (ref 20–32)
Calcium: 9.8 mg/dL (ref 8.6–10.4)
Chloride: 103 mmol/L (ref 98–110)
Creat: 0.82 mg/dL (ref 0.50–1.03)
Glucose, Bld: 86 mg/dL (ref 65–99)
Potassium: 4 mmol/L (ref 3.5–5.3)
Sodium: 139 mmol/L (ref 135–146)
eGFR: 85 mL/min/1.73m2 (ref >=60)

## 2024-04-15 LAB — HEMOGLOBIN A1C
Hgb A1c MFr Bld: 5.5 % (ref <5.7)
Mean Plasma Glucose: 111 mg/dL
eAG (mmol/L): 6.2 mmol/L

## 2024-04-15 LAB — VITAMIN D 25 HYDROXY (VIT D DEFICIENCY, FRACTURES): Vit D, 25-Hydroxy: 48 ng/mL (ref 30–100)

## 2024-04-16 ENCOUNTER — Encounter: Payer: Self-pay | Admitting: Family

## 2024-04-17 ENCOUNTER — Telehealth (INDEPENDENT_AMBULATORY_CARE_PROVIDER_SITE_OTHER): Admitting: Adult Health

## 2024-04-17 ENCOUNTER — Ambulatory Visit: Payer: Self-pay | Admitting: Family

## 2024-04-17 ENCOUNTER — Encounter: Payer: Self-pay | Admitting: Adult Health

## 2024-04-17 DIAGNOSIS — F331 Major depressive disorder, recurrent, moderate: Secondary | ICD-10-CM

## 2024-04-17 DIAGNOSIS — F411 Generalized anxiety disorder: Secondary | ICD-10-CM

## 2024-04-17 MED ORDER — SERTRALINE HCL 100 MG PO TABS: 100.0000 mg | ORAL_TABLET | Freq: Every day | ORAL | 1 refills | Status: DC

## 2024-04-17 NOTE — Assessment & Plan Note (Signed)
  Potential overtreatment with continued weight loss noted. - Continue current antihypertensive regimen.  - Monitor blood pressure weekly at home - Report if systolic blood pressure drops below 100 mmHg.

## 2024-04-17 NOTE — Assessment & Plan Note (Signed)
  Tirzepatide  (Zepbound ) effective with significant weight loss and improved blood pressure. Cost concern noted but committed to treatment. - Continue tirzepatide  (Zepbound ). - Monitor weight and blood pressure regularly. - Follow up with [facility].

## 2024-04-17 NOTE — Progress Notes (Signed)
 Lori Montgomery [id number] 1970/11/12 53 y.o.  Virtual Visit via Video Note  I connected with pt @ on 04/17/24 at  8:30 AM EDT by a video enabled telemedicine application and verified that I am speaking with the correct person using two identifiers.   I discussed the limitations of evaluation and management by telemedicine and the availability of in person appointments. The patient expressed understanding and agreed to proceed.  I discussed the assessment and treatment plan with the patient. The patient was provided an opportunity to ask questions and all were answered. The patient agreed with the plan and demonstrated an understanding of the instructions.   The patient was advised to call back or seek an in-person evaluation if the symptoms worsen or if the condition fails to improve as anticipated.  I provided 15 minutes of non-face-to-face time during this encounter.  The patient was located at home.  The provider was located at [facility].   Angeline LOISE Sayers, NP   Subjective:   Patient ID:  Lori Montgomery is a 53 y.o. (DOB 07-May-1971) female.  Chief Complaint: No chief complaint on file.   HPI Lori Montgomery presents for follow-up of MDD and GAD.  Mood symptoms - reports one episode of anxiety. Denies irritability, but is a bit more emotional. Denies depression. Reports stable interest and motivation. Denies panic attacks. Reports decreased worry. Reports rumination and over thinking. Denies intrusive thoughts. Denies obsessive thoughts or acts. Reports mood is stable. Stating I think things are going better. Taking medications as prescribed.  Energy levels stable. Active, does not have a regular exercise routine. Works full-time  Enjoys some usual interests and activities. Married. Lives with husband - 2 children - 71 and 20. Spending time with family. Appetite adequate. Weight loss - started Zepbound . Reports sleeping better at night. Averages 8 hours of sleep. Reports  difficulties with focus and concentration. Reports difficulties organization. Completing tasks. Managing aspects of household. Suspects ADD, never tested. Works full time Financial controller - traveling. Denies SI or HI.  Denies AH or VH. Denies self harm.  Denies substance use.  Previous medication trials:   Wellbutrin   Buspar  Lexapro  Prozac Celexa  Review of Systems:  Review of Systems  Musculoskeletal:  Negative for gait problem.  Neurological:  Negative for tremors.  Psychiatric/Behavioral:         Please refer to HPI    Medications: I have reviewed the patient's current medications.  Current Outpatient Medications  Medication Sig Dispense Refill   buPROPion  (WELLBUTRIN  XL) 300 MG 24 hr tablet Take 1 tablet (300 mg total) by mouth daily. 90 tablet 1   busPIRone  (BUSPAR ) 10 MG tablet Take 1 tablet (10 mg total) by mouth 2 (two) times daily. 180 tablet 1   diazepam  (VALIUM ) 10 MG tablet Take 1 tablet (10 mg total) by mouth as needed for anxiety. 30 tablet 0   finasteride (PROSCAR) 5 MG tablet Take 2.5 mg by mouth daily.     hydrochlorothiazide  (HYDRODIURIL ) 12.5 MG tablet Take 1 tablet (12.5 mg total) by mouth daily. 90 tablet 1   phentermine 37.5 MG capsule Take 37.5 mg by mouth every morning.     rosuvastatin  (CRESTOR ) 10 MG tablet Take 1 tablet (10 mg total) by mouth daily. 90 tablet 3   sertraline  (ZOLOFT ) 100 MG tablet Take 100 mg by mouth daily.     spironolactone (ALDACTONE) 100 MG tablet Take 100 mg by mouth daily.     tirzepatide  (ZEPBOUND ) 15 MG/0.5ML Pen Inject 15 mg into  the skin once a week. Taking 7.5     valACYclovir  (VALTREX ) 1000 MG tablet Take 2 tablets (2,000 mg total) by mouth and repeat in 12 hours in event of an outbreak. 30 tablet 1   No current facility-administered medications for this visit.    Medication Side Effects: None  Allergies:  Allergies  Allergen Reactions   Morphine  Itching    Other reaction(s): Other (See Comments)    Past Medical History:   Diagnosis Date   Anxiety    B12 deficiency    Bilateral knee pain    Constipation    Depression    Depression    GERD (gastroesophageal reflux disease)    Granuloma annulare    Hypertension    IBS (irritable bowel syndrome)    Morbid obesity with BMI of 40.0-44.9, adult (HCC)    OSA (obstructive sleep apnea)    Other fatigue    PONV (postoperative nausea and vomiting)    states also had localized reaction to IV meds during/following endoscopy 2011 requiring IV Benadryl - ? location of test   Swallowing difficulty    Vitamin D  deficiency     Family History  Problem Relation Age of Onset   Obesity Mother    Eating disorder Mother    Depression Mother    Stroke Mother    Hyperlipidemia Mother    Hypertension Mother    Diabetes Mother        stroke, hypertension   Heart failure Mother    Sleep apnea Father    Depression Father    Kidney disease Father    Hyperlipidemia Father    Hypertension Father    Diabetes Father        minor heart attack   Lung cancer Maternal Grandmother    Heart attack Maternal Grandfather    Heart disease Paternal Grandmother        MI   Heart disease Paternal Grandfather        MI   Breast cancer Maternal Aunt    Kidney disease Other    Depression Other    Sleep apnea Other    Eating disorder Other    Obesity Other     Social History   Socioeconomic History   Marital status: Married    Spouse name: Deward   Number of children: 4   Years of education: Not on file   Highest education level: Some college, no degree  Occupational History   Occupation: Investment banker, corporate: [employer]  Tobacco Use   Smoking status: Former    Current packs/day: 0.00    Average packs/day: 0.5 packs/day for 33.0 years (17.1 ttl pk-yrs)    Types: Cigarettes    Start date: 09/10/1987    Quit date: 08/10/2019    Years since quitting: 4.6   Smokeless tobacco: Never   Tobacco comments:    1 pack/week (05/10/14)  Vaping Use   Vaping status: Never Used   Substance and Sexual Activity   Alcohol use: Yes    Comment: rarely   Drug use: No   Sexual activity: Yes    Partners: Male  Other Topics Concern   Not on file  Social History Narrative   One son- 2001  works for [employer], lives on his own   On daughter- 2006 senior in [employer]   Married   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)   Completed some college   Enjoys crafts   3 dogs   Social  Drivers of Corporate investment banker Strain: Low Risk  (04/14/2024)   Overall Financial Resource Strain (CARDIA)    Difficulty of Paying Living Expenses: Not hard at all  Food Insecurity: No Food Insecurity (04/14/2024)   Hunger Vital Sign    Worried About Running Out of Food in the Last Year: Never true    Ran Out of Food in the Last Year: Never true  Transportation Needs: No Transportation Needs (04/14/2024)   PRAPARE - Administrator, Civil Service (Medical): No    Lack of Transportation (Non-Medical): No  Physical Activity: Insufficiently Active (04/14/2024)   Exercise Vital Sign    Days of Exercise per Week: 1 day    Minutes of Exercise per Session: 10 min  Stress: No Stress Concern Present (04/14/2024)   [employer] of Occupational Health - Occupational Stress Questionnaire    Feeling of Stress: Only a little  Social Connections: Moderately Integrated (04/14/2024)   Social Connection and Isolation Panel    Frequency of Communication with Friends and Family: More than three times a week    Frequency of Social Gatherings with Friends and Family: Twice a week    Attends Religious Services: 1 to 4 times per year    Active Member of [employer] or Organizations: No    Attends Engineer, structural: Not on file    Marital Status: Married  Intimate Partner Violence: Unknown (11/10/2021)   Received from [facility]   HITS    Physically Hurt: Not on file    Insult or Talk Down To: Not on file    Threaten Physical Harm: Not on file    Scream or Curse: Not on file     Past Medical History, Surgical history, Social history, and Family history were reviewed and updated as appropriate.   Please see review of systems for further details on the patient's review from today.   Objective:   Physical Exam:  LMP 07/02/2015 (Approximate)   Physical Exam Constitutional:      General: She is not in acute distress. Musculoskeletal:        General: No deformity.  Neurological:     Mental Status: She is alert and oriented to person, place, and time.     Coordination: Coordination normal.  Psychiatric:        Attention and Perception: Attention and perception normal. She does not perceive auditory or visual hallucinations.        Mood and Affect: Mood normal. Mood is not anxious or depressed. Affect is not labile, blunt, angry or inappropriate.        Speech: Speech normal.        Behavior: Behavior normal.        Thought Content: Thought content normal. Thought content is not paranoid or delusional. Thought content does not include homicidal or suicidal ideation. Thought content does not include homicidal or suicidal plan.        Cognition and Memory: Cognition and memory normal.        Judgment: Judgment normal.     Comments: Insight intact     Lab Review:     Component Value Date/Time   NA 139 04/14/2024 1629   NA 142 01/29/2022 0909   K 4.0 04/14/2024 1629   CL 103 04/14/2024 1629   CO2 27 04/14/2024 1629   GLUCOSE 86 04/14/2024 1629   BUN 18 04/14/2024 1629   BUN 13 01/29/2022 0909   CREATININE 0.82 04/14/2024 1629   CALCIUM  9.8  04/14/2024 1629   PROT 6.8 10/01/2023 1522   PROT 6.6 01/29/2022 0909   ALBUMIN 4.3 01/29/2022 0909   AST 13 10/01/2023 1522   ALT 11 10/01/2023 1522   ALKPHOS 60 01/29/2022 0909   BILITOT 0.3 10/01/2023 1522   BILITOT 0.6 01/29/2022 0909   GFRNONAA >60 07/09/2015 0828   GFRAA >60 07/09/2015 0828       Component Value Date/Time   WBC 7.1 10/01/2023 1522   RBC 4.41 10/01/2023 1522   HGB 13.6 10/01/2023  1522   HGB 13.2 04/17/2021 1028   HCT 41.8 10/01/2023 1522   HCT 41.7 04/17/2021 1028   PLT 233 10/01/2023 1522   PLT 245 04/17/2021 1028   MCV 94.8 10/01/2023 1522   MCV 91 04/17/2021 1028   MCH 30.8 10/01/2023 1522   MCHC 32.5 10/01/2023 1522   RDW 11.8 10/01/2023 1522   RDW 13.0 04/17/2021 1028   LYMPHSABS 2.6 04/17/2021 1028   MONOABS 0.5 03/01/2014 0525   EOSABS 270 10/01/2023 1522   EOSABS 0.2 04/17/2021 1028   BASOSABS 28 10/01/2023 1522   BASOSABS 0.0 04/17/2021 1028    No results found for: POCLITH, LITHIUM   No results found for: PHENYTOIN, PHENOBARB, VALPROATE, CBMZ   .res Assessment: Plan:    Treatment Plan/Recommendations:   Plan:  PDMP reviewed  Wellbutrin  XL 300mg  daily Buspar  10mg  BID Zoloft  100mg  daily    Valium  10mg  daily - taking once every 6 months  15 minutes spent dedicated to the care of this patient on the date of this encounter to include pre-visit review of records, ordering of medication, post visit documentation, and face-to-face time with the patient discussing depression and anxiety. Discussed continuing current medication regimen - Zoloft  50mg  started less than a month ago.  RTC 4 weeks  Patient advised to contact office with any questions, adverse effects, or acute worsening in signs and symptoms.   There are no diagnoses linked to this encounter.   Please see After Visit Summary for patient specific instructions.  Future Appointments  Date Time Provider Department Center  04/17/2024  8:30 AM Genita Nilsson, Angeline Mattocks, NP [facility] None  05/17/2024  9:00 AM Hilty, Vinie BROCKS, MD [facility]    No orders of the defined types were placed in this encounter.     -------------------------------

## 2024-04-19 ENCOUNTER — Ambulatory Visit

## 2024-05-09 ENCOUNTER — Encounter: Payer: Self-pay | Admitting: Family

## 2024-05-10 ENCOUNTER — Other Ambulatory Visit: Payer: Self-pay

## 2024-05-10 MED ORDER — ROSUVASTATIN CALCIUM 10 MG PO TABS: 10.0000 mg | ORAL_TABLET | Freq: Every day | ORAL | 3 refills | Status: AC

## 2024-05-10 MED ORDER — HYDROCHLOROTHIAZIDE 12.5 MG PO TABS: 12.5000 mg | ORAL_TABLET | Freq: Every day | ORAL | 1 refills | Status: DC

## 2024-05-10 NOTE — Telephone Encounter (Signed)
 Rx sent for bp medication and Crestor 

## 2024-05-16 ENCOUNTER — Other Ambulatory Visit: Payer: Self-pay | Admitting: Family

## 2024-05-16 DIAGNOSIS — F419 Anxiety disorder, unspecified: Secondary | ICD-10-CM

## 2024-05-17 ENCOUNTER — Encounter: Payer: Self-pay | Admitting: Internal Medicine

## 2024-05-17 ENCOUNTER — Ambulatory Visit: Attending: Cardiology | Admitting: Internal Medicine

## 2024-05-17 ENCOUNTER — Other Ambulatory Visit: Payer: Self-pay | Admitting: Family

## 2024-05-17 VITALS — BP 102/70 | HR 97 | Ht 67.0 in | Wt 207.6 lb

## 2024-05-17 DIAGNOSIS — E669 Obesity, unspecified: Secondary | ICD-10-CM

## 2024-05-17 DIAGNOSIS — E785 Hyperlipidemia, unspecified: Secondary | ICD-10-CM | POA: Diagnosis not present

## 2024-05-17 DIAGNOSIS — E7849 Other hyperlipidemia: Secondary | ICD-10-CM

## 2024-05-17 DIAGNOSIS — R252 Cramp and spasm: Secondary | ICD-10-CM

## 2024-05-17 DIAGNOSIS — I2510 Atherosclerotic heart disease of native coronary artery without angina pectoris: Secondary | ICD-10-CM

## 2024-05-17 DIAGNOSIS — G4733 Obstructive sleep apnea (adult) (pediatric): Secondary | ICD-10-CM

## 2024-05-17 NOTE — Progress Notes (Signed)
 OFFICE NOTE  Chief Complaint:  Cardiovascular risk assessment  Primary Care Physician: Lori Setter, NP  HPI:  Lori Montgomery is a pleasant 53 year old female Who has a strong family history of coronary disease in both parents and grandparents. Her past medical history significant for morbid obesity and she status post lap banding and eventually gastric bypass surgery. Recently she's lost about 30 pounds. She's also struggled with bipolar depression and recently had a change in her medications which she says makes her feel more fatigued and she does not feel that it is necessarily as effective. She denies any symptoms of chest pain or shortness of breath that are concerning for angina. She occasionally gets a sharp pain in the chest it lasts only for a few seconds. She is operatively active due to fatigue which again may be related to her medications. It also may be related to the fact that she's had decreased food intake after her gastric bypass.  05/17/2024  Lori Montgomery is seen as a new patient today.  I last saw her about 10 years ago.  Recently she was in the office with her mother who is a patient of Lori Montgomery.  She has been having some chest pain and other symptoms and he ordered a stress test and a calcium  score on her.  Her calcium  score did come back abnormal at 8.24, 85th percentile for age and sex matched controls.  An exercise stress test however was negative.  She has continued to lose a significant amount of weight.  She has more recently done very well on Zepbound , currently on the 15 mg dose however she was not able to get it approved and therefore is paying about $500 a month.  Over the past year however she was diagnosed with moderate obstructive sleep apnea.  Unfortunately she could not tolerate CPAP however based on her BMI of 32 and moderate obstructive sleep apnea, she should qualify for insurance coverage for the Zepbound .  This will be important as continued weight  loss may improve her sleep apnea since she cannot tolerate the CPAP.  Also she has recently been having some cramps in her left lower leg.  It is possible this could be related to electrolytes as she is on some HCTZ.  She came off of a lot of her blood pressure medicines due to weight loss, and may be able to come off of HCTZ.  She is due for repeat lipids.  She was started on 10 mg rosuvastatin  by Dr. JAYSON and has not had repeat lipids.  PMHx:  Past Medical History:  Diagnosis Date   Anxiety    B12 deficiency    Bilateral knee pain    Constipation    Depression    Depression    GERD (gastroesophageal reflux disease)    Granuloma annulare    Hypertension    IBS (irritable bowel syndrome)    Morbid obesity with BMI of 40.0-44.9, adult (HCC)    OSA (obstructive sleep apnea)    Other fatigue    PONV (postoperative nausea and vomiting)    states also had localized reaction to IV meds during/following endoscopy 2011 requiring IV Benadryl - ? location of test   Swallowing difficulty    Vitamin D  deficiency     Past Surgical History:  Procedure Laterality Date   ABDOMINAL HYSTERECTOMY N/A 07/11/2015   Procedure: HYSTERECTOMY ABDOMINAL;  Surgeon: Lori Corona, MD;  Location: [facility];  Service: Gynecology;  Laterality: N/A;   BILATERAL SALPINGECTOMY  07/11/2015  Procedure: BILATERAL SALPINGECTOMY;  Surgeon: Lori Corona, MD;  Location: [facility];  Service: Gynecology;;   BREAST REDUCTION SURGERY  08/10/2006   with scar revision   BREAST SURGERY     CYST REMOVAL HAND     DIAGNOSTIC LAPAROSCOPY     DILATION AND CURETTAGE OF UTERUS     x 3 for miscarriages   GASTRIC ROUX-EN-Y N/A 02/27/2014   Procedure:  LAPAROSCOPIC REMOVAL OF LAP BAND AND PORT,LAPAROSCOPIC ROUX-EN-Y GASTRIC BYPASS WITH UPPER ENDOSCOPY, ;  Surgeon: Lori CHRISTELLA Blush, MD;  Location: [facility];  Service: General;  Laterality: N/A;   KNEE ARTHROSCOPY     right knee/ with ACL REPAIR   LAPAROSCOPIC CHOLECYSTECTOMY  03/10/2010   Dr  Lori Montgomery   LAPAROSCOPIC GASTRIC BANDING N/A 10/04/2012   Procedure: LAPAROSCOPIC GASTRIC BANDING;  Surgeon: Lori CHRISTELLA Blush, MD,FACS;  Location: [facility];  Service: General;  Laterality: N/A;  Laparoscopic Adjustable Gastric Banding,    MESH APPLIED TO LAP PORT N/A 10/04/2012   Procedure: MESH APPLIED TO LAP PORT;  Surgeon: Lori CHRISTELLA Blush, MD,FACS;  Location: [facility];  Service: General;  Laterality: N/A;    FAMHx:  Family History  Problem Relation Age of Onset   Obesity Mother    Eating disorder Mother    Depression Mother    Stroke Mother    Hyperlipidemia Mother    Hypertension Mother    Diabetes Mother        stroke, hypertension   Heart failure Mother    Sleep apnea Father    Depression Father    Kidney disease Father    Hyperlipidemia Father    Hypertension Father    Diabetes Father        minor heart attack   Lung cancer Maternal Grandmother    Heart attack Maternal Grandfather    Heart disease Paternal Grandmother        MI   Heart disease Paternal Grandfather        MI   Breast cancer Maternal Aunt    Kidney disease Other    Depression Other    Sleep apnea Other    Eating disorder Other    Obesity Other     SOCHx:   reports that she quit smoking about 4 years ago. Her smoking use included cigarettes. She started smoking about 36 years ago. She has a 17.1 pack-year smoking history. She has never used smokeless tobacco. She reports current alcohol use. She reports that she does not use drugs.  ALLERGIES:  Allergies  Allergen Reactions   Morphine  Itching    Other reaction(s): Other (See Comments)    ROS: A comprehensive review of systems was negative.  HOME MEDS: Current Outpatient Medications  Medication Sig Dispense Refill   buPROPion  (WELLBUTRIN  XL) 300 MG 24 hr tablet TAKE 1 TABLET BY MOUTH DAILY 90 tablet 1   busPIRone  (BUSPAR ) 10 MG tablet Take 1 tablet (10 mg total) by mouth 2 (two) times daily. 180 tablet 1   diazepam  (VALIUM ) 10 MG tablet Take 1 tablet  (10 mg total) by mouth as needed for anxiety. 30 tablet 0   hydrochlorothiazide  (HYDRODIURIL ) 12.5 MG tablet Take 1 tablet (12.5 mg total) by mouth daily. 90 tablet 1   minoxidil (LONITEN) 10 MG tablet Take 10 mg by mouth daily.     phentermine 37.5 MG capsule Take 37.5 mg by mouth every morning.     rosuvastatin  (CRESTOR ) 10 MG tablet Take 1 tablet (10 mg total) by mouth daily. 90 tablet 3  sertraline  (ZOLOFT ) 100 MG tablet Take 1 tablet (100 mg total) by mouth daily. 90 tablet 1   spironolactone (ALDACTONE) 100 MG tablet Take 100 mg by mouth daily.     tirzepatide  (ZEPBOUND ) 15 MG/0.5ML Pen Inject 15 mg into the skin once a week. Taking 7.5     valACYclovir  (VALTREX ) 1000 MG tablet Take 2 tablets (2,000 mg total) by mouth and repeat in 12 hours in event of an outbreak. 30 tablet 1   No current facility-administered medications for this visit.    LABS/IMAGING: No results found for this or any previous visit (from the past 48 hours). No results found.  VITALS: BP 102/70   Pulse 97   Ht 5' 7 (1.702 m)   Wt 207 lb 9.6 oz (94.2 kg)   LMP 07/02/2015 (Approximate)   SpO2 97%   BMI 32.51 kg/m   EXAM: General appearance: alert, no distress and moderately obese Neck: no carotid bruit and no JVD Lungs: clear to auscultation bilaterally Heart: regular rate and rhythm, S1, S2 normal, no murmur, click, rub or gallop Abdomen: soft, non-tender; bowel sounds normal; no masses,  no organomegaly Extremities: extremities normal, atraumatic, no cyanosis or edema Pulses: 2+ and symmetric Skin: Skin color, texture, turgor normal. No rashes or lesions Neurologic: Grossly normal Psych: Pleasant  EKG: EKG Interpretation Date/Time:  Wednesday May 17 2024 09:09:34 EDT Ventricular Rate:  76 PR Interval:  158 QRS Duration:  90 QT Interval:  358 QTC Calculation: 402 R Axis:   83  Text Interpretation: Normal sinus rhythm Normal ECG When compared with ECG of 09-Jul-2015 08:31, No significant  change was found Confirmed by Mona Kent [phone]) on 05/17/2024 9:24:29 AM    ASSESSMENT: Family history of premature coronary disease in her mother CAC score of 8.24, 85th percentile (09/2023) Morbid obesity status post gastric bypass Hypertension Dyslipidemia, goal LDL less than 70 Moderate OSA-intolerant to CPAP  PLAN: 1.   Ms. Galiano has done very well with significant weight loss, having had prior gastric bypass surgery and now is on Zepbound .  Unfortunately she has had to pay for it out-of-pocket which is about $5 a month.  She was diagnosed with moderate sleep apnea this past year but is intolerant to CPAP.  Given her BMI of 32 and sleep apnea I think she should qualify for insurance to cover her Zepbound .  I will ask our Pharm.D.'s to see if they can get that approved for her.  She should continue on it since she has had great success.  I suspect she might be able to come off of HCTZ.  Will get labs today including NMR and LP(a) as well as a metabolic profile and magnesium to see if this is playing a role in her cramping.  It could also be her statin she might need a 2-week statin holiday.  I will discuss this further with her.  Plan follow-up otherwise annually or sooner as necessary  Kent KYM Mona, MD, [facility], FNLA, FACP  [place]  [facility]  Medical Director of the Advanced Lipid Disorders &  Cardiovascular Risk Reduction Clinic Diplomate of the American Board of Clinical Lipidology Attending Cardiologist  Direct Dial: [phone]  Fax: [phone]  Website:  [email or web address] Kent BROCKS Isobel Eisenhuth 05/17/2024, 9:46 AM

## 2024-05-17 NOTE — Patient Instructions (Signed)
 Medication Instructions:  NO CHANGES   *If you need a refill on your cardiac medications before your next appointment, please call your pharmacy*  Lab Work: NMR lipoprofile, LPa, BMET, Magnesium today -- 1st Floor  If you have labs (blood work) drawn today and your tests are completely normal, you will receive your results only by: MyChart Message (if you have MyChart) OR A paper copy in the mail If you have any lab test that is abnormal or we need to change your treatment, we will call you to review the results.   Follow-Up: At [facility], you and your health needs are our priority.  As part of our continuing mission to provide you with exceptional heart care, our providers are all part of one team.  This team includes your primary Cardiologist (physician) and Advanced Practice Providers or APPs (Physician Assistants and Nurse Practitioners) who all work together to provide you with the care you need, when you need it.  Your next appointment:    12 months  We recommend signing up for the patient portal called MyChart.  Sign up information is provided on this After Visit Summary.  MyChart is used to connect with patients for Virtual Visits (Telemedicine).  Patients are able to view lab/test results, encounter notes, upcoming appointments, etc.  Non-urgent messages can be sent to your provider as well.   To learn more about what you can do with MyChart, go to [email or web address].   Other Instructions You have been referred to our clinical pharamcy team to discuss getting GLP1- Zepound

## 2024-05-18 ENCOUNTER — Ambulatory Visit: Payer: Self-pay | Admitting: Internal Medicine

## 2024-05-18 LAB — BASIC METABOLIC PANEL WITH GFR
BUN/Creatinine Ratio: 18 (ref 9–23)
BUN: 17 mg/dL (ref 6–24)
CO2: 20 mmol/L (ref 20–29)
Calcium: 9.8 mg/dL (ref 8.7–10.2)
Chloride: 102 mmol/L (ref 96–106)
Creatinine, Ser: 0.94 mg/dL (ref 0.57–1.00)
Glucose: 83 mg/dL (ref 70–99)
Potassium: 4.2 mmol/L (ref 3.5–5.2)
Sodium: 139 mmol/L (ref 134–144)
eGFR: 72 mL/min/1.73 (ref >59)

## 2024-05-18 LAB — LIPOPROTEIN A (LPA): Lipoprotein (a): <8.4 nmol/L (ref <75.0)

## 2024-05-18 LAB — NMR, LIPOPROFILE
Cholesterol, Total: 153 mg/dL (ref 100–199)
HDL Particle Number: 36.7 umol/L (ref >=30.5)
HDL-C: 68 mg/dL (ref >39)
LDL Particle Number: 724 nmol/L (ref <1000)
LDL Size: 20.7 nm (ref >20.5)
LDL-C (NIH Calc): 74 mg/dL (ref 0–99)
LP-IR Score: 32 (ref <=45)
Small LDL Particle Number: 295 nmol/L (ref <=527)
Triglycerides: 53 mg/dL (ref 0–149)

## 2024-05-18 LAB — MAGNESIUM: Magnesium: 2.2 mg/dL (ref 1.6–2.3)

## 2024-05-19 ENCOUNTER — Telehealth: Payer: Self-pay | Admitting: Internal Medicine

## 2024-05-19 NOTE — Telephone Encounter (Signed)
 Patient has an appt on 10/22 with PharmD, she wants to know if this can be virtual. She states that her work has her going out of town unexpectedly quite often and isn't sure if she will even be in town on 10/22. Please advise.

## 2024-05-26 ENCOUNTER — Encounter: Payer: Self-pay | Admitting: Family

## 2024-05-26 DIAGNOSIS — F32A Depression, unspecified: Secondary | ICD-10-CM

## 2024-05-26 MED ORDER — HYDROCHLOROTHIAZIDE 12.5 MG PO TABS: 12.5000 mg | ORAL_TABLET | Freq: Every day | ORAL | 1 refills | Status: AC

## 2024-05-26 MED ORDER — BUPROPION HCL ER (XL) 300 MG PO TB24: 300.0000 mg | ORAL_TABLET | Freq: Every day | ORAL | 1 refills | Status: AC

## 2024-05-31 ENCOUNTER — Telehealth: Payer: Self-pay | Admitting: Pharmacy Technician

## 2024-05-31 ENCOUNTER — Other Ambulatory Visit (HOSPITAL_COMMUNITY): Payer: Self-pay

## 2024-05-31 ENCOUNTER — Telehealth: Payer: Self-pay | Admitting: Internal Medicine

## 2024-05-31 ENCOUNTER — Telehealth: Payer: Self-pay

## 2024-05-31 ENCOUNTER — Encounter: Payer: Self-pay | Admitting: Internal Medicine

## 2024-05-31 ENCOUNTER — Ambulatory Visit: Attending: Cardiology

## 2024-05-31 DIAGNOSIS — E669 Obesity, unspecified: Secondary | ICD-10-CM

## 2024-05-31 NOTE — Progress Notes (Signed)
 Patient ID: Lori Montgomery                 DOB: 11-16-1970                    MRN: [id number]     HPI: Lori Montgomery is a 53 y.o. female patient referred to pharmacy clinic by Dr. Mona to discuss GLP1-RA therapy. PMH is significant for OSA, HTN, GERD, anxiety/depression, prediabetes, HLD, and obesity (laparoscopic gastric banding on 09/2012). Most recent BMI 32.51 kg/m.  Patient presents today for PharmD televisit. The patient previously underwent laparoscopic gastric banding in 2014 which she states was ineffective, followed by a reversal and subsequent gastric bypass surgery. She experienced a maxium weight loss of about 30 pounds after bypass but eventually regained the weight. She has been on GLP-1 therapy for approximately 1.5 years, initially using compounded semaglutide  without benefit. She was later given a sample of Zepbound  at a weight loss center and achieved a weight loss of approximately 70 lbs on Zebound. She reports that she is currently taking Zepound 10 mg once weekly for weight management. She has been on the 10 mg dose for more than 4 weeks and doesn't think the 10 mg is as effective anymore. She has been purchasing from [employer] which has cost her ~$500/month and is seeking insurance coverage for the medication. She has a history of OSA and will provide results of recent sleep study conducted in 06/2023.   Current weight and BMI: 207 lbs and 32.51 kg/m  Goal weight: 170 - 175 lbs  Diet:  Breakfast: protein and fruit or protein drink Lunch: 4-6 oz of lean meat, cheese, fruit or vegetables (asparagus, brocolli, corn on cob, salads) Dinner: baked protein, vegetable and a serving of carbohydrate She reports that she craves starches but she is working to cut back as much as possible   Exercise: No structured exercise but is motivated to begin aerobic exercise three times per week, starting with 20 minutes each session and increasing as able  Family History:   Relation Problem  Comments  Mother (Deceased) Depression   Diabetes stroke, hypertension  Eating disorder   Heart failure   Hyperlipidemia   Hypertension   Obesity   Stroke     Father Metallurgist) Depression   Diabetes minor heart attack  Hyperlipidemia   Hypertension   Kidney disease   Sleep apnea     Sister Metallurgist)   Brother (Alive)   Maternal Aunt Breast cancer     Maternal Grandmother Lung cancer     Maternal Grandfather Heart attack     Paternal Grandmother Heart disease MI    Paternal Grandfather Heart disease MI    Daughter Metallurgist)   Other Depression   Eating disorder   Kidney disease   Obesity   Sleep apnea     Son Metallurgist)     Social History:  Alcohol: occasionally Smoking: none; quit six years ago   Labs: Lab Results  Component Value Date   HGBA1C 5.5 04/14/2024    Wt Readings from Last 1 Encounters:  05/17/24 207 lb 9.6 oz (94.2 kg)    BP Readings from Last 1 Encounters:  05/17/24 102/70   Pulse Readings from Last 1 Encounters:  05/17/24 97       Component Value Date/Time   CHOL 177 10/01/2023 1522   CHOL 178 01/29/2022 0909   TRIG 75 10/01/2023 1522   HDL 57 10/01/2023 1522   HDL 55 01/29/2022 0909  CHOLHDL 3.1 10/01/2023 1522   VLDL 24 11/01/2013 1023   LDLCALC 104 (H) 10/01/2023 1522    Past Medical History:  Diagnosis Date   Anxiety    B12 deficiency    Bilateral knee pain    Constipation    Depression    Depression    GERD (gastroesophageal reflux disease)    Granuloma annulare    Hypertension    IBS (irritable bowel syndrome)    Morbid obesity with BMI of 40.0-44.9, adult (HCC)    OSA (obstructive sleep apnea)    Other fatigue    PONV (postoperative nausea and vomiting)    states also had localized reaction to IV meds during/following endoscopy 2011 requiring IV Benadryl - ? location of test   Swallowing difficulty    Vitamin D  deficiency     Current Outpatient Medications on File Prior to Visit  Medication Sig Dispense Refill    buPROPion  (WELLBUTRIN  XL) 300 MG 24 hr tablet Take 1 tablet (300 mg total) by mouth daily. 90 tablet 1   busPIRone  (BUSPAR ) 10 MG tablet Take 1 tablet (10 mg total) by mouth 2 (two) times daily. 180 tablet 1   diazepam  (VALIUM ) 10 MG tablet Take 1 tablet (10 mg total) by mouth as needed for anxiety. 30 tablet 0   hydrochlorothiazide  (HYDRODIURIL ) 12.5 MG tablet Take 1 tablet (12.5 mg total) by mouth daily. 90 tablet 1   minoxidil (LONITEN) 10 MG tablet Take 10 mg by mouth daily.     phentermine 37.5 MG capsule Take 37.5 mg by mouth every morning.     rosuvastatin  (CRESTOR ) 10 MG tablet Take 1 tablet (10 mg total) by mouth daily. 90 tablet 3   sertraline  (ZOLOFT ) 100 MG tablet Take 1 tablet (100 mg total) by mouth daily. 90 tablet 1   spironolactone (ALDACTONE) 100 MG tablet Take 100 mg by mouth daily.     tirzepatide  (ZEPBOUND ) 15 MG/0.5ML Pen Inject 15 mg into the skin once a week. Taking 7.5     valACYclovir  (VALTREX ) 1000 MG tablet Take 2 tablets (2,000 mg total) by mouth and repeat in 12 hours in event of an outbreak. 30 tablet 1   No current facility-administered medications on file prior to visit.    Allergies  Allergen Reactions   Morphine  Itching    Other reaction(s): Other (See Comments)     Assessment/Plan:  1. Weight loss - Pharmacotherapy remains appropriate as adjunct to patient's current weight management plan. Continuation of Zepbound  is recommended pending receipt of the most recent sleep study, which will be submitted to insurance provider for coverage consideration.   Confirmed patient not pregnant and no personal or family history of medullary thyroid  carcinoma (MTC) or Multiple Endocrine Neoplasia syndrome type 2 (MEN 2).   Advised patient on common side effects including nausea, diarrhea, dyspepsia, decreased appetite, and fatigue. Counseled patient on reducing meal size and how to titrate medication to minimize side effects. Counseled patient to call if intolerable  side effects or if experiencing dehydration, abdominal pain, or dizziness. Along with pharmacotherapy, the patient will follow dietary modifications and aim for at least 150 minutes of moderate-intensity exercise per week, plus resistance training twice a week (as recommended by the American Heart Association). This resistance training--such as weightlifting, bodyweight exercises, or using resistance bands, adapted to the patient's ability--will help prevent muscle loss.  Follow up in approximately 3 days to review sleep study results and proceed with PA request for Zepbound . Upon approval, a prescription for Zepbound  15 mg  will be sent to the patient's preferred pharmacy.   Ongoing follow-ups will occur every four weeks to monitor dose titration, assess weight loss progress, and evaluate tolerability.  Reyaan Thoma E. Jada Kuhnert, Pharm.D [facility] [address], [place], [place] [address] Phone: [phone]; Fax: [phone]

## 2024-05-31 NOTE — Telephone Encounter (Signed)
 Pt was not sure how to send sleep study to L. White, so she sent it to [employer]. Please advise.    Pt also sent the following message for L. White.   Hi, I needed to send this to the pharmacist, however I am unable to locate her on MyChart. Could you please help me by forwarding this to her? The sleep study doctor said to make sure she looks at the back position being AHI of 25 and that would help my case.  Thank you so much! Lori Montgomery

## 2024-05-31 NOTE — Patient Instructions (Signed)

## 2024-06-01 ENCOUNTER — Ambulatory Visit (INDEPENDENT_AMBULATORY_CARE_PROVIDER_SITE_OTHER): Admitting: Otolaryngology

## 2024-06-01 ENCOUNTER — Encounter (INDEPENDENT_AMBULATORY_CARE_PROVIDER_SITE_OTHER): Payer: Self-pay | Admitting: Otolaryngology

## 2024-06-01 VITALS — BP 112/78 | HR 79 | Ht 67.0 in | Wt 206.0 lb

## 2024-06-01 DIAGNOSIS — J310 Chronic rhinitis: Secondary | ICD-10-CM

## 2024-06-01 DIAGNOSIS — J343 Hypertrophy of nasal turbinates: Secondary | ICD-10-CM

## 2024-06-01 DIAGNOSIS — R0981 Nasal congestion: Secondary | ICD-10-CM | POA: Diagnosis not present

## 2024-06-01 DIAGNOSIS — J342 Deviated nasal septum: Secondary | ICD-10-CM

## 2024-06-01 MED ORDER — AZELASTINE HCL 0.1 % NA SOLN: 2.0000 | Freq: Two times a day (BID) | NASAL | 12 refills | Status: AC

## 2024-06-01 MED ORDER — IPRATROPIUM BROMIDE 0.06 % NA SOLN
2.0000 | Freq: Two times a day (BID) | NASAL | 12 refills | Status: AC | PRN
Start: 2024-06-01 — End: ?

## 2024-06-01 NOTE — Telephone Encounter (Signed)
 I contacted the patient to inform her that the prior authorization request for Zepbound  was denied due to a diagnosis of mild obstructive sleep apnea as indicated by her sleep study. I encouraged her to pursue lifestyle modifications and advised her to reach out to us  if there are any changes to her insurance coverage or if she undergoes a follow-up sleep study. The patient verbalized understanding and stated that she will continue obtaining Zepbound  through [employer] as prescribed at her weight loss center.

## 2024-06-01 NOTE — Patient Instructions (Addendum)
 Use astelin nasal spray two sprays each nostril twice per day; right after, use the atrovent spray two sprays each nostril twice per day Try to collect the fluid and place in the freezer. When you have about 5 mL, [facility] with [facility] order and call us  and let us  know you've sent it.   Clarifix - procedure name

## 2024-06-01 NOTE — Telephone Encounter (Signed)
 Please see other encounter.

## 2024-06-01 NOTE — Telephone Encounter (Signed)
 Pharmacy Patient Advocate Encounter  Received notification from [employer] that Prior Authorization for zepbound  has been DENIED.  Full denial letter will be uploaded to the media tab. See denial reason below.   PA #/Case ID/Reference #: [id number]

## 2024-06-01 NOTE — Progress Notes (Signed)
 Dear Dr. Daryl, Here is my assessment for our mutual patient, Lori Montgomery. Thank you for allowing me the opportunity to care for your patient. Please do not hesitate to contact me should you have any other questions. Sincerely, Dr. Eldora Blanch  Otolaryngology Clinic Note Referring provider: Dr. Daryl HPI:  Initial visit (05/2024): Discussed the use of AI scribe software for clinical note transcription with the patient, who gave verbal consent to proceed.  History of Present Illness Lori Montgomery is a 53 year old female who presents with chronic nasal dripping/rhinorrhea and congestion.  She experiences constant nasal dripping primarily on the left side, described as a sensation of 'constant dripping' and feeling like something coming down through my nose' --- all the time. Frequent sneezing accompanies these symptoms. Atrovent nasal spray provides only temporary relief but does help a lot, but requires frequent use. There are no associated headaches, light sensitivity, or facial pain, meningismus sx. No salty/metallic taste down back of throat or PND. The nasal dripping is not triggered by straining, bending, or specific smells, and there is no seasonality to her symptoms. No history of sinus infections, facial pain, or discolored drainage or hyposmia.  No smell/perfume/food/pressure triggers  Of note, she also does have right sided dripping but left side is worse.   Recent allergy issues have been noted, and Claritin has helped with general allergy symptoms but not with the nasal dripping  She reports prior nasal trauma (son) but denies significant obstruction. No head trauma. Does have intermittent congestion.  She has no history of nasal or ENT surgeries and is not on blood-thinning medications. She has a history of smoking but has quit and does not use a CPAP machine.  No Sinus or head imaging  ENT Surgery: denies Personal or FHx of bleeding dz or anesthesia difficulty:  no  GLP-1: Zepbound  AP/AC: no  Tobacco: former, quit  PMHx: HTN, OSA, DDD, MDD  Independent Review of Additional Tests or Records:  Eleanor Daryl (03/16/2024): noted deviated septum, constant running/dripping of the nose; Dx: Nasal congestion and rhinorrhea; Rx: ref to ENT [facility] (11/12/2020): noted chronic rhinitis, much improvement on atrovent; ; Dx: Chronic rhinitis, NSD; Rx: continue atrovent CBC and CMP 10/01/2023: WBC 7.1, Eos 270; BUN/Cr 15/0.84 PMH/Meds/All/SocHx/FamHx/ROS:   Past Medical History:  Diagnosis Date   Anxiety    B12 deficiency    Bilateral knee pain    Constipation    Depression    Depression    GERD (gastroesophageal reflux disease)    Granuloma annulare    Hypertension    IBS (irritable bowel syndrome)    Morbid obesity with BMI of 40.0-44.9, adult (HCC)    OSA (obstructive sleep apnea)    Other fatigue    PONV (postoperative nausea and vomiting)    states also had localized reaction to IV meds during/following endoscopy 2011 requiring IV Benadryl - ? location of test   Swallowing difficulty    Vitamin D  deficiency      Past Surgical History:  Procedure Laterality Date   ABDOMINAL HYSTERECTOMY N/A 07/11/2015   Procedure: HYSTERECTOMY ABDOMINAL;  Surgeon: Truman Corona, MD;  Location: [facility];  Service: Gynecology;  Laterality: N/A;   BILATERAL SALPINGECTOMY  07/11/2015   Procedure: BILATERAL SALPINGECTOMY;  Surgeon: Truman Corona, MD;  Location: [facility];  Service: Gynecology;;   BREAST REDUCTION SURGERY  08/10/2006   with scar revision   BREAST SURGERY     CYST REMOVAL HAND     DIAGNOSTIC LAPAROSCOPY     DILATION  AND CURETTAGE OF UTERUS     x 3 for miscarriages   GASTRIC ROUX-EN-Y N/A 02/27/2014   Procedure:  LAPAROSCOPIC REMOVAL OF LAP BAND AND PORT,LAPAROSCOPIC ROUX-EN-Y GASTRIC BYPASS WITH UPPER ENDOSCOPY, ;  Surgeon: Camellia CHRISTELLA Blush, MD;  Location: [facility];  Service: General;  Laterality: N/A;   KNEE ARTHROSCOPY     right knee/  with ACL REPAIR   LAPAROSCOPIC CHOLECYSTECTOMY  03/10/2010   Dr Sheldon   LAPAROSCOPIC GASTRIC BANDING N/A 10/04/2012   Procedure: LAPAROSCOPIC GASTRIC BANDING;  Surgeon: Camellia CHRISTELLA Blush, MD,FACS;  Location: [facility];  Service: General;  Laterality: N/A;  Laparoscopic Adjustable Gastric Banding,    MESH APPLIED TO LAP PORT N/A 10/04/2012   Procedure: MESH APPLIED TO LAP PORT;  Surgeon: Camellia CHRISTELLA Blush, MD,FACS;  Location: [facility];  Service: General;  Laterality: N/A;    Family History  Problem Relation Age of Onset   Obesity Mother    Eating disorder Mother    Depression Mother    Stroke Mother    Hyperlipidemia Mother    Hypertension Mother    Diabetes Mother        stroke, hypertension   Heart failure Mother    Sleep apnea Father    Depression Father    Kidney disease Father    Hyperlipidemia Father    Hypertension Father    Diabetes Father        minor heart attack   Lung cancer Maternal Grandmother    Heart attack Maternal Grandfather    Heart disease Paternal Grandmother        MI   Heart disease Paternal Grandfather        MI   Breast cancer Maternal Aunt    Kidney disease Other    Depression Other    Sleep apnea Other    Eating disorder Other    Obesity Other      Social Connections: Moderately Integrated (04/14/2024)   Social Connection and Isolation Panel    Frequency of Communication with Friends and Family: More than three times a week    Frequency of Social Gatherings with Friends and Family: Twice a week    Attends Religious Services: 1 to 4 times per year    Active Member of [employer] or Organizations: No    Attends Engineer, structural: Not on file    Marital Status: Married      Current Outpatient Medications:    azelastine (ASTELIN) 0.1 % nasal spray, Place 2 sprays into both nostrils 2 (two) times daily. Use in each nostril as directed, Disp: 30 mL, Rfl: 12   buPROPion  (WELLBUTRIN  XL) 300 MG 24 hr tablet, Take 1 tablet (300 mg total) by mouth daily.,  Disp: 90 tablet, Rfl: 1   busPIRone  (BUSPAR ) 10 MG tablet, Take 1 tablet (10 mg total) by mouth 2 (two) times daily., Disp: 180 tablet, Rfl: 1   diazepam  (VALIUM ) 10 MG tablet, Take 1 tablet (10 mg total) by mouth as needed for anxiety., Disp: 30 tablet, Rfl: 0   hydrochlorothiazide  (HYDRODIURIL ) 12.5 MG tablet, Take 1 tablet (12.5 mg total) by mouth daily., Disp: 90 tablet, Rfl: 1   ipratropium (ATROVENT) 0.06 % nasal spray, Place 2 sprays into both nostrils 2 (two) times daily as needed., Disp: 15 mL, Rfl: 12   minoxidil (LONITEN) 10 MG tablet, Take 10 mg by mouth daily., Disp: , Rfl:    phentermine 37.5 MG capsule, Take 37.5 mg by mouth every morning., Disp: , Rfl:  rosuvastatin  (CRESTOR ) 10 MG tablet, Take 1 tablet (10 mg total) by mouth daily., Disp: 90 tablet, Rfl: 3   sertraline  (ZOLOFT ) 100 MG tablet, Take 1 tablet (100 mg total) by mouth daily., Disp: 90 tablet, Rfl: 1   spironolactone (ALDACTONE) 100 MG tablet, Take 100 mg by mouth daily., Disp: , Rfl:    tirzepatide  (ZEPBOUND ) 15 MG/0.5ML Pen, Inject 15 mg into the skin once a week. Taking 7.5, Disp: , Rfl:    valACYclovir  (VALTREX ) 1000 MG tablet, Take 2 tablets (2,000 mg total) by mouth and repeat in 12 hours in event of an outbreak., Disp: 30 tablet, Rfl: 1   Vitamin D , Ergocalciferol , (DRISDOL ) 1.25 MG (50000 UNIT) CAPS capsule, Take 50,000 Units by mouth once a week., Disp: , Rfl:    Physical Exam:   BP 112/78 (BP Location: Left Arm, Patient Position: Sitting, Cuff Size: Large)   Pulse 79   Ht 5' 7 (1.702 m)   Wt 206 lb (93.4 kg)   LMP 07/02/2015 (Approximate)   SpO2 96%   BMI 32.26 kg/m   Salient findings:  CN II-XII intact Bilateral EAC clear and TM intact with well pneumatized middle ear spaces Anterior rhinoscopy: Septum intact - deviates left, likely LLC fracture with tip right dev; bilateral inferior turbinates with modest hypertrophy; Nasal endoscopy was indicated to better evaluate the nose and paranasal sinuses,  given the patient's history and exam findings, and is detailed below. No lesions of oral cavity/oropharynx No obviously palpable neck masses/lymphadenopathy/thyromegaly No respiratory distress or stridor Tilt test neg  Seprately Identifiable Procedures:  Prior to initiating any procedures, risks/benefits/alternatives were explained to the patient and verbal consent obtained. PROCEDURE: Bilateral Diagnostic Rigid Nasal Endoscopy Pre-procedure diagnosis: Concern for rhinitis, nasal congestion Post-procedure diagnosis: same Indication: See pre-procedure diagnosis and physical exam above Complications: None apparent EBL: 0 mL Anesthesia: Lidocaine  4% and topical decongestant was topically sprayed in each nasal cavity  Description of Procedure:  Patient was identified. A rigid 30 degree endoscope was utilized to evaluate the sinonasal cavities, mucosa, sinus ostia and turbinates and septum.  Overall, signs of mucosal inflammation are not noted; left septal deviation so cannot visualize skull base well on left, but no noted pulsatile dripping or secretions or masses over olfactory cleft. No mucopurulence, polyps, or masses noted.   Right Middle meatus: clear Right SE Recess: clear Left MM: clear Left SE Recess: clear Photodocumentation was obtained.  CPT CODE -- 68768 - Mod 25   Impression & Plans:  Ceara Wrightson is a 53 y.o. female with:  1. Chronic rhinitis   2. Nasal septal deviation   3. Hypertrophy of both inferior nasal turbinates    DDX is wide but seems to be more so NAR,, possibly vasomotor though not classic. Discussed CSF leak as remote but present possibility.  Endo overall reassuring.   Atrovent spray provides temporary relief, indicating responsiveness to nasal sprays.   As such, we will try medical mgmt with astelin and atrovent. If responds but cannot do multiple times per day, consider clarifix.  She thinks she can collect it to just to be sure, if can collect, send  for B2 transferrin  F/u 4-6 weeks  See below regarding exact medications prescribed this encounter including dosages and route: Meds ordered this encounter  Medications   azelastine (ASTELIN) 0.1 % nasal spray    Sig: Place 2 sprays into both nostrils 2 (two) times daily. Use in each nostril as directed    Dispense:  30 mL  Refill:  12   ipratropium (ATROVENT) 0.06 % nasal spray    Sig: Place 2 sprays into both nostrils 2 (two) times daily as needed.    Dispense:  15 mL    Refill:  12      Thank you for allowing me the opportunity to care for your patient. Please do not hesitate to contact me should you have any other questions.  Sincerely, Eldora Blanch, MD Otolaryngologist (ENT), [facility] Phone: [phone] Fax: [phone]  06/01/2024, 11:51 AM   MDM:  Level 4 - [id number] Complexity/Problems addressed: mod - chronic problems Data complexity: mod -  independent review of note, labs, ordering test - Morbidity: mod  - Prescription Drug prescribed or managed: y

## 2024-06-01 NOTE — Telephone Encounter (Signed)
 Noted. Please see other encounter.

## 2024-06-28 LAB — BETA 2 TRANSFERRIN: Beta-2 Transferrin, Fluid: NOT DETECTED

## 2024-06-30 ENCOUNTER — Telehealth (INDEPENDENT_AMBULATORY_CARE_PROVIDER_SITE_OTHER): Payer: Self-pay

## 2024-06-30 NOTE — Telephone Encounter (Signed)
 Patient left voicemail stating that she would like to switch providers for her upcoming appointment. Please advise.

## 2024-07-04 ENCOUNTER — Ambulatory Visit (INDEPENDENT_AMBULATORY_CARE_PROVIDER_SITE_OTHER): Payer: Self-pay | Admitting: Otolaryngology

## 2024-07-04 ENCOUNTER — Telehealth (INDEPENDENT_AMBULATORY_CARE_PROVIDER_SITE_OTHER): Payer: Self-pay | Admitting: Otolaryngology

## 2024-07-04 DIAGNOSIS — J343 Hypertrophy of nasal turbinates: Secondary | ICD-10-CM

## 2024-07-04 DIAGNOSIS — R0981 Nasal congestion: Secondary | ICD-10-CM

## 2024-07-04 DIAGNOSIS — J310 Chronic rhinitis: Secondary | ICD-10-CM

## 2024-07-04 DIAGNOSIS — J300 Vasomotor rhinitis: Secondary | ICD-10-CM

## 2024-07-04 NOTE — Telephone Encounter (Signed)
 Pt called wanting to get nose procedure sched this year

## 2024-07-04 NOTE — Telephone Encounter (Signed)
 ENT Note: Patient has had response to atrovent  but cannot tolerate very well. She does have a fair amount of nasal congestion as well. She has researched PNN ablation and she is interested in it. We discussed R/B/A including lack of benefit, bleeding, infection, persistent sx, and ice cream headache. She is willing to proceed. [name] Munos B Geraldina Parrott

## 2024-07-13 ENCOUNTER — Ambulatory Visit (INDEPENDENT_AMBULATORY_CARE_PROVIDER_SITE_OTHER): Admitting: Otolaryngology

## 2024-07-17 ENCOUNTER — Encounter: Payer: Self-pay | Admitting: Adult Health

## 2024-07-17 ENCOUNTER — Telehealth: Admitting: Adult Health

## 2024-07-17 DIAGNOSIS — F411 Generalized anxiety disorder: Secondary | ICD-10-CM

## 2024-07-17 DIAGNOSIS — F331 Major depressive disorder, recurrent, moderate: Secondary | ICD-10-CM

## 2024-07-17 NOTE — Progress Notes (Signed)
 Lori Montgomery [id number] April 02, 1971 53 y.o.  Virtual Visit via Video Note  I connected with pt @ on 07/17/24 at  8:00 AM EST by a video enabled telemedicine application and verified that I am speaking with the correct person using two identifiers.   I discussed the limitations of evaluation and management by telemedicine and the availability of in person appointments. The patient expressed understanding and agreed to proceed.  I discussed the assessment and treatment plan with the patient. The patient was provided an opportunity to ask questions and all were answered. The patient agreed with the plan and demonstrated an understanding of the instructions.   The patient was advised to call back or seek an in-person evaluation if the symptoms worsen or if the condition fails to improve as anticipated.  I provided 20 minutes of non-face-to-face time during this encounter.  The patient was located at home.  The provider was located at [facility].   Angeline LOISE Sayers, NP   Subjective:   Patient ID:  Lori Montgomery is a 53 y.o. (DOB 1970-11-18) female.  Chief Complaint: No chief complaint on file.   HPI Lori Montgomery presents for follow-up of MDD and GAD.  Referred by PCP.  Describes mood today as better. Pleasant. Mood symptoms - reports anxiety at times - situational. Denies depression and irritability. Reports stable interest and motivation. Denies panic attacks. Denies worry. And rumination. Reports over thinking. Denies intrusive thoughts. Reports obsessive thoughts or acts. Reports some relationship issues. Reports mood is stable.Taking medications as prescribed.  Energy levels lower. Active, does not have a regular exercise routine.  Enjoys some usual interests and activities. Married. Lives with husband and 2 children - 48 and 20. Spending time with family. Appetite adequate. Weight stable - appt with healthy weight and wellness.  Sleeps better than she had been.  Averages 8 hours of broken sleep. Reports difficulties with focus and concentration stable. Reports difficulties with organization. Completing tasks. Managing aspects of household. Reports confirmed ADD with recent testing. Works full time financial controller - traveling. Denies SI or HI.  Denies AH or VH. Denies self harm.  Denies substance use.  Previous medication trials:   Wellbutrin   Buspar  Lexapro  Prozac Celexa  Review of Systems:  Review of Systems  Musculoskeletal:  Negative for gait problem.  Neurological:  Negative for tremors.  Psychiatric/Behavioral:         Please refer to HPI    Medications: I have reviewed the patient's current medications.  Current Outpatient Medications  Medication Sig Dispense Refill   azelastine  (ASTELIN ) 0.1 % nasal spray Place 2 sprays into both nostrils 2 (two) times daily. Use in each nostril as directed 30 mL 12   buPROPion  (WELLBUTRIN  XL) 300 MG 24 hr tablet Take 1 tablet (300 mg total) by mouth daily. 90 tablet 1   busPIRone  (BUSPAR ) 10 MG tablet Take 1 tablet (10 mg total) by mouth 2 (two) times daily. 180 tablet 1   diazepam  (VALIUM ) 10 MG tablet Take 1 tablet (10 mg total) by mouth as needed for anxiety. 30 tablet 0   hydrochlorothiazide  (HYDRODIURIL ) 12.5 MG tablet Take 1 tablet (12.5 mg total) by mouth daily. 90 tablet 1   ipratropium (ATROVENT ) 0.06 % nasal spray Place 2 sprays into both nostrils 2 (two) times daily as needed. 15 mL 12   minoxidil (LONITEN) 10 MG tablet Take 10 mg by mouth daily.     phentermine 37.5 MG capsule Take 37.5 mg by mouth every morning.  rosuvastatin  (CRESTOR ) 10 MG tablet Take 1 tablet (10 mg total) by mouth daily. 90 tablet 3   sertraline  (ZOLOFT ) 100 MG tablet Take 1 tablet (100 mg total) by mouth daily. 90 tablet 1   spironolactone (ALDACTONE) 100 MG tablet Take 100 mg by mouth daily.     tirzepatide  (ZEPBOUND ) 15 MG/0.5ML Pen Inject 15 mg into the skin once a week. Taking 7.5     valACYclovir  (VALTREX ) 1000  MG tablet Take 2 tablets (2,000 mg total) by mouth and repeat in 12 hours in event of an outbreak. 30 tablet 1   Vitamin D , Ergocalciferol , (DRISDOL ) 1.25 MG (50000 UNIT) CAPS capsule Take 50,000 Units by mouth once a week.     No current facility-administered medications for this visit.    Medication Side Effects: None  Allergies:  Allergies  Allergen Reactions   Morphine  Itching    Other reaction(s): Other (See Comments)  morphine     Past Medical History:  Diagnosis Date   Anxiety    B12 deficiency    Bilateral knee pain    Constipation    Depression    Depression    GERD (gastroesophageal reflux disease)    Granuloma annulare    Hypertension    IBS (irritable bowel syndrome)    Morbid obesity with BMI of 40.0-44.9, adult (HCC)    OSA (obstructive sleep apnea)    Other fatigue    PONV (postoperative nausea and vomiting)    states also had localized reaction to IV meds during/following endoscopy 2011 requiring IV Benadryl - ? location of test   Swallowing difficulty    Vitamin D  deficiency     Family History  Problem Relation Age of Onset   Obesity Mother    Eating disorder Mother    Depression Mother    Stroke Mother    Hyperlipidemia Mother    Hypertension Mother    Diabetes Mother        stroke, hypertension   Heart failure Mother    Sleep apnea Father    Depression Father    Kidney disease Father    Hyperlipidemia Father    Hypertension Father    Diabetes Father        minor heart attack   Lung cancer Maternal Grandmother    Heart attack Maternal Grandfather    Heart disease Paternal Grandmother        MI   Heart disease Paternal Grandfather        MI   Breast cancer Maternal Aunt    Kidney disease Other    Depression Other    Sleep apnea Other    Eating disorder Other    Obesity Other     Social History   Socioeconomic History   Marital status: Married    Spouse name: Deward   Number of children: 4   Years of education: Not on file    Highest education level: Some college, no degree  Occupational History   Occupation: Investment Banker, Corporate: [employer]  Tobacco Use   Smoking status: Former    Current packs/day: 0.00    Average packs/day: 0.5 packs/day for 33.0 years (17.1 ttl pk-yrs)    Types: Cigarettes    Start date: 09/10/1987    Quit date: 08/10/2019    Years since quitting: 4.9   Smokeless tobacco: Never   Tobacco comments:    1 pack/week (05/10/14)  Vaping Use   Vaping status: Never Used  Substance and Sexual Activity   Alcohol  use: Yes    Comment: rarely   Drug use: No   Sexual activity: Yes    Partners: Male  Other Topics Concern   Not on file  Social History Narrative   One son- 2001  works for [employer], lives on his own   On daughter- 2006 senior in [place]   Married   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[employer])   Completed some college   Enjoys crafts   3 dogs   Social Drivers of Corporate Investment Banker Strain: Low Risk  (04/14/2024)   Overall Financial Resource Strain (CARDIA)    Difficulty of Paying Living Expenses: Not hard at all  Food Insecurity: No Food Insecurity (04/14/2024)   Hunger Vital Sign    Worried About Running Out of Food in the Last Year: Never true    Ran Out of Food in the Last Year: Never true  Transportation Needs: No Transportation Needs (04/14/2024)   PRAPARE - Administrator, Civil Service (Medical): No    Lack of Transportation (Non-Medical): No  Physical Activity: Insufficiently Active (04/14/2024)   Exercise Vital Sign    Days of Exercise per Week: 1 day    Minutes of Exercise per Session: 10 min  Stress: No Stress Concern Present (04/14/2024)   [employer] of Occupational Health - Occupational Stress Questionnaire    Feeling of Stress: Only a little  Social Connections: Moderately Integrated (04/14/2024)   Social Connection and Isolation Panel    Frequency of Communication with Friends and Family: More than three times a week     Frequency of Social Gatherings with Friends and Family: Twice a week    Attends Religious Services: 1 to 4 times per year    Active Member of [employer] or Organizations: No    Attends Engineer, Structural: Not on file    Marital Status: Married  Catering Manager Violence: Not on file    Past Medical History, Surgical history, Social history, and Family history were reviewed and updated as appropriate.   Please see review of systems for further details on the patient's review from today.   Objective:   Physical Exam:  LMP 07/02/2015 (Approximate)   Physical Exam Constitutional:      General: She is not in acute distress. Musculoskeletal:        General: No deformity.  Neurological:     Mental Status: She is alert and oriented to person, place, and time.     Coordination: Coordination normal.  Psychiatric:        Attention and Perception: Attention and perception normal. She does not perceive auditory or visual hallucinations.        Mood and Affect: Mood normal. Mood is not anxious or depressed. Affect is not labile, blunt, angry or inappropriate.        Speech: Speech normal.        Behavior: Behavior normal.        Thought Content: Thought content normal. Thought content is not paranoid or delusional. Thought content does not include homicidal or suicidal ideation. Thought content does not include homicidal or suicidal plan.        Cognition and Memory: Cognition and memory normal.        Judgment: Judgment normal.     Comments: Insight intact     Lab Review:     Component Value Date/Time   NA 139 05/17/2024 0957   K 4.2 05/17/2024 0957   CL 102 05/17/2024  0957   CO2 20 05/17/2024 0957   GLUCOSE 83 05/17/2024 0957   GLUCOSE 86 04/14/2024 1629   BUN 17 05/17/2024 0957   CREATININE 0.94 05/17/2024 0957   CREATININE 0.82 04/14/2024 1629   CALCIUM  9.8 05/17/2024 0957   PROT 6.8 10/01/2023 1522   PROT 6.6 01/29/2022 0909   ALBUMIN 4.3 01/29/2022 0909   AST 13  10/01/2023 1522   ALT 11 10/01/2023 1522   ALKPHOS 60 01/29/2022 0909   BILITOT 0.3 10/01/2023 1522   BILITOT 0.6 01/29/2022 0909   GFRNONAA >60 07/09/2015 0828   GFRAA >60 07/09/2015 0828       Component Value Date/Time   WBC 7.1 10/01/2023 1522   RBC 4.41 10/01/2023 1522   HGB 13.6 10/01/2023 1522   HGB 13.2 04/17/2021 1028   HCT 41.8 10/01/2023 1522   HCT 41.7 04/17/2021 1028   PLT 233 10/01/2023 1522   PLT 245 04/17/2021 1028   MCV 94.8 10/01/2023 1522   MCV 91 04/17/2021 1028   MCH 30.8 10/01/2023 1522   MCHC 32.5 10/01/2023 1522   RDW 11.8 10/01/2023 1522   RDW 13.0 04/17/2021 1028   LYMPHSABS 2.6 04/17/2021 1028   MONOABS 0.5 03/01/2014 0525   EOSABS 270 10/01/2023 1522   EOSABS 0.2 04/17/2021 1028   BASOSABS 28 10/01/2023 1522   BASOSABS 0.0 04/17/2021 1028    No results found for: POCLITH, LITHIUM   No results found for: PHENYTOIN, PHENOBARB, VALPROATE, CBMZ   .res Assessment: Plan:    Treatment Plan/Recommendations:   Plan:  PDMP reviewed  Wellbutrin  XL 300mg  daily Buspar  10mg  BID  Zoloft  100mg  daily    Valium  10mg  daily - taking once every 6 months  Refer to therapy  20 minutes spent dedicated to the care of this patient on the date of this encounter to include pre-visit review of records, ordering of medication, post visit documentation, and face-to-face time with the patient discussing depression and anxiety. Discussed continuing current medication regimen - Zoloft  50mg  started less than a month ago.  RTC 8 weeks  Patient advised to contact office with any questions, adverse effects, or acute worsening in signs and symptoms.   There are no diagnoses linked to this encounter.   Please see After Visit Summary for patient specific instructions.  Future Appointments  Date Time Provider Department Center  07/17/2024  8:00 AM Ladonna Vanorder, Angeline Mattocks, NP [facility] None  07/26/2024  8:45 AM Tobie Eldora NOVAK, MD [facility] None  10/05/2024   9:30 AM Tobie Eldora NOVAK, MD [facility] None    No orders of the defined types were placed in this encounter.     -------------------------------

## 2024-07-26 ENCOUNTER — Ambulatory Visit (INDEPENDENT_AMBULATORY_CARE_PROVIDER_SITE_OTHER): Admitting: Otolaryngology

## 2024-07-30 ENCOUNTER — Other Ambulatory Visit: Payer: Self-pay | Admitting: Family Medicine

## 2024-08-15 ENCOUNTER — Ambulatory Visit: Admitting: Psychiatry

## 2024-08-31 ENCOUNTER — Other Ambulatory Visit: Payer: Self-pay | Admitting: Adult Health

## 2024-08-31 DIAGNOSIS — F331 Major depressive disorder, recurrent, moderate: Secondary | ICD-10-CM

## 2024-08-31 DIAGNOSIS — F411 Generalized anxiety disorder: Secondary | ICD-10-CM

## 2024-09-11 ENCOUNTER — Ambulatory Visit: Admitting: Psychiatry

## 2024-09-11 ENCOUNTER — Encounter: Payer: Self-pay | Admitting: Psychiatry

## 2024-09-11 ENCOUNTER — Telehealth: Payer: Self-pay | Admitting: Psychiatry

## 2024-09-11 DIAGNOSIS — F4323 Adjustment disorder with mixed anxiety and depressed mood: Secondary | ICD-10-CM

## 2024-09-18 ENCOUNTER — Telehealth: Admitting: Adult Health

## 2024-10-05 ENCOUNTER — Encounter (INDEPENDENT_AMBULATORY_CARE_PROVIDER_SITE_OTHER): Admitting: Otolaryngology

## 2024-10-09 ENCOUNTER — Ambulatory Visit: Admitting: Psychiatry

## 2024-11-16 ENCOUNTER — Ambulatory Visit: Admitting: Psychiatry

## 2024-12-12 ENCOUNTER — Ambulatory Visit: Admitting: Psychiatry
